# Patient Record
Sex: Male | Born: 1940 | Race: White | Hispanic: No | Marital: Married | Smoking: Never smoker
Health system: Southern US, Community
[De-identification: ages and names within clinical notes are randomized; demographics above are authoritative.]

## PROBLEM LIST (undated history)

## (undated) DIAGNOSIS — I639 Cerebral infarction, unspecified: Secondary | ICD-10-CM

## (undated) DIAGNOSIS — E7800 Pure hypercholesterolemia, unspecified: Secondary | ICD-10-CM

## (undated) DIAGNOSIS — I10 Essential (primary) hypertension: Secondary | ICD-10-CM

## (undated) DIAGNOSIS — E119 Type 2 diabetes mellitus without complications: Secondary | ICD-10-CM

## (undated) DIAGNOSIS — N400 Enlarged prostate without lower urinary tract symptoms: Secondary | ICD-10-CM

## (undated) HISTORY — PX: EXPLORATORY LAPAROTOMY: SUR591

---
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
## 2016-04-22 ENCOUNTER — Other Ambulatory Visit (HOSPITAL_COMMUNITY): Payer: Self-pay | Admitting: Interventional Radiology

## 2016-04-22 DIAGNOSIS — I771 Stricture of artery: Secondary | ICD-10-CM

## 2016-04-28 ENCOUNTER — Ambulatory Visit (HOSPITAL_COMMUNITY)
Admission: RE | Admit: 2016-04-28 | Discharge: 2016-04-28 | Disposition: A | Discharge status: Home / Self Care | Payer: Medicare HMO | Source: Ambulatory Visit | Attending: Interventional Radiology | Admitting: Interventional Radiology

## 2016-04-28 DIAGNOSIS — I771 Stricture of artery: Secondary | ICD-10-CM

## 2016-04-28 HISTORY — PX: IR GENERIC HISTORICAL: IMG1180011

## 2016-05-01 ENCOUNTER — Encounter (HOSPITAL_COMMUNITY): Payer: Self-pay | Admitting: Interventional Radiology

## 2016-08-28 DIAGNOSIS — R140 Abdominal distension (gaseous): Secondary | ICD-10-CM | POA: Diagnosis not present

## 2016-08-28 DIAGNOSIS — K7689 Other specified diseases of liver: Secondary | ICD-10-CM | POA: Diagnosis not present

## 2016-08-28 DIAGNOSIS — R1314 Dysphagia, pharyngoesophageal phase: Secondary | ICD-10-CM | POA: Diagnosis not present

## 2016-09-28 DIAGNOSIS — R69 Illness, unspecified: Secondary | ICD-10-CM | POA: Diagnosis not present

## 2016-11-05 DIAGNOSIS — E669 Obesity, unspecified: Secondary | ICD-10-CM | POA: Diagnosis not present

## 2016-11-05 DIAGNOSIS — Z972 Presence of dental prosthetic device (complete) (partial): Secondary | ICD-10-CM | POA: Diagnosis not present

## 2016-11-05 DIAGNOSIS — K08409 Partial loss of teeth, unspecified cause, unspecified class: Secondary | ICD-10-CM | POA: Diagnosis not present

## 2016-11-05 DIAGNOSIS — E1122 Type 2 diabetes mellitus with diabetic chronic kidney disease: Secondary | ICD-10-CM | POA: Diagnosis not present

## 2016-11-05 DIAGNOSIS — Z0000 Encounter for general adult medical examination without abnormal findings: Secondary | ICD-10-CM | POA: Diagnosis not present

## 2016-11-05 DIAGNOSIS — I10 Essential (primary) hypertension: Secondary | ICD-10-CM | POA: Diagnosis not present

## 2016-11-05 DIAGNOSIS — Z8673 Personal history of transient ischemic attack (TIA), and cerebral infarction without residual deficits: Secondary | ICD-10-CM | POA: Diagnosis not present

## 2016-11-05 DIAGNOSIS — I779 Disorder of arteries and arterioles, unspecified: Secondary | ICD-10-CM | POA: Diagnosis not present

## 2016-11-05 DIAGNOSIS — Z789 Other specified health status: Secondary | ICD-10-CM | POA: Diagnosis not present

## 2016-11-05 DIAGNOSIS — Z7902 Long term (current) use of antithrombotics/antiplatelets: Secondary | ICD-10-CM | POA: Diagnosis not present

## 2016-11-05 DIAGNOSIS — Z7984 Long term (current) use of oral hypoglycemic drugs: Secondary | ICD-10-CM | POA: Diagnosis not present

## 2016-11-05 DIAGNOSIS — I63212 Cerebral infarction due to unspecified occlusion or stenosis of left vertebral arteries: Secondary | ICD-10-CM | POA: Diagnosis not present

## 2016-11-05 DIAGNOSIS — R972 Elevated prostate specific antigen [PSA]: Secondary | ICD-10-CM | POA: Diagnosis not present

## 2016-11-05 DIAGNOSIS — N401 Enlarged prostate with lower urinary tract symptoms: Secondary | ICD-10-CM | POA: Diagnosis not present

## 2016-11-05 DIAGNOSIS — I6322 Cerebral infarction due to unspecified occlusion or stenosis of basilar arteries: Secondary | ICD-10-CM | POA: Diagnosis not present

## 2016-11-05 DIAGNOSIS — Z7982 Long term (current) use of aspirin: Secondary | ICD-10-CM | POA: Diagnosis not present

## 2016-11-05 DIAGNOSIS — Z713 Dietary counseling and surveillance: Secondary | ICD-10-CM | POA: Diagnosis not present

## 2016-11-05 DIAGNOSIS — Z6832 Body mass index (BMI) 32.0-32.9, adult: Secondary | ICD-10-CM | POA: Diagnosis not present

## 2016-11-05 DIAGNOSIS — N182 Chronic kidney disease, stage 2 (mild): Secondary | ICD-10-CM | POA: Diagnosis not present

## 2016-11-05 DIAGNOSIS — E119 Type 2 diabetes mellitus without complications: Secondary | ICD-10-CM | POA: Diagnosis not present

## 2016-11-05 DIAGNOSIS — Z299 Encounter for prophylactic measures, unspecified: Secondary | ICD-10-CM | POA: Diagnosis not present

## 2017-01-27 DIAGNOSIS — R69 Illness, unspecified: Secondary | ICD-10-CM | POA: Diagnosis not present

## 2017-02-11 DIAGNOSIS — N400 Enlarged prostate without lower urinary tract symptoms: Secondary | ICD-10-CM | POA: Diagnosis not present

## 2017-02-11 DIAGNOSIS — E1165 Type 2 diabetes mellitus with hyperglycemia: Secondary | ICD-10-CM | POA: Diagnosis not present

## 2017-02-11 DIAGNOSIS — Z299 Encounter for prophylactic measures, unspecified: Secondary | ICD-10-CM | POA: Diagnosis not present

## 2017-02-11 DIAGNOSIS — R972 Elevated prostate specific antigen [PSA]: Secondary | ICD-10-CM | POA: Diagnosis not present

## 2017-02-11 DIAGNOSIS — R69 Illness, unspecified: Secondary | ICD-10-CM | POA: Diagnosis not present

## 2017-02-11 DIAGNOSIS — I779 Disorder of arteries and arterioles, unspecified: Secondary | ICD-10-CM | POA: Diagnosis not present

## 2017-02-11 DIAGNOSIS — I63212 Cerebral infarction due to unspecified occlusion or stenosis of left vertebral arteries: Secondary | ICD-10-CM | POA: Diagnosis not present

## 2017-02-11 DIAGNOSIS — E7800 Pure hypercholesterolemia, unspecified: Secondary | ICD-10-CM | POA: Diagnosis not present

## 2017-02-11 DIAGNOSIS — I6322 Cerebral infarction due to unspecified occlusion or stenosis of basilar arteries: Secondary | ICD-10-CM | POA: Diagnosis not present

## 2017-02-11 DIAGNOSIS — Z6831 Body mass index (BMI) 31.0-31.9, adult: Secondary | ICD-10-CM | POA: Diagnosis not present

## 2017-03-13 DIAGNOSIS — R69 Illness, unspecified: Secondary | ICD-10-CM | POA: Diagnosis not present

## 2017-04-20 DIAGNOSIS — I63212 Cerebral infarction due to unspecified occlusion or stenosis of left vertebral arteries: Secondary | ICD-10-CM | POA: Diagnosis not present

## 2017-04-20 DIAGNOSIS — R5383 Other fatigue: Secondary | ICD-10-CM | POA: Diagnosis not present

## 2017-04-20 DIAGNOSIS — E7800 Pure hypercholesterolemia, unspecified: Secondary | ICD-10-CM | POA: Diagnosis not present

## 2017-04-20 DIAGNOSIS — I779 Disorder of arteries and arterioles, unspecified: Secondary | ICD-10-CM | POA: Diagnosis not present

## 2017-04-20 DIAGNOSIS — Z125 Encounter for screening for malignant neoplasm of prostate: Secondary | ICD-10-CM | POA: Diagnosis not present

## 2017-04-20 DIAGNOSIS — Z6831 Body mass index (BMI) 31.0-31.9, adult: Secondary | ICD-10-CM | POA: Diagnosis not present

## 2017-04-20 DIAGNOSIS — Z79899 Other long term (current) drug therapy: Secondary | ICD-10-CM | POA: Diagnosis not present

## 2017-04-20 DIAGNOSIS — I6322 Cerebral infarction due to unspecified occlusion or stenosis of basilar arteries: Secondary | ICD-10-CM | POA: Diagnosis not present

## 2017-04-20 DIAGNOSIS — E1165 Type 2 diabetes mellitus with hyperglycemia: Secondary | ICD-10-CM | POA: Diagnosis not present

## 2017-04-20 DIAGNOSIS — Z1389 Encounter for screening for other disorder: Secondary | ICD-10-CM | POA: Diagnosis not present

## 2017-04-20 DIAGNOSIS — Z7189 Other specified counseling: Secondary | ICD-10-CM | POA: Diagnosis not present

## 2017-04-20 DIAGNOSIS — Z0000 Encounter for general adult medical examination without abnormal findings: Secondary | ICD-10-CM | POA: Diagnosis not present

## 2017-04-20 DIAGNOSIS — Z299 Encounter for prophylactic measures, unspecified: Secondary | ICD-10-CM | POA: Diagnosis not present

## 2017-04-22 DIAGNOSIS — J9811 Atelectasis: Secondary | ICD-10-CM | POA: Diagnosis not present

## 2017-04-22 DIAGNOSIS — K9189 Other postprocedural complications and disorders of digestive system: Secondary | ICD-10-CM | POA: Diagnosis not present

## 2017-04-22 DIAGNOSIS — Z7984 Long term (current) use of oral hypoglycemic drugs: Secondary | ICD-10-CM | POA: Diagnosis not present

## 2017-04-22 DIAGNOSIS — R1111 Vomiting without nausea: Secondary | ICD-10-CM | POA: Diagnosis not present

## 2017-04-22 DIAGNOSIS — Z7982 Long term (current) use of aspirin: Secondary | ICD-10-CM | POA: Diagnosis not present

## 2017-04-22 DIAGNOSIS — R112 Nausea with vomiting, unspecified: Secondary | ICD-10-CM | POA: Diagnosis not present

## 2017-04-22 DIAGNOSIS — Z79899 Other long term (current) drug therapy: Secondary | ICD-10-CM | POA: Diagnosis not present

## 2017-04-22 DIAGNOSIS — R140 Abdominal distension (gaseous): Secondary | ICD-10-CM | POA: Diagnosis not present

## 2017-04-22 DIAGNOSIS — N179 Acute kidney failure, unspecified: Secondary | ICD-10-CM | POA: Diagnosis not present

## 2017-04-22 DIAGNOSIS — E119 Type 2 diabetes mellitus without complications: Secondary | ICD-10-CM | POA: Diagnosis not present

## 2017-04-22 DIAGNOSIS — I10 Essential (primary) hypertension: Secondary | ICD-10-CM | POA: Diagnosis not present

## 2017-04-22 DIAGNOSIS — K56609 Unspecified intestinal obstruction, unspecified as to partial versus complete obstruction: Secondary | ICD-10-CM | POA: Diagnosis not present

## 2017-04-22 DIAGNOSIS — Z452 Encounter for adjustment and management of vascular access device: Secondary | ICD-10-CM | POA: Diagnosis not present

## 2017-04-22 DIAGNOSIS — K449 Diaphragmatic hernia without obstruction or gangrene: Secondary | ICD-10-CM | POA: Diagnosis not present

## 2017-04-22 DIAGNOSIS — R1084 Generalized abdominal pain: Secondary | ICD-10-CM | POA: Diagnosis not present

## 2017-04-22 DIAGNOSIS — K567 Ileus, unspecified: Secondary | ICD-10-CM | POA: Diagnosis not present

## 2017-04-22 DIAGNOSIS — K3189 Other diseases of stomach and duodenum: Secondary | ICD-10-CM | POA: Diagnosis not present

## 2017-04-22 DIAGNOSIS — Q430 Meckel's diverticulum (displaced) (hypertrophic): Secondary | ICD-10-CM | POA: Diagnosis not present

## 2017-04-22 DIAGNOSIS — K565 Intestinal adhesions [bands], unspecified as to partial versus complete obstruction: Secondary | ICD-10-CM | POA: Diagnosis not present

## 2017-05-09 DIAGNOSIS — I10 Essential (primary) hypertension: Secondary | ICD-10-CM | POA: Diagnosis not present

## 2017-05-09 DIAGNOSIS — Z48815 Encounter for surgical aftercare following surgery on the digestive system: Secondary | ICD-10-CM | POA: Diagnosis not present

## 2017-05-09 DIAGNOSIS — Z8673 Personal history of transient ischemic attack (TIA), and cerebral infarction without residual deficits: Secondary | ICD-10-CM | POA: Diagnosis not present

## 2017-05-09 DIAGNOSIS — Z7982 Long term (current) use of aspirin: Secondary | ICD-10-CM | POA: Diagnosis not present

## 2017-05-09 DIAGNOSIS — Q430 Meckel's diverticulum (displaced) (hypertrophic): Secondary | ICD-10-CM | POA: Diagnosis not present

## 2017-05-09 DIAGNOSIS — Z7984 Long term (current) use of oral hypoglycemic drugs: Secondary | ICD-10-CM | POA: Diagnosis not present

## 2017-05-09 DIAGNOSIS — Z79891 Long term (current) use of opiate analgesic: Secondary | ICD-10-CM | POA: Diagnosis not present

## 2017-05-10 DIAGNOSIS — Z79891 Long term (current) use of opiate analgesic: Secondary | ICD-10-CM | POA: Diagnosis not present

## 2017-05-10 DIAGNOSIS — R69 Illness, unspecified: Secondary | ICD-10-CM | POA: Diagnosis not present

## 2017-05-10 DIAGNOSIS — Z8673 Personal history of transient ischemic attack (TIA), and cerebral infarction without residual deficits: Secondary | ICD-10-CM | POA: Diagnosis not present

## 2017-05-10 DIAGNOSIS — Z7982 Long term (current) use of aspirin: Secondary | ICD-10-CM | POA: Diagnosis not present

## 2017-05-10 DIAGNOSIS — I10 Essential (primary) hypertension: Secondary | ICD-10-CM | POA: Diagnosis not present

## 2017-05-10 DIAGNOSIS — Z7984 Long term (current) use of oral hypoglycemic drugs: Secondary | ICD-10-CM | POA: Diagnosis not present

## 2017-05-10 DIAGNOSIS — Z48815 Encounter for surgical aftercare following surgery on the digestive system: Secondary | ICD-10-CM | POA: Diagnosis not present

## 2017-05-10 DIAGNOSIS — Q430 Meckel's diverticulum (displaced) (hypertrophic): Secondary | ICD-10-CM | POA: Diagnosis not present

## 2017-05-11 DIAGNOSIS — I10 Essential (primary) hypertension: Secondary | ICD-10-CM | POA: Diagnosis not present

## 2017-05-11 DIAGNOSIS — Z7982 Long term (current) use of aspirin: Secondary | ICD-10-CM | POA: Diagnosis not present

## 2017-05-11 DIAGNOSIS — Q430 Meckel's diverticulum (displaced) (hypertrophic): Secondary | ICD-10-CM | POA: Diagnosis not present

## 2017-05-11 DIAGNOSIS — Z8673 Personal history of transient ischemic attack (TIA), and cerebral infarction without residual deficits: Secondary | ICD-10-CM | POA: Diagnosis not present

## 2017-05-11 DIAGNOSIS — Z48815 Encounter for surgical aftercare following surgery on the digestive system: Secondary | ICD-10-CM | POA: Diagnosis not present

## 2017-05-11 DIAGNOSIS — Z7984 Long term (current) use of oral hypoglycemic drugs: Secondary | ICD-10-CM | POA: Diagnosis not present

## 2017-05-11 DIAGNOSIS — Z79891 Long term (current) use of opiate analgesic: Secondary | ICD-10-CM | POA: Diagnosis not present

## 2017-05-12 DIAGNOSIS — I10 Essential (primary) hypertension: Secondary | ICD-10-CM | POA: Diagnosis not present

## 2017-05-12 DIAGNOSIS — Z299 Encounter for prophylactic measures, unspecified: Secondary | ICD-10-CM | POA: Diagnosis not present

## 2017-05-12 DIAGNOSIS — K5900 Constipation, unspecified: Secondary | ICD-10-CM | POA: Diagnosis not present

## 2017-05-12 DIAGNOSIS — I63212 Cerebral infarction due to unspecified occlusion or stenosis of left vertebral arteries: Secondary | ICD-10-CM | POA: Diagnosis not present

## 2017-05-12 DIAGNOSIS — I6322 Cerebral infarction due to unspecified occlusion or stenosis of basilar arteries: Secondary | ICD-10-CM | POA: Diagnosis not present

## 2017-05-12 DIAGNOSIS — K56609 Unspecified intestinal obstruction, unspecified as to partial versus complete obstruction: Secondary | ICD-10-CM | POA: Diagnosis not present

## 2017-05-12 DIAGNOSIS — Z6829 Body mass index (BMI) 29.0-29.9, adult: Secondary | ICD-10-CM | POA: Diagnosis not present

## 2017-05-12 DIAGNOSIS — N400 Enlarged prostate without lower urinary tract symptoms: Secondary | ICD-10-CM | POA: Diagnosis not present

## 2017-05-12 DIAGNOSIS — E7800 Pure hypercholesterolemia, unspecified: Secondary | ICD-10-CM | POA: Diagnosis not present

## 2017-05-12 DIAGNOSIS — E1165 Type 2 diabetes mellitus with hyperglycemia: Secondary | ICD-10-CM | POA: Diagnosis not present

## 2017-05-19 DIAGNOSIS — Z79891 Long term (current) use of opiate analgesic: Secondary | ICD-10-CM | POA: Diagnosis not present

## 2017-05-19 DIAGNOSIS — Z7982 Long term (current) use of aspirin: Secondary | ICD-10-CM | POA: Diagnosis not present

## 2017-05-19 DIAGNOSIS — Z7984 Long term (current) use of oral hypoglycemic drugs: Secondary | ICD-10-CM | POA: Diagnosis not present

## 2017-05-19 DIAGNOSIS — Z48815 Encounter for surgical aftercare following surgery on the digestive system: Secondary | ICD-10-CM | POA: Diagnosis not present

## 2017-05-19 DIAGNOSIS — Z8673 Personal history of transient ischemic attack (TIA), and cerebral infarction without residual deficits: Secondary | ICD-10-CM | POA: Diagnosis not present

## 2017-05-19 DIAGNOSIS — I10 Essential (primary) hypertension: Secondary | ICD-10-CM | POA: Diagnosis not present

## 2017-05-19 DIAGNOSIS — Q430 Meckel's diverticulum (displaced) (hypertrophic): Secondary | ICD-10-CM | POA: Diagnosis not present

## 2017-06-14 DIAGNOSIS — I10 Essential (primary) hypertension: Secondary | ICD-10-CM | POA: Diagnosis not present

## 2017-06-14 DIAGNOSIS — B029 Zoster without complications: Secondary | ICD-10-CM | POA: Diagnosis not present

## 2017-06-14 DIAGNOSIS — Z299 Encounter for prophylactic measures, unspecified: Secondary | ICD-10-CM | POA: Diagnosis not present

## 2017-06-14 DIAGNOSIS — E1165 Type 2 diabetes mellitus with hyperglycemia: Secondary | ICD-10-CM | POA: Diagnosis not present

## 2017-06-14 DIAGNOSIS — Z6828 Body mass index (BMI) 28.0-28.9, adult: Secondary | ICD-10-CM | POA: Diagnosis not present

## 2017-07-24 DIAGNOSIS — R69 Illness, unspecified: Secondary | ICD-10-CM | POA: Diagnosis not present

## 2017-08-17 DIAGNOSIS — R531 Weakness: Secondary | ICD-10-CM

## 2017-08-17 DIAGNOSIS — I639 Cerebral infarction, unspecified: Secondary | ICD-10-CM

## 2017-08-17 HISTORY — DX: Cerebral infarction, unspecified: I63.9

## 2017-09-02 DIAGNOSIS — Z809 Family history of malignant neoplasm, unspecified: Secondary | ICD-10-CM | POA: Diagnosis not present

## 2017-09-02 DIAGNOSIS — Z7982 Long term (current) use of aspirin: Secondary | ICD-10-CM | POA: Diagnosis not present

## 2017-09-02 DIAGNOSIS — E119 Type 2 diabetes mellitus without complications: Secondary | ICD-10-CM | POA: Diagnosis not present

## 2017-09-02 DIAGNOSIS — Z7902 Long term (current) use of antithrombotics/antiplatelets: Secondary | ICD-10-CM | POA: Diagnosis not present

## 2017-09-02 DIAGNOSIS — Z833 Family history of diabetes mellitus: Secondary | ICD-10-CM | POA: Diagnosis not present

## 2017-09-02 DIAGNOSIS — E785 Hyperlipidemia, unspecified: Secondary | ICD-10-CM | POA: Diagnosis not present

## 2017-09-02 DIAGNOSIS — I10 Essential (primary) hypertension: Secondary | ICD-10-CM | POA: Diagnosis not present

## 2017-09-02 DIAGNOSIS — N400 Enlarged prostate without lower urinary tract symptoms: Secondary | ICD-10-CM | POA: Diagnosis not present

## 2017-09-02 DIAGNOSIS — R69 Illness, unspecified: Secondary | ICD-10-CM | POA: Diagnosis not present

## 2017-09-02 DIAGNOSIS — Z7984 Long term (current) use of oral hypoglycemic drugs: Secondary | ICD-10-CM | POA: Diagnosis not present

## 2017-09-20 DIAGNOSIS — I779 Disorder of arteries and arterioles, unspecified: Secondary | ICD-10-CM | POA: Diagnosis not present

## 2017-09-20 DIAGNOSIS — E1165 Type 2 diabetes mellitus with hyperglycemia: Secondary | ICD-10-CM | POA: Diagnosis not present

## 2017-09-20 DIAGNOSIS — K56609 Unspecified intestinal obstruction, unspecified as to partial versus complete obstruction: Secondary | ICD-10-CM | POA: Diagnosis not present

## 2017-09-20 DIAGNOSIS — Z6829 Body mass index (BMI) 29.0-29.9, adult: Secondary | ICD-10-CM | POA: Diagnosis not present

## 2017-09-20 DIAGNOSIS — Z299 Encounter for prophylactic measures, unspecified: Secondary | ICD-10-CM | POA: Diagnosis not present

## 2017-09-20 DIAGNOSIS — B029 Zoster without complications: Secondary | ICD-10-CM | POA: Diagnosis not present

## 2017-09-22 DIAGNOSIS — R69 Illness, unspecified: Secondary | ICD-10-CM | POA: Diagnosis not present

## 2017-11-04 DIAGNOSIS — R69 Illness, unspecified: Secondary | ICD-10-CM | POA: Diagnosis not present

## 2017-12-23 DIAGNOSIS — R69 Illness, unspecified: Secondary | ICD-10-CM | POA: Diagnosis not present

## 2017-12-27 DIAGNOSIS — I779 Disorder of arteries and arterioles, unspecified: Secondary | ICD-10-CM | POA: Diagnosis not present

## 2017-12-27 DIAGNOSIS — E1165 Type 2 diabetes mellitus with hyperglycemia: Secondary | ICD-10-CM | POA: Diagnosis not present

## 2017-12-27 DIAGNOSIS — K56609 Unspecified intestinal obstruction, unspecified as to partial versus complete obstruction: Secondary | ICD-10-CM | POA: Diagnosis not present

## 2017-12-27 DIAGNOSIS — I10 Essential (primary) hypertension: Secondary | ICD-10-CM | POA: Diagnosis not present

## 2017-12-27 DIAGNOSIS — Z299 Encounter for prophylactic measures, unspecified: Secondary | ICD-10-CM | POA: Diagnosis not present

## 2017-12-27 DIAGNOSIS — Z6829 Body mass index (BMI) 29.0-29.9, adult: Secondary | ICD-10-CM | POA: Diagnosis not present

## 2017-12-28 DIAGNOSIS — R69 Illness, unspecified: Secondary | ICD-10-CM | POA: Diagnosis not present

## 2018-01-06 DIAGNOSIS — H52221 Regular astigmatism, right eye: Secondary | ICD-10-CM | POA: Diagnosis not present

## 2018-01-06 DIAGNOSIS — H524 Presbyopia: Secondary | ICD-10-CM | POA: Diagnosis not present

## 2018-01-06 DIAGNOSIS — H02403 Unspecified ptosis of bilateral eyelids: Secondary | ICD-10-CM | POA: Diagnosis not present

## 2018-01-06 DIAGNOSIS — E119 Type 2 diabetes mellitus without complications: Secondary | ICD-10-CM | POA: Diagnosis not present

## 2018-01-06 DIAGNOSIS — H5212 Myopia, left eye: Secondary | ICD-10-CM | POA: Diagnosis not present

## 2018-01-06 DIAGNOSIS — H2513 Age-related nuclear cataract, bilateral: Secondary | ICD-10-CM | POA: Diagnosis not present

## 2018-01-06 DIAGNOSIS — H25013 Cortical age-related cataract, bilateral: Secondary | ICD-10-CM | POA: Diagnosis not present

## 2018-01-06 DIAGNOSIS — Z7984 Long term (current) use of oral hypoglycemic drugs: Secondary | ICD-10-CM | POA: Diagnosis not present

## 2018-01-06 DIAGNOSIS — H25813 Combined forms of age-related cataract, bilateral: Secondary | ICD-10-CM | POA: Diagnosis not present

## 2018-02-07 DIAGNOSIS — R69 Illness, unspecified: Secondary | ICD-10-CM | POA: Diagnosis not present

## 2018-02-24 DIAGNOSIS — H25813 Combined forms of age-related cataract, bilateral: Secondary | ICD-10-CM | POA: Diagnosis not present

## 2018-02-24 DIAGNOSIS — E113313 Type 2 diabetes mellitus with moderate nonproliferative diabetic retinopathy with macular edema, bilateral: Secondary | ICD-10-CM | POA: Diagnosis not present

## 2018-03-02 DIAGNOSIS — R69 Illness, unspecified: Secondary | ICD-10-CM | POA: Diagnosis not present

## 2018-03-08 DIAGNOSIS — R69 Illness, unspecified: Secondary | ICD-10-CM | POA: Diagnosis not present

## 2018-03-14 DIAGNOSIS — H35033 Hypertensive retinopathy, bilateral: Secondary | ICD-10-CM | POA: Diagnosis not present

## 2018-03-14 DIAGNOSIS — E113413 Type 2 diabetes mellitus with severe nonproliferative diabetic retinopathy with macular edema, bilateral: Secondary | ICD-10-CM | POA: Diagnosis not present

## 2018-03-14 DIAGNOSIS — H3563 Retinal hemorrhage, bilateral: Secondary | ICD-10-CM | POA: Diagnosis not present

## 2018-03-14 DIAGNOSIS — H43813 Vitreous degeneration, bilateral: Secondary | ICD-10-CM | POA: Diagnosis not present

## 2018-03-21 DIAGNOSIS — R69 Illness, unspecified: Secondary | ICD-10-CM | POA: Diagnosis not present

## 2018-03-31 DIAGNOSIS — R69 Illness, unspecified: Secondary | ICD-10-CM | POA: Diagnosis not present

## 2018-04-01 DIAGNOSIS — E113413 Type 2 diabetes mellitus with severe nonproliferative diabetic retinopathy with macular edema, bilateral: Secondary | ICD-10-CM | POA: Diagnosis not present

## 2018-04-04 DIAGNOSIS — E1165 Type 2 diabetes mellitus with hyperglycemia: Secondary | ICD-10-CM | POA: Diagnosis not present

## 2018-04-04 DIAGNOSIS — I779 Disorder of arteries and arterioles, unspecified: Secondary | ICD-10-CM | POA: Diagnosis not present

## 2018-04-04 DIAGNOSIS — Z6830 Body mass index (BMI) 30.0-30.9, adult: Secondary | ICD-10-CM | POA: Diagnosis not present

## 2018-04-04 DIAGNOSIS — Z299 Encounter for prophylactic measures, unspecified: Secondary | ICD-10-CM | POA: Diagnosis not present

## 2018-04-04 DIAGNOSIS — I10 Essential (primary) hypertension: Secondary | ICD-10-CM | POA: Diagnosis not present

## 2018-04-04 DIAGNOSIS — E7800 Pure hypercholesterolemia, unspecified: Secondary | ICD-10-CM | POA: Diagnosis not present

## 2018-04-25 DIAGNOSIS — Z299 Encounter for prophylactic measures, unspecified: Secondary | ICD-10-CM | POA: Diagnosis not present

## 2018-04-25 DIAGNOSIS — Z125 Encounter for screening for malignant neoplasm of prostate: Secondary | ICD-10-CM | POA: Diagnosis not present

## 2018-04-25 DIAGNOSIS — Z1339 Encounter for screening examination for other mental health and behavioral disorders: Secondary | ICD-10-CM | POA: Diagnosis not present

## 2018-04-25 DIAGNOSIS — Z7189 Other specified counseling: Secondary | ICD-10-CM | POA: Diagnosis not present

## 2018-04-25 DIAGNOSIS — Z1211 Encounter for screening for malignant neoplasm of colon: Secondary | ICD-10-CM | POA: Diagnosis not present

## 2018-04-25 DIAGNOSIS — E7800 Pure hypercholesterolemia, unspecified: Secondary | ICD-10-CM | POA: Diagnosis not present

## 2018-04-25 DIAGNOSIS — Z79899 Other long term (current) drug therapy: Secondary | ICD-10-CM | POA: Diagnosis not present

## 2018-04-25 DIAGNOSIS — R5383 Other fatigue: Secondary | ICD-10-CM | POA: Diagnosis not present

## 2018-04-25 DIAGNOSIS — Z6829 Body mass index (BMI) 29.0-29.9, adult: Secondary | ICD-10-CM | POA: Diagnosis not present

## 2018-04-25 DIAGNOSIS — Z0000 Encounter for general adult medical examination without abnormal findings: Secondary | ICD-10-CM | POA: Diagnosis not present

## 2018-04-25 DIAGNOSIS — I10 Essential (primary) hypertension: Secondary | ICD-10-CM | POA: Diagnosis not present

## 2018-04-25 DIAGNOSIS — Z1331 Encounter for screening for depression: Secondary | ICD-10-CM | POA: Diagnosis not present

## 2018-04-29 DIAGNOSIS — H3563 Retinal hemorrhage, bilateral: Secondary | ICD-10-CM | POA: Diagnosis not present

## 2018-04-29 DIAGNOSIS — E113413 Type 2 diabetes mellitus with severe nonproliferative diabetic retinopathy with macular edema, bilateral: Secondary | ICD-10-CM | POA: Diagnosis not present

## 2018-04-29 DIAGNOSIS — H3582 Retinal ischemia: Secondary | ICD-10-CM | POA: Diagnosis not present

## 2018-04-29 DIAGNOSIS — H35033 Hypertensive retinopathy, bilateral: Secondary | ICD-10-CM | POA: Diagnosis not present

## 2018-05-02 DIAGNOSIS — I10 Essential (primary) hypertension: Secondary | ICD-10-CM | POA: Diagnosis not present

## 2018-05-02 DIAGNOSIS — Z713 Dietary counseling and surveillance: Secondary | ICD-10-CM | POA: Diagnosis not present

## 2018-05-02 DIAGNOSIS — R972 Elevated prostate specific antigen [PSA]: Secondary | ICD-10-CM | POA: Diagnosis not present

## 2018-05-02 DIAGNOSIS — Z299 Encounter for prophylactic measures, unspecified: Secondary | ICD-10-CM | POA: Diagnosis not present

## 2018-05-02 DIAGNOSIS — Z6830 Body mass index (BMI) 30.0-30.9, adult: Secondary | ICD-10-CM | POA: Diagnosis not present

## 2018-05-09 DIAGNOSIS — R69 Illness, unspecified: Secondary | ICD-10-CM | POA: Diagnosis not present

## 2018-05-17 DIAGNOSIS — R69 Illness, unspecified: Secondary | ICD-10-CM | POA: Diagnosis not present

## 2018-05-27 DIAGNOSIS — H3563 Retinal hemorrhage, bilateral: Secondary | ICD-10-CM | POA: Diagnosis not present

## 2018-05-27 DIAGNOSIS — H35033 Hypertensive retinopathy, bilateral: Secondary | ICD-10-CM | POA: Diagnosis not present

## 2018-05-27 DIAGNOSIS — E113413 Type 2 diabetes mellitus with severe nonproliferative diabetic retinopathy with macular edema, bilateral: Secondary | ICD-10-CM | POA: Diagnosis not present

## 2018-05-27 DIAGNOSIS — H3582 Retinal ischemia: Secondary | ICD-10-CM | POA: Diagnosis not present

## 2018-06-21 ENCOUNTER — Ambulatory Visit: Payer: Medicare HMO | Admitting: Urology

## 2018-06-21 DIAGNOSIS — R3912 Poor urinary stream: Secondary | ICD-10-CM

## 2018-06-21 DIAGNOSIS — R972 Elevated prostate specific antigen [PSA]: Secondary | ICD-10-CM

## 2018-06-21 DIAGNOSIS — N401 Enlarged prostate with lower urinary tract symptoms: Secondary | ICD-10-CM

## 2018-06-27 DIAGNOSIS — R69 Illness, unspecified: Secondary | ICD-10-CM | POA: Diagnosis not present

## 2018-07-06 DIAGNOSIS — H35033 Hypertensive retinopathy, bilateral: Secondary | ICD-10-CM | POA: Diagnosis not present

## 2018-07-06 DIAGNOSIS — E113413 Type 2 diabetes mellitus with severe nonproliferative diabetic retinopathy with macular edema, bilateral: Secondary | ICD-10-CM | POA: Diagnosis not present

## 2018-07-06 DIAGNOSIS — H43813 Vitreous degeneration, bilateral: Secondary | ICD-10-CM | POA: Diagnosis not present

## 2018-07-06 DIAGNOSIS — H3582 Retinal ischemia: Secondary | ICD-10-CM | POA: Diagnosis not present

## 2018-07-11 DIAGNOSIS — I10 Essential (primary) hypertension: Secondary | ICD-10-CM | POA: Diagnosis not present

## 2018-07-11 DIAGNOSIS — Z6830 Body mass index (BMI) 30.0-30.9, adult: Secondary | ICD-10-CM | POA: Diagnosis not present

## 2018-07-11 DIAGNOSIS — Z299 Encounter for prophylactic measures, unspecified: Secondary | ICD-10-CM | POA: Diagnosis not present

## 2018-07-11 DIAGNOSIS — R972 Elevated prostate specific antigen [PSA]: Secondary | ICD-10-CM | POA: Diagnosis not present

## 2018-07-11 DIAGNOSIS — E1165 Type 2 diabetes mellitus with hyperglycemia: Secondary | ICD-10-CM | POA: Diagnosis not present

## 2018-08-15 DIAGNOSIS — R69 Illness, unspecified: Secondary | ICD-10-CM | POA: Diagnosis not present

## 2018-08-29 DIAGNOSIS — R69 Illness, unspecified: Secondary | ICD-10-CM | POA: Diagnosis not present

## 2018-08-31 DIAGNOSIS — H35372 Puckering of macula, left eye: Secondary | ICD-10-CM | POA: Diagnosis not present

## 2018-08-31 DIAGNOSIS — E113413 Type 2 diabetes mellitus with severe nonproliferative diabetic retinopathy with macular edema, bilateral: Secondary | ICD-10-CM | POA: Diagnosis not present

## 2018-08-31 DIAGNOSIS — H35033 Hypertensive retinopathy, bilateral: Secondary | ICD-10-CM | POA: Diagnosis not present

## 2018-08-31 DIAGNOSIS — H43813 Vitreous degeneration, bilateral: Secondary | ICD-10-CM | POA: Diagnosis not present

## 2018-09-08 DIAGNOSIS — E113413 Type 2 diabetes mellitus with severe nonproliferative diabetic retinopathy with macular edema, bilateral: Secondary | ICD-10-CM | POA: Diagnosis not present

## 2018-09-08 DIAGNOSIS — H25813 Combined forms of age-related cataract, bilateral: Secondary | ICD-10-CM | POA: Diagnosis not present

## 2018-09-13 DIAGNOSIS — R69 Illness, unspecified: Secondary | ICD-10-CM | POA: Diagnosis not present

## 2018-10-04 DIAGNOSIS — R69 Illness, unspecified: Secondary | ICD-10-CM | POA: Diagnosis not present

## 2018-10-13 DIAGNOSIS — H2512 Age-related nuclear cataract, left eye: Secondary | ICD-10-CM | POA: Diagnosis not present

## 2018-10-17 ENCOUNTER — Encounter (HOSPITAL_COMMUNITY): Payer: Self-pay

## 2018-10-17 ENCOUNTER — Other Ambulatory Visit: Payer: Self-pay

## 2018-10-17 ENCOUNTER — Encounter (HOSPITAL_COMMUNITY)
Admission: RE | Admit: 2018-10-17 | Discharge: 2018-10-17 | Disposition: A | Discharge status: Home / Self Care | Payer: Medicare HMO | Source: Ambulatory Visit | Attending: Ophthalmology | Admitting: Ophthalmology

## 2018-10-17 DIAGNOSIS — Z01812 Encounter for preprocedural laboratory examination: Secondary | ICD-10-CM | POA: Diagnosis not present

## 2018-10-17 HISTORY — DX: Pure hypercholesterolemia, unspecified: E78.00

## 2018-10-17 HISTORY — DX: Essential (primary) hypertension: I10

## 2018-10-17 HISTORY — DX: Benign prostatic hyperplasia without lower urinary tract symptoms: N40.0

## 2018-10-17 HISTORY — DX: Type 2 diabetes mellitus without complications: E11.9

## 2018-10-17 LAB — BASIC METABOLIC PANEL
Anion gap: 7 (ref 5–15)
BUN: 26 mg/dL — AB (ref 8–23)
CO2: 23 mmol/L (ref 22–32)
Calcium: 8.9 mg/dL (ref 8.9–10.3)
Chloride: 107 mmol/L (ref 98–111)
Creatinine, Ser: 1.26 mg/dL — ABNORMAL HIGH (ref 0.61–1.24)
GFR calc Af Amer: >60 mL/min (ref >60)
GFR calc non Af Amer: 54 mL/min — ABNORMAL LOW (ref >60)
Glucose, Bld: 203 mg/dL — ABNORMAL HIGH (ref 70–99)
Potassium: 4.1 mmol/L (ref 3.5–5.1)
Sodium: 137 mmol/L (ref 135–145)

## 2018-10-17 NOTE — Patient Instructions (Signed)
Your procedure is scheduled on: 10/21/2018  Report to [facility] at  74   AM.  Call this number if you have problems the morning of surgery: [phone]   Do not eat food or drink liquids :After Midnight.      Take these medicines the morning of surgery with A SIP OF WATER: amlodipine  Do not wear jewelry, make-up or nail polish.  Do not wear lotions, powders, or perfumes. You may wear deodorant.  Do not shave 48 hours prior to surgery.  Do not bring valuables to the hospital.  Contacts, dentures or bridgework may not be worn into surgery.  Leave suitcase in the car. After surgery it may be brought to your room.  For patients admitted to the hospital, checkout time is 11:00 AM the day of discharge.   Patients discharged the day of surgery will not be allowed to drive home.  :     Please read over the following fact sheets that you were given: Coughing and Deep Breathing, Surgical Site Infection Prevention, Anesthesia Post-op Instructions and Care and Recovery After Surgery    Cataract A cataract is a clouding of the lens of the eye. When a lens becomes cloudy, vision is reduced based on the degree and nature of the clouding. Many cataracts reduce vision to some degree. Some cataracts make people more near-sighted as they develop. Other cataracts increase glare. Cataracts that are ignored and become worse can sometimes look white. The white color can be seen through the pupil. CAUSES   Aging. However, cataracts may occur at any age, even in newborns.   Certain drugs.   Trauma to the eye.   Certain diseases such as diabetes.   Specific eye diseases such as chronic inflammation inside the eye or a sudden attack of a rare form of glaucoma.   Inherited or acquired medical problems.  SYMPTOMS   Gradual, progressive drop in vision in the affected eye.   Severe, rapid visual loss. This most often happens when trauma is the cause.  DIAGNOSIS  To detect a cataract, an eye doctor  examines the lens. Cataracts are best diagnosed with an exam of the eyes with the pupils enlarged (dilated) by drops.  TREATMENT  For an early cataract, vision may improve by using different eyeglasses or stronger lighting. If that does not help your vision, surgery is the only effective treatment. A cataract needs to be surgically removed when vision loss interferes with your everyday activities, such as driving, reading, or watching TV. A cataract may also have to be removed if it prevents examination or treatment of another eye problem. Surgery removes the cloudy lens and usually replaces it with a substitute lens (intraocular lens, IOL).  At a time when both you and your doctor agree, the cataract will be surgically removed. If you have cataracts in both eyes, only one is usually removed at a time. This allows the operated eye to heal and be out of danger from any possible problems after surgery (such as infection or poor wound healing). In rare cases, a cataract may be doing damage to your eye. In these cases, your caregiver may advise surgical removal right away. The vast majority of people who have cataract surgery have better vision afterward. HOME CARE INSTRUCTIONS  If you are not planning surgery, you may be asked to do the following:  Use different eyeglasses.   Use stronger or brighter lighting.   Ask your eye doctor about reducing your medicine dose or  changing medicines if it is thought that a medicine caused your cataract. Changing medicines does not make the cataract go away on its own.   Become familiar with your surroundings. Poor vision can lead to injury. Avoid bumping into things on the affected side. You are at a higher risk for tripping or falling.   Exercise extreme care when driving or operating machinery.   Wear sunglasses if you are sensitive to bright light or experiencing problems with glare.  SEEK IMMEDIATE MEDICAL CARE IF:   You have a worsening or sudden vision  loss.   You notice redness, swelling, or increasing pain in the eye.   You have a fever.  Document Released: 08/03/2005 Document Revised: 07/23/2011 Document Reviewed: 03/27/2011 [facility] Patient Information 2012 [place].PATIENT INSTRUCTIONS POST-ANESTHESIA  IMMEDIATELY FOLLOWING SURGERY:  Do not drive or operate machinery for the first twenty four hours after surgery.  Do not make any important decisions for twenty four hours after surgery or while taking narcotic pain medications or sedatives.  If you develop intractable nausea and vomiting or a severe headache please notify your doctor immediately.  FOLLOW-UP:  Please make an appointment with your surgeon as instructed. You do not need to follow up with anesthesia unless specifically instructed to do so.  WOUND CARE INSTRUCTIONS (if applicable):  Keep a dry clean dressing on the anesthesia/puncture wound site if there is drainage.  Once the wound has quit draining you may leave it open to air.  Generally you should leave the bandage intact for twenty four hours unless there is drainage.  If the epidural site drains for more than 36-48 hours please call the anesthesia department.  QUESTIONS?:  Please feel free to call your physician or the hospital operator if you have any questions, and they will be happy to assist you.

## 2018-10-21 ENCOUNTER — Encounter (HOSPITAL_COMMUNITY)
Admission: RE | Disposition: A | Discharge status: Home / Self Care | Payer: Self-pay | Source: Home / Self Care | Attending: Ophthalmology

## 2018-10-21 ENCOUNTER — Ambulatory Visit (HOSPITAL_COMMUNITY): Payer: Medicare HMO | Admitting: Anesthesiology

## 2018-10-21 ENCOUNTER — Ambulatory Visit (HOSPITAL_COMMUNITY)
Admission: RE | Admit: 2018-10-21 | Discharge: 2018-10-21 | Disposition: A | Discharge status: Home / Self Care | Payer: Medicare HMO | Attending: Ophthalmology | Admitting: Ophthalmology

## 2018-10-21 ENCOUNTER — Encounter (HOSPITAL_COMMUNITY): Payer: Self-pay | Admitting: *Deleted

## 2018-10-21 DIAGNOSIS — E1136 Type 2 diabetes mellitus with diabetic cataract: Secondary | ICD-10-CM | POA: Diagnosis present

## 2018-10-21 DIAGNOSIS — I69398 Other sequelae of cerebral infarction: Secondary | ICD-10-CM | POA: Insufficient documentation

## 2018-10-21 DIAGNOSIS — Z7984 Long term (current) use of oral hypoglycemic drugs: Secondary | ICD-10-CM | POA: Diagnosis not present

## 2018-10-21 DIAGNOSIS — H259 Unspecified age-related cataract: Secondary | ICD-10-CM | POA: Diagnosis not present

## 2018-10-21 DIAGNOSIS — N400 Enlarged prostate without lower urinary tract symptoms: Secondary | ICD-10-CM | POA: Insufficient documentation

## 2018-10-21 DIAGNOSIS — Z79899 Other long term (current) drug therapy: Secondary | ICD-10-CM | POA: Diagnosis not present

## 2018-10-21 DIAGNOSIS — Z7982 Long term (current) use of aspirin: Secondary | ICD-10-CM | POA: Insufficient documentation

## 2018-10-21 DIAGNOSIS — R278 Other lack of coordination: Secondary | ICD-10-CM | POA: Insufficient documentation

## 2018-10-21 DIAGNOSIS — I10 Essential (primary) hypertension: Secondary | ICD-10-CM | POA: Insufficient documentation

## 2018-10-21 DIAGNOSIS — E119 Type 2 diabetes mellitus without complications: Secondary | ICD-10-CM | POA: Diagnosis not present

## 2018-10-21 DIAGNOSIS — H2512 Age-related nuclear cataract, left eye: Secondary | ICD-10-CM | POA: Diagnosis not present

## 2018-10-21 HISTORY — PX: CATARACT EXTRACTION W/PHACO: SHX586

## 2018-10-21 LAB — GLUCOSE, CAPILLARY: Glucose-Capillary: 123 mg/dL — ABNORMAL HIGH (ref 70–99)

## 2018-10-21 SURGERY — PHACOEMULSIFICATION, CATARACT, WITH IOL INSERTION
Anesthesia: Monitor Anesthesia Care | Site: Eye | Laterality: Left

## 2018-10-21 MED ORDER — NEOMYCIN-POLYMYXIN-DEXAMETH 3.5-10000-0.1 OP SUSP
OPHTHALMIC | Status: DC | PRN
  Administered 2018-10-21: 1 [drp] via OPHTHALMIC

## 2018-10-21 MED ORDER — HYDROCODONE-ACETAMINOPHEN 7.5-325 MG PO TABS: 1.0000 | ORAL_TABLET | Freq: Once | ORAL | Status: DC | PRN

## 2018-10-21 MED ORDER — CYCLOPENTOLATE-PHENYLEPHRINE 0.2-1 % OP SOLN
1.0000 [drp] | OPHTHALMIC | Status: AC
  Administered 2018-10-21 (×3): 1 [drp] via OPHTHALMIC

## 2018-10-21 MED ORDER — HYDROMORPHONE HCL 1 MG/ML IJ SOLN: 0.2500 mg | INTRAMUSCULAR | Status: DC | PRN

## 2018-10-21 MED ORDER — SODIUM HYALURONATE 23 MG/ML IO SOLN
INTRAOCULAR | Status: DC | PRN
  Administered 2018-10-21: 0.6 mL via INTRAOCULAR

## 2018-10-21 MED ORDER — PROMETHAZINE HCL 25 MG/ML IJ SOLN: 6.2500 mg | INTRAMUSCULAR | Status: DC | PRN

## 2018-10-21 MED ORDER — MIDAZOLAM HCL 2 MG/2ML IJ SOLN: 0.5000 mg | Freq: Once | INTRAMUSCULAR | Status: DC | PRN

## 2018-10-21 MED ORDER — LIDOCAINE HCL 3.5 % OP GEL
1.0000 "application " | Freq: Once | OPHTHALMIC | Status: AC
  Administered 2018-10-21: 1 via OPHTHALMIC

## 2018-10-21 MED ORDER — SODIUM CHLORIDE 0.9% FLUSH
10.0000 mL | INTRAVENOUS | Status: DC | PRN
  Administered 2018-10-21: 3 mL via INTRAVENOUS

## 2018-10-21 MED ORDER — PHENYLEPHRINE HCL 2.5 % OP SOLN
1.0000 [drp] | OPHTHALMIC | Status: AC
  Administered 2018-10-21 (×3): 1 [drp] via OPHTHALMIC

## 2018-10-21 MED ORDER — BSS IO SOLN
INTRAOCULAR | Status: DC | PRN
  Administered 2018-10-21: 15 mL

## 2018-10-21 MED ORDER — EPINEPHRINE PF 1 MG/ML IJ SOLN
INTRAMUSCULAR | Status: AC
  Filled 2018-10-21: qty 2

## 2018-10-21 MED ORDER — POVIDONE-IODINE 5 % OP SOLN
OPHTHALMIC | Status: DC | PRN
  Administered 2018-10-21: 1 via OPHTHALMIC

## 2018-10-21 MED ORDER — LIDOCAINE HCL (PF) 1 % IJ SOLN
INTRAOCULAR | Status: DC | PRN
  Administered 2018-10-21: 1 mL via OPHTHALMIC

## 2018-10-21 MED ORDER — PROVISC 10 MG/ML IO SOLN
INTRAOCULAR | Status: DC | PRN
  Administered 2018-10-21: 0.85 mL via INTRAOCULAR

## 2018-10-21 MED ORDER — EPINEPHRINE PF 1 MG/ML IJ SOLN
INTRAOCULAR | Status: DC | PRN
  Administered 2018-10-21: 500 mL

## 2018-10-21 MED ORDER — TETRACAINE HCL 0.5 % OP SOLN
1.0000 [drp] | OPHTHALMIC | Status: AC
  Administered 2018-10-21 (×3): 1 [drp] via OPHTHALMIC

## 2018-10-21 SURGICAL SUPPLY — 13 items

## 2018-10-21 NOTE — Discharge Instructions (Signed)
Please discharge patient when stable, will follow up today with Dr. Lakima Dona at the [facility] office immediately following discharge.  Leave shield in place until visit.  All paperwork with discharge instructions will be given at the office. ° ° ° ° ° °Monitored Anesthesia Care, Care After °These instructions provide you with information about caring for yourself after your procedure. Your health care provider may also give you more specific instructions. Your treatment has been planned according to current medical practices, but problems sometimes occur. Call your health care provider if you have any problems or questions after your procedure. °What can I expect after the procedure? °After your procedure, you may: °· Feel sleepy for several hours. °· Feel clumsy and have poor balance for several hours. °· Feel forgetful about what happened after the procedure. °· Have poor judgment for several hours. °· Feel nauseous or vomit. °· Have a sore throat if you had a breathing tube during the procedure. °Follow these instructions at home: °For at least 24 hours after the procedure: ° °  ° °· Have a responsible adult stay with you. It is important to have someone help care for you until you are awake and alert. °· Rest as needed. °· Do not: °? Participate in activities in which you could fall or become injured. °? Drive. °? Use heavy machinery. °? Drink alcohol. °? Take sleeping pills or medicines that cause drowsiness. °? Make important decisions or sign legal documents. °? Take care of children on your own. °Eating and drinking °· Follow the diet that is recommended by your health care provider. °· If you vomit, drink water, juice, or soup when you can drink without vomiting. °· Make sure you have little or no nausea before eating solid foods. °General instructions °· Take over-the-counter and prescription medicines only as told by your health care provider. °· If you have sleep apnea, surgery and certain  medicines can increase your risk for breathing problems. Follow instructions from your health care provider about wearing your sleep device: °? Anytime you are sleeping, including during daytime naps. °? While taking prescription pain medicines, sleeping medicines, or medicines that make you drowsy. °· If you smoke, do not smoke without supervision. °· Keep all follow-up visits as told by your health care provider. This is important. °Contact a health care provider if: °· You keep feeling nauseous or you keep vomiting. °· You feel light-headed. °· You develop a rash. °· You have a fever. °Get help right away if: °· You have trouble breathing. °Summary °· For several hours after your procedure, you may feel sleepy and have poor judgment. °· Have a responsible adult stay with you for at least 24 hours or until you are awake and alert. °This information is not intended to replace advice given to you by your health care provider. Make sure you discuss any questions you have with your health care provider. °Document Released: 11/24/2015 Document Revised: 03/19/2017 Document Reviewed: 11/24/2015 °Elsevier Interactive Patient Education © 2019 [employer] ° °

## 2018-10-21 NOTE — Op Note (Signed)
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 Kaidence Callaway, MD, MA  Anesthesia: MAC, Topical Akten  Complications: None  Estimated Blood Loss: <6m (minimal)  Specimens: None  Implants: As above  Indications:  Visually significant age-related cataract, Left Eye  Procedure:  The patient was seen and identified in the pre-operative area. The operative eye was identified and dilated.  The operative eye was marked.  Topical anesthesia was administered to the operative eye.     The patient was then to the operative suite and placed in the supine position.  A timeout was performed confirming the patient, procedure to be performed, and all other relevant information.   The patient's face was prepped and draped in the usual fashion for intra-ocular surgery.  A lid speculum was placed into the operative eye and the surgical microscope moved into place and focused.  An inferotemporal paracentesis was created using a 20 gauge paracentesis blade.  Shugarcaine was injected into the anterior chamber.  Viscoelastic was injected into the anterior chamber.  A temporal clear-corneal main wound incision was created using a 2.426mmicrokeratome.  A continuous curvilinear capsulorrhexis was initiated using an irrigating cystitome and completed using capsulorrhexis forceps.  Hydrodissection and hydrodeliniation were performed.  Viscoelastic was injected into the anterior chamber.  A phacoemulsification handpiece and a chopper as a second instrument were used to remove the nucleus and epinucleus. The irrigation/aspiration handpiece was used to remove any remaining cortical  material.   The capsular bag was reinflated with viscoelastic, checked, and found to be intact.  The intraocular lens was inserted into the capsular bag and dialed into place using a Kuglen hook.  The irrigation/aspiration handpiece was used to remove any remaining viscoelastic.  The clear corneal wound and paracentesis wounds were then hydrated and checked with Weck-Cels to be watertight.  The lid-speculum and drape was removed, and the patient's face was cleaned with a wet and dry 4x4.  Maxitrol was instilled in the eye before a clear shield was taped over the eye. The patient was taken to the post-operative care unit in good condition, having tolerated the procedure well.  Post-Op Instructions: The [facility] post[facility]operative evaluation and will receive all other orders and instructions.

## 2018-10-21 NOTE — Anesthesia Postprocedure Evaluation (Signed)
Anesthesia Post Note  Patient: Thomas Duarte  Procedure(s) Performed: CATARACT EXTRACTION PHACO AND INTRAOCULAR LENS PLACEMENT LEFT EYE (Left Eye)  Patient location during evaluation: Short Stay Anesthesia Type: MAC Level of consciousness: awake and alert and patient cooperative Pain management: satisfactory to patient Vital Signs Assessment: post-procedure vital signs reviewed and stable Respiratory status: spontaneous breathing Cardiovascular status: stable Postop Assessment: no apparent nausea or vomiting Anesthetic complications: no     Last Vitals:  Vitals:   10/21/18 0853 10/21/18 1010  BP: (!) 159/73 (!) 178/81  Pulse: (!) 56 65  Resp: 16 16  Temp: 36.5 C 36.8 C  SpO2: 100% 95%    Last Pain:  Vitals:   10/21/18 1010  TempSrc: Oral  PainSc: 0-No pain                 Anamari Galeas

## 2018-10-21 NOTE — Anesthesia Preprocedure Evaluation (Signed)
Anesthesia Evaluation  Patient identified by MRN, date of birth, ID band Patient awake    Reviewed: Allergy & Precautions, NPO status , Patient's Chart, lab work & pertinent test results  Airway Mallampati: II  TM Distance: >3 FB Neck ROM: Full    Dental no notable dental hx. (+) Edentulous Upper, Poor Dentition, Missing   Pulmonary neg pulmonary ROS,    Pulmonary exam normal breath sounds clear to auscultation       Cardiovascular Exercise Tolerance: Good hypertension, Pt. 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  States can go a distance on level ground CVA, Residual Symptoms negative psych ROS   GI/Hepatic negative GI ROS, Neg liver ROS,   Endo/Other  negative endocrine ROSdiabetes, Type 2, Oral Hypoglycemic Agents  Renal/GU negative Renal ROS  negative genitourinary   Musculoskeletal negative musculoskeletal ROS (+)   Abdominal   Peds negative pediatric ROS (+)  Hematology negative hematology ROS (+)   Anesthesia Other Findings   Reproductive/Obstetrics negative OB ROS                             Anesthesia Physical Anesthesia Plan  ASA: III  Anesthesia Plan: MAC   Post-op Pain Management:    Induction: Intravenous  PONV Risk Score and Plan:   Airway Management Planned: Nasal Cannula and Simple Face Mask  Additional Equipment:   Intra-op Plan:   Post-operative Plan:   Informed Consent: I have reviewed the patients History and Physical, chart, labs and discussed the procedure including the risks, benefits and alternatives for the proposed anesthesia with the patient or authorized representative who has indicated his/her understanding and acceptance.     Dental advisory given  Plan Discussed with: CRNA  Anesthesia Plan Comments:         Anesthesia  Quick Evaluation

## 2018-10-21 NOTE — Transfer of Care (Signed)
Immediate Anesthesia Transfer of Care Note  Patient: Thomas Duarte  Procedure(s) Performed: CATARACT EXTRACTION PHACO AND INTRAOCULAR LENS PLACEMENT LEFT EYE (Left Eye)  Patient Location: Short Stay  Anesthesia Type:MAC  Level of Consciousness: awake, alert  and patient cooperative  Airway & Oxygen Therapy: Patient Spontanous Breathing  Post-op Assessment: Report given to RN and Post -op Vital signs reviewed and stable  Post vital signs: Reviewed and stable  Last Vitals:  Vitals Value Taken Time  BP    Temp    Pulse    Resp    SpO2      Last Pain:  Vitals:   10/21/18 0853  TempSrc: Oral  PainSc: 0-No pain      Patients Stated Pain Goal: 7 (37/34/28 [id number])  Complications: No apparent anesthesia complications

## 2018-10-21 NOTE — H&P (Signed)
The H and P was reviewed and updated. The patient was examined.  No changes were found after exam.  The surgical eye was marked.  

## 2018-10-21 NOTE — Anesthesia Procedure Notes (Signed)
Procedure Name: MAC Date/Time: 10/21/2018 9:46 AM Performed by: Vista Deck, CRNA Pre-anesthesia Checklist: Patient identified, Emergency Drugs available, Suction available, Timeout performed and Patient being monitored Patient Re-evaluated:Patient Re-evaluated prior to induction Oxygen Delivery Method: Nasal Cannula

## 2018-10-24 ENCOUNTER — Encounter (HOSPITAL_COMMUNITY): Payer: Self-pay | Admitting: Ophthalmology

## 2018-10-26 DIAGNOSIS — H35033 Hypertensive retinopathy, bilateral: Secondary | ICD-10-CM | POA: Diagnosis not present

## 2018-10-26 DIAGNOSIS — H3582 Retinal ischemia: Secondary | ICD-10-CM | POA: Diagnosis not present

## 2018-10-26 DIAGNOSIS — E113413 Type 2 diabetes mellitus with severe nonproliferative diabetic retinopathy with macular edema, bilateral: Secondary | ICD-10-CM | POA: Diagnosis not present

## 2018-10-26 DIAGNOSIS — H35372 Puckering of macula, left eye: Secondary | ICD-10-CM | POA: Diagnosis not present

## 2018-10-28 ENCOUNTER — Encounter (HOSPITAL_COMMUNITY): Payer: Self-pay

## 2018-10-31 ENCOUNTER — Other Ambulatory Visit: Payer: Self-pay

## 2018-10-31 ENCOUNTER — Encounter (HOSPITAL_COMMUNITY)
Admission: RE | Admit: 2018-10-31 | Discharge: 2018-10-31 | Disposition: A | Discharge status: Home / Self Care | Payer: Medicare HMO | Source: Ambulatory Visit | Attending: Ophthalmology | Admitting: Ophthalmology

## 2018-11-01 DIAGNOSIS — I340 Nonrheumatic mitral (valve) insufficiency: Secondary | ICD-10-CM | POA: Diagnosis not present

## 2018-11-01 DIAGNOSIS — E785 Hyperlipidemia, unspecified: Secondary | ICD-10-CM | POA: Diagnosis not present

## 2018-11-01 DIAGNOSIS — Z7982 Long term (current) use of aspirin: Secondary | ICD-10-CM | POA: Diagnosis not present

## 2018-11-01 DIAGNOSIS — I6300 Cerebral infarction due to thrombosis of unspecified precerebral artery: Secondary | ICD-10-CM | POA: Diagnosis not present

## 2018-11-01 DIAGNOSIS — R29703 NIHSS score 3: Secondary | ICD-10-CM | POA: Diagnosis not present

## 2018-11-01 DIAGNOSIS — I6389 Other cerebral infarction: Secondary | ICD-10-CM | POA: Diagnosis not present

## 2018-11-01 DIAGNOSIS — I10 Essential (primary) hypertension: Secondary | ICD-10-CM | POA: Diagnosis not present

## 2018-11-01 DIAGNOSIS — I517 Cardiomegaly: Secondary | ICD-10-CM | POA: Diagnosis not present

## 2018-11-01 DIAGNOSIS — I878 Other specified disorders of veins: Secondary | ICD-10-CM | POA: Diagnosis not present

## 2018-11-01 DIAGNOSIS — I088 Other rheumatic multiple valve diseases: Secondary | ICD-10-CM | POA: Diagnosis not present

## 2018-11-01 DIAGNOSIS — G8191 Hemiplegia, unspecified affecting right dominant side: Secondary | ICD-10-CM | POA: Diagnosis not present

## 2018-11-01 DIAGNOSIS — I639 Cerebral infarction, unspecified: Secondary | ICD-10-CM | POA: Diagnosis not present

## 2018-11-01 DIAGNOSIS — Z79899 Other long term (current) drug therapy: Secondary | ICD-10-CM | POA: Diagnosis not present

## 2018-11-01 DIAGNOSIS — R202 Paresthesia of skin: Secondary | ICD-10-CM | POA: Diagnosis not present

## 2018-11-01 DIAGNOSIS — E119 Type 2 diabetes mellitus without complications: Secondary | ICD-10-CM | POA: Diagnosis not present

## 2018-11-01 DIAGNOSIS — I619 Nontraumatic intracerebral hemorrhage, unspecified: Secondary | ICD-10-CM | POA: Diagnosis not present

## 2018-11-01 DIAGNOSIS — I5189 Other ill-defined heart diseases: Secondary | ICD-10-CM | POA: Diagnosis not present

## 2018-11-01 DIAGNOSIS — I671 Cerebral aneurysm, nonruptured: Secondary | ICD-10-CM | POA: Diagnosis not present

## 2018-11-01 DIAGNOSIS — R200 Anesthesia of skin: Secondary | ICD-10-CM | POA: Diagnosis not present

## 2018-11-01 DIAGNOSIS — I519 Heart disease, unspecified: Secondary | ICD-10-CM | POA: Diagnosis not present

## 2018-11-01 DIAGNOSIS — I629 Nontraumatic intracranial hemorrhage, unspecified: Secondary | ICD-10-CM | POA: Diagnosis not present

## 2018-11-01 DIAGNOSIS — D649 Anemia, unspecified: Secondary | ICD-10-CM | POA: Diagnosis not present

## 2018-11-01 DIAGNOSIS — R29898 Other symptoms and signs involving the musculoskeletal system: Secondary | ICD-10-CM | POA: Diagnosis not present

## 2018-11-01 DIAGNOSIS — Z8673 Personal history of transient ischemic attack (TIA), and cerebral infarction without residual deficits: Secondary | ICD-10-CM | POA: Diagnosis not present

## 2018-11-01 DIAGNOSIS — I618 Other nontraumatic intracerebral hemorrhage: Secondary | ICD-10-CM | POA: Diagnosis not present

## 2018-11-01 DIAGNOSIS — Z7984 Long term (current) use of oral hypoglycemic drugs: Secondary | ICD-10-CM | POA: Diagnosis not present

## 2018-11-01 DIAGNOSIS — R5383 Other fatigue: Secondary | ICD-10-CM | POA: Diagnosis not present

## 2018-11-01 DIAGNOSIS — E1165 Type 2 diabetes mellitus with hyperglycemia: Secondary | ICD-10-CM | POA: Diagnosis not present

## 2018-11-01 DIAGNOSIS — I63312 Cerebral infarction due to thrombosis of left middle cerebral artery: Secondary | ICD-10-CM | POA: Diagnosis not present

## 2018-11-02 DIAGNOSIS — E1165 Type 2 diabetes mellitus with hyperglycemia: Secondary | ICD-10-CM | POA: Diagnosis not present

## 2018-11-02 DIAGNOSIS — I519 Heart disease, unspecified: Secondary | ICD-10-CM | POA: Diagnosis not present

## 2018-11-02 DIAGNOSIS — Z7984 Long term (current) use of oral hypoglycemic drugs: Secondary | ICD-10-CM | POA: Diagnosis not present

## 2018-11-02 DIAGNOSIS — I5189 Other ill-defined heart diseases: Secondary | ICD-10-CM | POA: Diagnosis not present

## 2018-11-02 DIAGNOSIS — I878 Other specified disorders of veins: Secondary | ICD-10-CM | POA: Diagnosis not present

## 2018-11-02 DIAGNOSIS — I629 Nontraumatic intracranial hemorrhage, unspecified: Secondary | ICD-10-CM | POA: Diagnosis not present

## 2018-11-02 DIAGNOSIS — D649 Anemia, unspecified: Secondary | ICD-10-CM | POA: Diagnosis not present

## 2018-11-02 DIAGNOSIS — G8191 Hemiplegia, unspecified affecting right dominant side: Secondary | ICD-10-CM | POA: Diagnosis not present

## 2018-11-02 DIAGNOSIS — I517 Cardiomegaly: Secondary | ICD-10-CM | POA: Diagnosis not present

## 2018-11-02 DIAGNOSIS — I63312 Cerebral infarction due to thrombosis of left middle cerebral artery: Secondary | ICD-10-CM | POA: Diagnosis not present

## 2018-11-02 DIAGNOSIS — I10 Essential (primary) hypertension: Secondary | ICD-10-CM | POA: Diagnosis not present

## 2018-11-02 DIAGNOSIS — R29703 NIHSS score 3: Secondary | ICD-10-CM | POA: Diagnosis not present

## 2018-11-02 DIAGNOSIS — E785 Hyperlipidemia, unspecified: Secondary | ICD-10-CM | POA: Diagnosis not present

## 2018-11-02 DIAGNOSIS — I340 Nonrheumatic mitral (valve) insufficiency: Secondary | ICD-10-CM | POA: Diagnosis not present

## 2018-11-02 DIAGNOSIS — I6389 Other cerebral infarction: Secondary | ICD-10-CM | POA: Diagnosis not present

## 2018-11-02 DIAGNOSIS — I619 Nontraumatic intracerebral hemorrhage, unspecified: Secondary | ICD-10-CM | POA: Diagnosis not present

## 2018-11-02 DIAGNOSIS — I671 Cerebral aneurysm, nonruptured: Secondary | ICD-10-CM | POA: Diagnosis not present

## 2018-11-03 DIAGNOSIS — I10 Essential (primary) hypertension: Secondary | ICD-10-CM | POA: Diagnosis not present

## 2018-11-03 DIAGNOSIS — E785 Hyperlipidemia, unspecified: Secondary | ICD-10-CM | POA: Diagnosis not present

## 2018-11-03 DIAGNOSIS — I63312 Cerebral infarction due to thrombosis of left middle cerebral artery: Secondary | ICD-10-CM | POA: Diagnosis not present

## 2018-11-04 ENCOUNTER — Encounter (HOSPITAL_COMMUNITY): Admission: RE | Payer: Self-pay | Source: Home / Self Care

## 2018-11-04 ENCOUNTER — Ambulatory Visit (HOSPITAL_COMMUNITY): Admission: RE | Admit: 2018-11-04 | Payer: Medicare HMO | Source: Home / Self Care | Admitting: Ophthalmology

## 2018-11-04 DIAGNOSIS — I10 Essential (primary) hypertension: Secondary | ICD-10-CM | POA: Diagnosis not present

## 2018-11-04 DIAGNOSIS — D649 Anemia, unspecified: Secondary | ICD-10-CM | POA: Diagnosis not present

## 2018-11-04 DIAGNOSIS — E1165 Type 2 diabetes mellitus with hyperglycemia: Secondary | ICD-10-CM | POA: Diagnosis not present

## 2018-11-04 DIAGNOSIS — I6389 Other cerebral infarction: Secondary | ICD-10-CM | POA: Diagnosis not present

## 2018-11-04 DIAGNOSIS — G8191 Hemiplegia, unspecified affecting right dominant side: Secondary | ICD-10-CM | POA: Diagnosis not present

## 2018-11-04 DIAGNOSIS — I517 Cardiomegaly: Secondary | ICD-10-CM | POA: Diagnosis not present

## 2018-11-04 DIAGNOSIS — I639 Cerebral infarction, unspecified: Secondary | ICD-10-CM | POA: Diagnosis not present

## 2018-11-04 DIAGNOSIS — E785 Hyperlipidemia, unspecified: Secondary | ICD-10-CM | POA: Diagnosis not present

## 2018-11-04 DIAGNOSIS — R29703 NIHSS score 3: Secondary | ICD-10-CM | POA: Diagnosis not present

## 2018-11-04 DIAGNOSIS — I088 Other rheumatic multiple valve diseases: Secondary | ICD-10-CM | POA: Diagnosis not present

## 2018-11-04 DIAGNOSIS — I629 Nontraumatic intracranial hemorrhage, unspecified: Secondary | ICD-10-CM | POA: Diagnosis not present

## 2018-11-04 DIAGNOSIS — Z7984 Long term (current) use of oral hypoglycemic drugs: Secondary | ICD-10-CM | POA: Diagnosis not present

## 2018-11-04 DIAGNOSIS — I671 Cerebral aneurysm, nonruptured: Secondary | ICD-10-CM | POA: Diagnosis not present

## 2018-11-04 SURGERY — PHACOEMULSIFICATION, CATARACT, WITH IOL INSERTION
Anesthesia: Monitor Anesthesia Care | Laterality: Right

## 2018-11-05 DIAGNOSIS — Z7982 Long term (current) use of aspirin: Secondary | ICD-10-CM | POA: Diagnosis not present

## 2018-11-05 DIAGNOSIS — Z7984 Long term (current) use of oral hypoglycemic drugs: Secondary | ICD-10-CM | POA: Diagnosis not present

## 2018-11-05 DIAGNOSIS — I69351 Hemiplegia and hemiparesis following cerebral infarction affecting right dominant side: Secondary | ICD-10-CM | POA: Diagnosis not present

## 2018-11-05 DIAGNOSIS — E119 Type 2 diabetes mellitus without complications: Secondary | ICD-10-CM | POA: Diagnosis not present

## 2018-11-05 DIAGNOSIS — I10 Essential (primary) hypertension: Secondary | ICD-10-CM | POA: Diagnosis not present

## 2018-11-08 DIAGNOSIS — I10 Essential (primary) hypertension: Secondary | ICD-10-CM | POA: Diagnosis not present

## 2018-11-08 DIAGNOSIS — I69351 Hemiplegia and hemiparesis following cerebral infarction affecting right dominant side: Secondary | ICD-10-CM | POA: Diagnosis not present

## 2018-11-08 DIAGNOSIS — Z7982 Long term (current) use of aspirin: Secondary | ICD-10-CM | POA: Diagnosis not present

## 2018-11-08 DIAGNOSIS — E119 Type 2 diabetes mellitus without complications: Secondary | ICD-10-CM | POA: Diagnosis not present

## 2018-11-08 DIAGNOSIS — Z7984 Long term (current) use of oral hypoglycemic drugs: Secondary | ICD-10-CM | POA: Diagnosis not present

## 2018-11-09 DIAGNOSIS — I639 Cerebral infarction, unspecified: Secondary | ICD-10-CM | POA: Diagnosis not present

## 2018-11-09 DIAGNOSIS — I10 Essential (primary) hypertension: Secondary | ICD-10-CM | POA: Diagnosis not present

## 2018-11-10 DIAGNOSIS — Z7984 Long term (current) use of oral hypoglycemic drugs: Secondary | ICD-10-CM | POA: Diagnosis not present

## 2018-11-10 DIAGNOSIS — I10 Essential (primary) hypertension: Secondary | ICD-10-CM | POA: Diagnosis not present

## 2018-11-10 DIAGNOSIS — E119 Type 2 diabetes mellitus without complications: Secondary | ICD-10-CM | POA: Diagnosis not present

## 2018-11-10 DIAGNOSIS — I69351 Hemiplegia and hemiparesis following cerebral infarction affecting right dominant side: Secondary | ICD-10-CM | POA: Diagnosis not present

## 2018-11-10 DIAGNOSIS — Z7982 Long term (current) use of aspirin: Secondary | ICD-10-CM | POA: Diagnosis not present

## 2018-11-11 DIAGNOSIS — I493 Ventricular premature depolarization: Secondary | ICD-10-CM | POA: Diagnosis not present

## 2018-11-11 DIAGNOSIS — I472 Ventricular tachycardia: Secondary | ICD-10-CM | POA: Diagnosis not present

## 2018-11-14 DIAGNOSIS — I63312 Cerebral infarction due to thrombosis of left middle cerebral artery: Secondary | ICD-10-CM | POA: Diagnosis not present

## 2018-11-15 DIAGNOSIS — R69 Illness, unspecified: Secondary | ICD-10-CM | POA: Diagnosis not present

## 2018-11-16 DIAGNOSIS — I10 Essential (primary) hypertension: Secondary | ICD-10-CM | POA: Diagnosis not present

## 2018-11-16 DIAGNOSIS — E119 Type 2 diabetes mellitus without complications: Secondary | ICD-10-CM | POA: Diagnosis not present

## 2018-11-16 DIAGNOSIS — I69351 Hemiplegia and hemiparesis following cerebral infarction affecting right dominant side: Secondary | ICD-10-CM | POA: Diagnosis not present

## 2018-11-16 DIAGNOSIS — Z7984 Long term (current) use of oral hypoglycemic drugs: Secondary | ICD-10-CM | POA: Diagnosis not present

## 2018-11-16 DIAGNOSIS — Z7982 Long term (current) use of aspirin: Secondary | ICD-10-CM | POA: Diagnosis not present

## 2018-11-21 DIAGNOSIS — Z7982 Long term (current) use of aspirin: Secondary | ICD-10-CM | POA: Diagnosis not present

## 2018-11-21 DIAGNOSIS — I69351 Hemiplegia and hemiparesis following cerebral infarction affecting right dominant side: Secondary | ICD-10-CM | POA: Diagnosis not present

## 2018-11-21 DIAGNOSIS — Z7984 Long term (current) use of oral hypoglycemic drugs: Secondary | ICD-10-CM | POA: Diagnosis not present

## 2018-11-21 DIAGNOSIS — I10 Essential (primary) hypertension: Secondary | ICD-10-CM | POA: Diagnosis not present

## 2018-11-21 DIAGNOSIS — E119 Type 2 diabetes mellitus without complications: Secondary | ICD-10-CM | POA: Diagnosis not present

## 2018-11-23 DIAGNOSIS — Z299 Encounter for prophylactic measures, unspecified: Secondary | ICD-10-CM | POA: Diagnosis not present

## 2018-11-23 DIAGNOSIS — Z7982 Long term (current) use of aspirin: Secondary | ICD-10-CM | POA: Diagnosis not present

## 2018-11-23 DIAGNOSIS — I10 Essential (primary) hypertension: Secondary | ICD-10-CM | POA: Diagnosis not present

## 2018-11-23 DIAGNOSIS — E119 Type 2 diabetes mellitus without complications: Secondary | ICD-10-CM | POA: Diagnosis not present

## 2018-11-23 DIAGNOSIS — Z8673 Personal history of transient ischemic attack (TIA), and cerebral infarction without residual deficits: Secondary | ICD-10-CM | POA: Diagnosis not present

## 2018-11-23 DIAGNOSIS — E1165 Type 2 diabetes mellitus with hyperglycemia: Secondary | ICD-10-CM | POA: Diagnosis not present

## 2018-11-23 DIAGNOSIS — I69351 Hemiplegia and hemiparesis following cerebral infarction affecting right dominant side: Secondary | ICD-10-CM | POA: Diagnosis not present

## 2018-11-23 DIAGNOSIS — Z7984 Long term (current) use of oral hypoglycemic drugs: Secondary | ICD-10-CM | POA: Diagnosis not present

## 2018-11-23 DIAGNOSIS — Z6830 Body mass index (BMI) 30.0-30.9, adult: Secondary | ICD-10-CM | POA: Diagnosis not present

## 2018-11-28 DIAGNOSIS — I10 Essential (primary) hypertension: Secondary | ICD-10-CM | POA: Diagnosis not present

## 2018-11-28 DIAGNOSIS — E119 Type 2 diabetes mellitus without complications: Secondary | ICD-10-CM | POA: Diagnosis not present

## 2018-11-28 DIAGNOSIS — Z7982 Long term (current) use of aspirin: Secondary | ICD-10-CM | POA: Diagnosis not present

## 2018-11-28 DIAGNOSIS — I69351 Hemiplegia and hemiparesis following cerebral infarction affecting right dominant side: Secondary | ICD-10-CM | POA: Diagnosis not present

## 2018-11-28 DIAGNOSIS — Z7984 Long term (current) use of oral hypoglycemic drugs: Secondary | ICD-10-CM | POA: Diagnosis not present

## 2018-12-01 DIAGNOSIS — Z7984 Long term (current) use of oral hypoglycemic drugs: Secondary | ICD-10-CM | POA: Diagnosis not present

## 2018-12-01 DIAGNOSIS — I10 Essential (primary) hypertension: Secondary | ICD-10-CM | POA: Diagnosis not present

## 2018-12-01 DIAGNOSIS — I69351 Hemiplegia and hemiparesis following cerebral infarction affecting right dominant side: Secondary | ICD-10-CM | POA: Diagnosis not present

## 2018-12-01 DIAGNOSIS — E119 Type 2 diabetes mellitus without complications: Secondary | ICD-10-CM | POA: Diagnosis not present

## 2018-12-01 DIAGNOSIS — Z7982 Long term (current) use of aspirin: Secondary | ICD-10-CM | POA: Diagnosis not present

## 2018-12-07 DIAGNOSIS — I69351 Hemiplegia and hemiparesis following cerebral infarction affecting right dominant side: Secondary | ICD-10-CM | POA: Diagnosis not present

## 2018-12-07 DIAGNOSIS — E113413 Type 2 diabetes mellitus with severe nonproliferative diabetic retinopathy with macular edema, bilateral: Secondary | ICD-10-CM | POA: Diagnosis not present

## 2018-12-19 DIAGNOSIS — I10 Essential (primary) hypertension: Secondary | ICD-10-CM | POA: Diagnosis not present

## 2018-12-19 DIAGNOSIS — Z7722 Contact with and (suspected) exposure to environmental tobacco smoke (acute) (chronic): Secondary | ICD-10-CM | POA: Diagnosis not present

## 2018-12-19 DIAGNOSIS — Z7902 Long term (current) use of antithrombotics/antiplatelets: Secondary | ICD-10-CM | POA: Diagnosis not present

## 2018-12-19 DIAGNOSIS — Z801 Family history of malignant neoplasm of trachea, bronchus and lung: Secondary | ICD-10-CM | POA: Diagnosis not present

## 2018-12-19 DIAGNOSIS — Z7984 Long term (current) use of oral hypoglycemic drugs: Secondary | ICD-10-CM | POA: Diagnosis not present

## 2018-12-19 DIAGNOSIS — E119 Type 2 diabetes mellitus without complications: Secondary | ICD-10-CM | POA: Diagnosis not present

## 2018-12-19 DIAGNOSIS — E785 Hyperlipidemia, unspecified: Secondary | ICD-10-CM | POA: Diagnosis not present

## 2018-12-19 DIAGNOSIS — R32 Unspecified urinary incontinence: Secondary | ICD-10-CM | POA: Diagnosis not present

## 2018-12-19 DIAGNOSIS — N400 Enlarged prostate without lower urinary tract symptoms: Secondary | ICD-10-CM | POA: Diagnosis not present

## 2018-12-19 DIAGNOSIS — Z7982 Long term (current) use of aspirin: Secondary | ICD-10-CM | POA: Diagnosis not present

## 2019-01-04 DIAGNOSIS — Z6830 Body mass index (BMI) 30.0-30.9, adult: Secondary | ICD-10-CM | POA: Diagnosis not present

## 2019-01-04 DIAGNOSIS — I10 Essential (primary) hypertension: Secondary | ICD-10-CM | POA: Diagnosis not present

## 2019-01-04 DIAGNOSIS — Z713 Dietary counseling and surveillance: Secondary | ICD-10-CM | POA: Diagnosis not present

## 2019-01-04 DIAGNOSIS — Z299 Encounter for prophylactic measures, unspecified: Secondary | ICD-10-CM | POA: Diagnosis not present

## 2019-01-04 DIAGNOSIS — E1165 Type 2 diabetes mellitus with hyperglycemia: Secondary | ICD-10-CM | POA: Diagnosis not present

## 2019-01-19 DIAGNOSIS — R69 Illness, unspecified: Secondary | ICD-10-CM | POA: Diagnosis not present

## 2019-02-01 DIAGNOSIS — E113413 Type 2 diabetes mellitus with severe nonproliferative diabetic retinopathy with macular edema, bilateral: Secondary | ICD-10-CM | POA: Diagnosis not present

## 2019-02-04 DIAGNOSIS — R69 Illness, unspecified: Secondary | ICD-10-CM | POA: Diagnosis not present

## 2019-02-13 DIAGNOSIS — I519 Heart disease, unspecified: Secondary | ICD-10-CM | POA: Diagnosis not present

## 2019-02-13 DIAGNOSIS — I517 Cardiomegaly: Secondary | ICD-10-CM | POA: Diagnosis not present

## 2019-02-13 DIAGNOSIS — I5189 Other ill-defined heart diseases: Secondary | ICD-10-CM | POA: Diagnosis not present

## 2019-02-13 DIAGNOSIS — I340 Nonrheumatic mitral (valve) insufficiency: Secondary | ICD-10-CM | POA: Diagnosis not present

## 2019-03-06 DIAGNOSIS — R69 Illness, unspecified: Secondary | ICD-10-CM | POA: Diagnosis not present

## 2019-03-29 DIAGNOSIS — E113413 Type 2 diabetes mellitus with severe nonproliferative diabetic retinopathy with macular edema, bilateral: Secondary | ICD-10-CM | POA: Diagnosis not present

## 2019-03-29 DIAGNOSIS — H35372 Puckering of macula, left eye: Secondary | ICD-10-CM | POA: Diagnosis not present

## 2019-03-29 DIAGNOSIS — H43813 Vitreous degeneration, bilateral: Secondary | ICD-10-CM | POA: Diagnosis not present

## 2019-04-04 DIAGNOSIS — E113313 Type 2 diabetes mellitus with moderate nonproliferative diabetic retinopathy with macular edema, bilateral: Secondary | ICD-10-CM | POA: Diagnosis not present

## 2019-04-04 DIAGNOSIS — H524 Presbyopia: Secondary | ICD-10-CM | POA: Diagnosis not present

## 2019-04-12 DIAGNOSIS — E1165 Type 2 diabetes mellitus with hyperglycemia: Secondary | ICD-10-CM | POA: Diagnosis not present

## 2019-04-12 DIAGNOSIS — Z299 Encounter for prophylactic measures, unspecified: Secondary | ICD-10-CM | POA: Diagnosis not present

## 2019-04-12 DIAGNOSIS — Z6828 Body mass index (BMI) 28.0-28.9, adult: Secondary | ICD-10-CM | POA: Diagnosis not present

## 2019-04-12 DIAGNOSIS — Z713 Dietary counseling and surveillance: Secondary | ICD-10-CM | POA: Diagnosis not present

## 2019-04-12 DIAGNOSIS — E119 Type 2 diabetes mellitus without complications: Secondary | ICD-10-CM | POA: Diagnosis not present

## 2019-04-12 DIAGNOSIS — I10 Essential (primary) hypertension: Secondary | ICD-10-CM | POA: Diagnosis not present

## 2019-05-01 DIAGNOSIS — E7800 Pure hypercholesterolemia, unspecified: Secondary | ICD-10-CM | POA: Diagnosis not present

## 2019-05-01 DIAGNOSIS — Z7189 Other specified counseling: Secondary | ICD-10-CM | POA: Diagnosis not present

## 2019-05-01 DIAGNOSIS — Z0000 Encounter for general adult medical examination without abnormal findings: Secondary | ICD-10-CM | POA: Diagnosis not present

## 2019-05-01 DIAGNOSIS — Z1211 Encounter for screening for malignant neoplasm of colon: Secondary | ICD-10-CM | POA: Diagnosis not present

## 2019-05-01 DIAGNOSIS — Z6828 Body mass index (BMI) 28.0-28.9, adult: Secondary | ICD-10-CM | POA: Diagnosis not present

## 2019-05-01 DIAGNOSIS — Z1331 Encounter for screening for depression: Secondary | ICD-10-CM | POA: Diagnosis not present

## 2019-05-01 DIAGNOSIS — R69 Illness, unspecified: Secondary | ICD-10-CM | POA: Diagnosis not present

## 2019-05-01 DIAGNOSIS — R972 Elevated prostate specific antigen [PSA]: Secondary | ICD-10-CM | POA: Diagnosis not present

## 2019-05-01 DIAGNOSIS — Z79899 Other long term (current) drug therapy: Secondary | ICD-10-CM | POA: Diagnosis not present

## 2019-05-01 DIAGNOSIS — N400 Enlarged prostate without lower urinary tract symptoms: Secondary | ICD-10-CM | POA: Diagnosis not present

## 2019-05-01 DIAGNOSIS — Z1339 Encounter for screening examination for other mental health and behavioral disorders: Secondary | ICD-10-CM | POA: Diagnosis not present

## 2019-05-01 DIAGNOSIS — R5383 Other fatigue: Secondary | ICD-10-CM | POA: Diagnosis not present

## 2019-06-07 DIAGNOSIS — E113413 Type 2 diabetes mellitus with severe nonproliferative diabetic retinopathy with macular edema, bilateral: Secondary | ICD-10-CM | POA: Diagnosis not present

## 2019-06-07 DIAGNOSIS — H35372 Puckering of macula, left eye: Secondary | ICD-10-CM | POA: Diagnosis not present

## 2019-06-07 DIAGNOSIS — H35033 Hypertensive retinopathy, bilateral: Secondary | ICD-10-CM | POA: Diagnosis not present

## 2019-06-07 DIAGNOSIS — H43813 Vitreous degeneration, bilateral: Secondary | ICD-10-CM | POA: Diagnosis not present

## 2019-06-10 DIAGNOSIS — R69 Illness, unspecified: Secondary | ICD-10-CM | POA: Diagnosis not present

## 2019-06-13 NOTE — H&P (Signed)
Surgical History & Physical  Patient Name: Thomas Duarte DOB: Apr 09, 1941  Surgery: Cataract extraction with intraocular lens implant phacoemulsification; Right Eye  Surgeon: Baruch Goldmann MD Surgery Date:  06/23/2019 Pre-Op Date:  06/05/2019  HPI: A 79 Yr. old male patient 1. PO OS- PreOP OD. The patient is returning after cataract surgery. The left eye is affected. Since the last visit, the affected area is doing great ; he says VA was better right away. The condition's severity is constant; no new flashes or floaters OU. Patient is following medication instructions BID OS. Pt is having irritation and redness in OU at this moment due to Retina shots that she had yesterday. Pt c/o OD VA is very blurry where letters run together. There is a significant imbalance between the two eyes. This is negatively affecting the patient's quality of life.  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 Diabetes Enlarged prostate High Blood Pressure LDL Stroke  Review of Systems Negative Allergic/Immunologic Negative Cardiovascular Negative Constitutional Negative Gastrointestinal Negative Genitourinary Negative Ear, Nose, Mouth & Throat Negative Neurological Negative Respiratory  Social   Never smoked    Medication Tobrex ophth solution, Prednisolon-gatiflox-bromfenac,  Tamsulosin, Metformin, Lisinopril, Clopidogrel, Atorvastatin, Aspirin, Amlodipine Besylate,   Sx/Procedures Eyelea Injections, Phaco c IOL,   Drug Allergies   NKDA  History & Physical: Heent:  Cataract, Right eye NECK: supple without bruits LUNGS: lungs clear to auscultation CV: regular rate and rhythm Abdomen: soft and non-tender  Impression & Plan: Assessment: 1.  COMBINED FORMS AGE RELATED CATARACT; Right Eye (H25.811) 2.  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  CATARACT EXTRACTION STATUS; Left Eye (Z98.42) 4.   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  Cataract accounts for the patient's decreased vision. This visual impairment is not correctable with a tolerable change in glasses or contact lenses. Cataract surgery with an implantation of a new lens should significantly improve the visual and functional status of the patient. Discussed all risks, benefits, alternatives, and potential complications. Discussed the procedures and recovery. Patient desires to have surgery. A-scan ordered and performed today for intra-ocular lens calculations. The surgery will be performed in order to improve vision for driving, reading, and for eye examinations. Recommend phacoemulsification with intra-ocular lens. Right Eye. Surgery required to correct imbalance of vision. Dilates poorly - shugacaine by protocol. Malyugin Ring. Flomax. 2.   3.  1 week after cataract surgery. Doing well with improved vision and normal eye pressure. Call with any problems or concerns. Continue Gati-Brom-Pred 2x/day for 3 more weeks. 4.  New. Findings, prognosis and treatment options reviewed.

## 2019-06-19 DIAGNOSIS — H25811 Combined forms of age-related cataract, right eye: Secondary | ICD-10-CM | POA: Diagnosis not present

## 2019-06-20 NOTE — Patient Instructions (Signed)
Thomas Duarte  06/20/2019     @PREFPERIOPPHARMACY @   Your procedure is scheduled on  06/23/2019  Report to [facility] at  [place]  A.M.  Call this number if you have problems the morning of surgery:  [phone]   Remember:  Do not eat or drink after midnight.                       Take these medicines the morning of surgery with A SIP OF WATER  Amlodipine, flomax.    Do not wear jewelry, make-up or nail polish.  Do not wear lotions, powders, or perfumes. Please wear deodorant and brush your teeth.  Do not shave 48 hours prior to surgery.  Men may shave face and neck.  Do not bring valuables to the hospital.  [facility] is not responsible for any belongings or valuables.  Contacts, dentures or bridgework may not be worn into surgery.  Leave your suitcase in the car.  After surgery it may be brought to your room.  For patients admitted to the hospital, discharge time will be determined by your treatment team.  Patients discharged the day of surgery will not be allowed to drive home.   Name and phone number of your driver:   family Special instructions:  None  Please read over the following fact sheets that you were given. Anesthesia Post-op Instructions and Care and Recovery After Surgery       Cataract Surgery, Care After This sheet gives you information about how to care for yourself after your procedure. Your health care provider may also give you more specific instructions. If you have problems or questions, contact your health care provider. What can I expect after the procedure? After the procedure, it is common to have:  Itching.  Discomfort.  Fluid discharge.  Sensitivity to light and to touch.  Bruising in or around the eye.  Mild blurred vision. Follow these instructions at home: Eye care   Do not touch or rub your eyes.  Protect your eyes as told by your health care provider. You may be told to wear a protective eye shield or  sunglasses.  Do not put a contact lens into the affected eye or eyes until your health care provider approves.  Keep the area around your eye clean and dry: ? Avoid swimming. ? Do not allow water to hit you directly in the face while showering. ? Keep soap and shampoo out of your eyes.  Check your eye every day for signs of infection. Watch for: ? Redness, swelling, or pain. ? Fluid, blood, or pus. ? Warmth. ? A bad smell. ? Vision that is getting worse. ? Sensitivity that is getting worse. Activity  Do not drive for 24 hours if you were given a sedative during your procedure.  Avoid strenuous activities, such as playing contact sports, for as long as told by your health care provider.  Do not drive or use heavy machinery until your health care provider approves.  Do not bend or lift heavy objects. Bending increases pressure in the eye. You can walk, climb stairs, and do light household chores.  Ask your health care provider when you can return to work. If you work in a dusty environment, you may be advised to wear protective eyewear for a period of time. General instructions  Take or apply over-the-counter and prescription medicines only as told by your health care provider. This includes  eye drops.  Keep all follow-up visits as told by your health care provider. This is important. Contact a health care provider if:  You have increased bruising around your eye.  You have pain that is not helped with medicine.  You have a fever.  You have redness, swelling, or pain in your eye.  You have fluid, blood, or pus coming from your incision.  Your vision gets worse.  Your sensitivity to light gets worse. Get help right away if:  You have sudden loss of vision.  You see flashes of light or spots (floaters).  You have severe eye pain.  You develop nausea or vomiting. Summary  After your procedure, it is common to have itching, discomfort, bruising, fluid discharge, or  sensitivity to light.  Follow instructions from your health care provider about caring for your eye after the procedure.  Do not rub your eye after the procedure. You may need to wear eye protection or sunglasses. Do not wear contact lenses. Keep the area around your eye clean and dry.  Avoid activities that require a lot of effort. These include playing sports and lifting heavy objects.  Contact a health care provider if you have increased bruising, pain that does not go away, or a fever. Get help right away if you suddenly lose your vision, see flashes of light or spots, or have severe pain in the eye. This information is not intended to replace advice given to you by your health care provider. Make sure you discuss any questions you have with your health care provider. Document Released: 02/20/2005 Document Revised: 01/31/2018 Document Reviewed: 01/31/2018 Elsevier Patient Education  2020 [place] After These instructions provide you with information about caring for yourself after your procedure. Your health care provider may also give you more specific instructions. Your treatment has been planned according to current medical practices, but problems sometimes occur. Call your health care provider if you have any problems or questions after your procedure. What can I expect after the procedure? After your procedure, you may:  Feel sleepy for several hours.  Feel clumsy and have poor balance for several hours.  Feel forgetful about what happened after the procedure.  Have poor judgment for several hours.  Feel nauseous or vomit.  Have a sore throat if you had a breathing tube during the procedure. Follow these instructions at home: For at least 24 hours after the procedure:      Have a responsible adult stay with you. It is important to have someone help care for you until you are awake and alert.  Rest as needed.  Do not: ? Participate  in activities in which you could fall or become injured. ? Drive. ? Use heavy machinery. ? Drink alcohol. ? Take sleeping pills or medicines that cause drowsiness. ? Make important decisions or sign legal documents. ? Take care of children on your own. Eating and drinking  Follow the diet that is recommended by your health care provider.  If you vomit, drink water, juice, or soup when you can drink without vomiting.  Make sure you have little or no nausea before eating solid foods. General instructions  Take over-the-counter and prescription medicines only as told by your health care provider.  If you have sleep apnea, surgery and certain medicines can increase your risk for breathing problems. Follow instructions from your health care provider about wearing your sleep device: ? Anytime you are sleeping, including during daytime naps. ? While  taking prescription pain medicines, sleeping medicines, or medicines that make you drowsy.  If you smoke, do not smoke without supervision.  Keep all follow-up visits as told by your health care provider. This is important. Contact a health care provider if:  You keep feeling nauseous or you keep vomiting.  You feel light-headed.  You develop a rash.  You have a fever. Get help right away if:  You have trouble breathing. Summary  For several hours after your procedure, you may feel sleepy and have poor judgment.  Have a responsible adult stay with you for at least 24 hours or until you are awake and alert. This information is not intended to replace advice given to you by your health care provider. Make sure you discuss any questions you have with your health care provider. Document Released: 11/24/2015 Document Revised: 11/01/2017 Document Reviewed: 11/24/2015 Elsevier Patient Education  2020 [employer].

## 2019-06-21 ENCOUNTER — Encounter (HOSPITAL_COMMUNITY)
Admission: RE | Admit: 2019-06-21 | Discharge: 2019-06-21 | Disposition: A | Discharge status: Home / Self Care | Payer: Medicare HMO | Source: Ambulatory Visit | Attending: Ophthalmology | Admitting: Ophthalmology

## 2019-06-21 ENCOUNTER — Other Ambulatory Visit (HOSPITAL_COMMUNITY)
Admission: RE | Admit: 2019-06-21 | Discharge: 2019-06-21 | Disposition: A | Discharge status: Home / Self Care | Payer: Medicare HMO | Source: Ambulatory Visit | Attending: Ophthalmology | Admitting: Ophthalmology

## 2019-06-21 ENCOUNTER — Other Ambulatory Visit: Payer: Self-pay

## 2019-06-21 ENCOUNTER — Encounter (HOSPITAL_COMMUNITY): Payer: Self-pay

## 2019-06-21 DIAGNOSIS — Z20828 Contact with and (suspected) exposure to other viral communicable diseases: Secondary | ICD-10-CM | POA: Insufficient documentation

## 2019-06-21 DIAGNOSIS — Z01812 Encounter for preprocedural laboratory examination: Secondary | ICD-10-CM | POA: Diagnosis present

## 2019-06-21 HISTORY — DX: Cerebral infarction, unspecified: I63.9

## 2019-06-21 LAB — BASIC METABOLIC PANEL
Anion gap: 10 (ref 5–15)
BUN: 25 mg/dL — ABNORMAL HIGH (ref 8–23)
CO2: 20 mmol/L — ABNORMAL LOW (ref 22–32)
Calcium: 8.9 mg/dL (ref 8.9–10.3)
Chloride: 108 mmol/L (ref 98–111)
Creatinine, Ser: 1.3 mg/dL — ABNORMAL HIGH (ref 0.61–1.24)
GFR calc Af Amer: >60 mL/min (ref >60)
GFR calc non Af Amer: 52 mL/min — ABNORMAL LOW (ref >60)
Glucose, Bld: 230 mg/dL — ABNORMAL HIGH (ref 70–99)
Potassium: 3.9 mmol/L (ref 3.5–5.1)
Sodium: 138 mmol/L (ref 135–145)

## 2019-06-21 LAB — GLUCOSE, CAPILLARY: Glucose-Capillary: 236 mg/dL — ABNORMAL HIGH (ref 70–99)

## 2019-06-21 LAB — SARS CORONAVIRUS 2 (TAT 6-24 HRS): SARS Coronavirus 2: NEGATIVE

## 2019-06-21 LAB — HEMOGLOBIN A1C
Hgb A1c MFr Bld: 6.8 % — ABNORMAL HIGH (ref 4.8–5.6)
Mean Plasma Glucose: 148.46 mg/dL

## 2019-06-22 MED ORDER — NEOMYCIN-POLYMYXIN-DEXAMETH 3.5-10000-0.1 OP SUSP
OPHTHALMIC | Status: AC
  Filled 2019-06-22: qty 5

## 2019-06-22 MED ORDER — LIDOCAINE HCL 3.5 % OP GEL
OPHTHALMIC | Status: AC
  Filled 2019-06-22: qty 1

## 2019-06-22 MED ORDER — PHENYLEPHRINE HCL 2.5 % OP SOLN
OPHTHALMIC | Status: AC
  Filled 2019-06-22: qty 15

## 2019-06-22 MED ORDER — LIDOCAINE HCL (PF) 1 % IJ SOLN
INTRAMUSCULAR | Status: AC
  Filled 2019-06-22: qty 2

## 2019-06-22 MED ORDER — TETRACAINE HCL 0.5 % OP SOLN
OPHTHALMIC | Status: AC
  Filled 2019-06-22: qty 4

## 2019-06-22 MED ORDER — CYCLOPENTOLATE-PHENYLEPHRINE 0.2-1 % OP SOLN
OPHTHALMIC | Status: AC
  Filled 2019-06-22: qty 2

## 2019-06-22 NOTE — Pre-Procedure Instructions (Signed)
HgbA1C routed to PCP. 

## 2019-06-23 ENCOUNTER — Encounter (HOSPITAL_COMMUNITY)
Admission: RE | Disposition: A | Discharge status: Home / Self Care | Payer: Self-pay | Source: Home / Self Care | Attending: Ophthalmology

## 2019-06-23 ENCOUNTER — Ambulatory Visit (HOSPITAL_COMMUNITY): Payer: Medicare HMO | Admitting: Anesthesiology

## 2019-06-23 ENCOUNTER — Ambulatory Visit (HOSPITAL_COMMUNITY)
Admission: RE | Admit: 2019-06-23 | Discharge: 2019-06-23 | Disposition: A | Discharge status: Home / Self Care | Payer: Medicare HMO | Attending: Ophthalmology | Admitting: Ophthalmology

## 2019-06-23 ENCOUNTER — Encounter (HOSPITAL_COMMUNITY): Payer: Self-pay | Admitting: Anesthesiology

## 2019-06-23 DIAGNOSIS — Z8673 Personal history of transient ischemic attack (TIA), and cerebral infarction without residual deficits: Secondary | ICD-10-CM | POA: Diagnosis not present

## 2019-06-23 DIAGNOSIS — Z9842 Cataract extraction status, left eye: Secondary | ICD-10-CM | POA: Insufficient documentation

## 2019-06-23 DIAGNOSIS — R69 Illness, unspecified: Secondary | ICD-10-CM | POA: Diagnosis not present

## 2019-06-23 DIAGNOSIS — Z7984 Long term (current) use of oral hypoglycemic drugs: Secondary | ICD-10-CM | POA: Insufficient documentation

## 2019-06-23 DIAGNOSIS — N400 Enlarged prostate without lower urinary tract symptoms: Secondary | ICD-10-CM | POA: Insufficient documentation

## 2019-06-23 DIAGNOSIS — Z79899 Other long term (current) drug therapy: Secondary | ICD-10-CM | POA: Diagnosis not present

## 2019-06-23 DIAGNOSIS — Z7982 Long term (current) use of aspirin: Secondary | ICD-10-CM | POA: Insufficient documentation

## 2019-06-23 DIAGNOSIS — I10 Essential (primary) hypertension: Secondary | ICD-10-CM | POA: Insufficient documentation

## 2019-06-23 DIAGNOSIS — H25811 Combined forms of age-related cataract, right eye: Secondary | ICD-10-CM | POA: Diagnosis not present

## 2019-06-23 DIAGNOSIS — E1136 Type 2 diabetes mellitus with diabetic cataract: Secondary | ICD-10-CM | POA: Diagnosis not present

## 2019-06-23 DIAGNOSIS — E119 Type 2 diabetes mellitus without complications: Secondary | ICD-10-CM | POA: Diagnosis not present

## 2019-06-23 HISTORY — PX: CATARACT EXTRACTION W/PHACO: SHX586

## 2019-06-23 LAB — GLUCOSE, CAPILLARY: Glucose-Capillary: 148 mg/dL — ABNORMAL HIGH (ref 70–99)

## 2019-06-23 SURGERY — PHACOEMULSIFICATION, CATARACT, WITH IOL INSERTION
Anesthesia: Monitor Anesthesia Care | Site: Eye | Laterality: Right

## 2019-06-23 MED ORDER — PROVISC 10 MG/ML IO SOLN
INTRAOCULAR | Status: DC | PRN
  Administered 2019-06-23: 0.85 mL via INTRAOCULAR

## 2019-06-23 MED ORDER — TETRACAINE HCL 0.5 % OP SOLN
1.0000 [drp] | OPHTHALMIC | Status: AC | PRN
  Administered 2019-06-23 (×3): 1 [drp] via OPHTHALMIC

## 2019-06-23 MED ORDER — SODIUM HYALURONATE 23 MG/ML IO SOLN
INTRAOCULAR | Status: DC | PRN
  Administered 2019-06-23: 0.6 mL via INTRAOCULAR

## 2019-06-23 MED ORDER — LIDOCAINE HCL (PF) 1 % IJ SOLN
INTRAOCULAR | Status: DC | PRN
  Administered 2019-06-23: 1 mL via OPHTHALMIC

## 2019-06-23 MED ORDER — NEOMYCIN-POLYMYXIN-DEXAMETH 3.5-10000-0.1 OP SUSP
OPHTHALMIC | Status: DC | PRN
  Administered 2019-06-23: 2 [drp] via OPHTHALMIC

## 2019-06-23 MED ORDER — POVIDONE-IODINE 5 % OP SOLN
OPHTHALMIC | Status: DC | PRN
  Administered 2019-06-23: 1 via OPHTHALMIC

## 2019-06-23 MED ORDER — PHENYLEPHRINE HCL 2.5 % OP SOLN
1.0000 [drp] | OPHTHALMIC | Status: AC | PRN
  Administered 2019-06-23 (×3): 1 [drp] via OPHTHALMIC

## 2019-06-23 MED ORDER — EPINEPHRINE PF 1 MG/ML IJ SOLN
INTRAOCULAR | Status: DC | PRN
  Administered 2019-06-23: 500 mL

## 2019-06-23 MED ORDER — LIDOCAINE HCL 3.5 % OP GEL
1.0000 "application " | Freq: Once | OPHTHALMIC | Status: AC
  Administered 2019-06-23: 1 via OPHTHALMIC

## 2019-06-23 MED ORDER — EPINEPHRINE PF 1 MG/ML IJ SOLN
INTRAMUSCULAR | Status: AC
  Filled 2019-06-23: qty 2

## 2019-06-23 MED ORDER — BSS IO SOLN
INTRAOCULAR | Status: DC | PRN
  Administered 2019-06-23: 15 mL via INTRAOCULAR

## 2019-06-23 MED ORDER — CYCLOPENTOLATE-PHENYLEPHRINE 0.2-1 % OP SOLN
1.0000 [drp] | OPHTHALMIC | Status: AC | PRN
  Administered 2019-06-23 (×3): 1 [drp] via OPHTHALMIC

## 2019-06-23 SURGICAL SUPPLY — 13 items

## 2019-06-23 NOTE — Interval H&P Note (Signed)
History and Physical Interval Note: The H and P was reviewed and updated. The patient was examined.  No changes were found after exam.  The surgical eye was marked.  06/23/2019 7:52 AM  Thomas Duarte  has presented today for surgery, with the diagnosis of Nuclear sclerotic cataract - Right eye.  The various methods of treatment have been discussed with the patient and family. After consideration of risks, benefits and other options for treatment, the patient has consented to  Procedure(s) with comments: CATARACT EXTRACTION PHACO AND INTRAOCULAR LENS PLACEMENT (IOC) (Right) - right as a surgical intervention.  The patient's history has been reviewed, patient examined, no change in status, stable for surgery.  I have reviewed the patient's chart and labs.  Questions were answered to the patient's satisfaction.     Baruch Goldmann

## 2019-06-23 NOTE — Op Note (Signed)
Date of procedure: 06/23/19  Pre-operative diagnosis:  Visually significant combined form age-related cataract, Right Eye (H25.811)  Post-operative diagnosis:  Visually significant combined form age-related cataract, Right Eye (H25.811)  Procedure: Removal of cataract via phacoemulsification and insertion of intra-ocular lens [employer] PCB00  +18.0D into the capsular bag of the Right Eye  Attending surgeon: Gerda Diss. Jaspreet Hollings, MD, MA  Anesthesia: MAC, Topical [place]  Complications: None  Estimated Blood Loss: <23m (minimal)  Specimens: None  Implants: As above  Indications:  Visually significant age-related cataract, Right Eye  Procedure:  The patient was seen and identified in the pre-operative area. The operative eye was identified and dilated.  The operative eye was marked.  Topical anesthesia was administered to the operative eye.     The patient was then to the operative suite and placed in the supine position.  A timeout was performed confirming the patient, procedure to be performed, and all other relevant information.   The patient's face was prepped and draped in the usual fashion for intra-ocular surgery.  A lid speculum was placed into the operative eye and the surgical microscope moved into place and focused.  A superotemporal paracentesis was created using a 20 gauge paracentesis blade.  Shugarcaine was injected into the anterior chamber.  Viscoelastic was injected into the anterior chamber.  A temporal clear-corneal main wound incision was created using a 2.426mmicrokeratome.  A continuous curvilinear capsulorrhexis was initiated using an irrigating cystitome and completed using capsulorrhexis forceps.  Hydrodissection and hydrodeliniation were performed.  Viscoelastic was injected into the anterior chamber.  A phacoemulsification handpiece and a chopper as a second instrument were used to remove the nucleus and epinucleus. The irrigation/aspiration handpiece was  used to remove any remaining cortical material.   The capsular bag was reinflated with viscoelastic, checked, and found to be intact.  The intraocular lens was inserted into the capsular bag.  The irrigation/aspiration handpiece was used to remove any remaining viscoelastic.  The clear corneal wound and paracentesis wounds were then hydrated and checked with Weck-Cels to be watertight.  The lid-speculum and drape was removed, and the patient's face was cleaned with a wet and dry 4x4.  Maxitrol was instilled in the eye before a clear shield was taped over the eye. The patient was taken to the post-operative care unit in good condition, having tolerated the procedure well.  Post-Op Instructions: The patient will follow up at [facility] a same day post-operative evaluation and will receive all other orders and instructions.

## 2019-06-23 NOTE — Discharge Instructions (Signed)
Please discharge patient when stable, will follow up today with Dr. Latravious Levitt at the [facility] office immediately following discharge.  Leave shield in place until visit.  All paperwork with discharge instructions will be given at the office. ° °

## 2019-06-23 NOTE — Anesthesia Preprocedure Evaluation (Signed)
Anesthesia Evaluation  Patient identified by MRN, date of birth, ID band Patient awake    Reviewed: Allergy & Precautions, NPO status , Patient's Chart, lab work & pertinent test results  Airway Mallampati: II  TM Distance: >3 FB Neck ROM: Full    Dental no notable dental hx. (+) Teeth Intact   Pulmonary neg pulmonary ROS,    Pulmonary exam normal breath sounds clear to auscultation       Cardiovascular Exercise Tolerance: Good hypertension, Pt. on medications negative cardio ROS Normal cardiovascular examI Rhythm:Regular Rate:Normal     Neuro/Psych CVA, Residual Symptoms negative psych ROS   GI/Hepatic negative GI ROS, Neg liver ROS,   Endo/Other  negative endocrine ROSdiabetes, Well Controlled, Type 2, Oral Hypoglycemic Agents  Renal/GU negative Renal ROS  negative genitourinary   Musculoskeletal negative musculoskeletal ROS (+)   Abdominal   Peds negative pediatric ROS (+)  Hematology negative hematology ROS (+)   Anesthesia Other Findings BPH  Reproductive/Obstetrics negative OB ROS                             Anesthesia Physical Anesthesia Plan  ASA: III  Anesthesia Plan: MAC   Post-op Pain Management:    Induction: Intravenous  PONV Risk Score and Plan: TIVA, Ondansetron and Treatment may vary due to age or medical condition  Airway Management Planned: Simple Face Mask and Nasal Cannula  Additional Equipment:   Intra-op Plan:   Post-operative Plan: Extubation in OR  Informed Consent: I have reviewed the patients History and Physical, chart, labs and discussed the procedure including the risks, benefits and alternatives for the proposed anesthesia with the patient or authorized representative who has indicated his/her understanding and acceptance.     Dental advisory given  Plan Discussed with: CRNA  Anesthesia Plan Comments: (Plan Full PPE use  Plan MAC  Pt  reports intermittent sneezing )        Anesthesia Quick Evaluation

## 2019-06-23 NOTE — Transfer of Care (Signed)
Immediate Anesthesia Transfer of Care Note  Patient: Thomas Duarte  Procedure(s) Performed: CATARACT EXTRACTION PHACO AND INTRAOCULAR LENS PLACEMENT (IOC) (Right Eye)  Patient Location: Short Stay  Anesthesia Type:MAC  Level of Consciousness: awake, alert  and patient cooperative  Airway & Oxygen Therapy: Patient Spontanous Breathing  Post-op Assessment: Report given to RN and Post -op Vital signs reviewed and stable  Post vital signs: Reviewed and stable  Last Vitals:  Vitals Value Taken Time  BP    Temp    Pulse    Resp    SpO2      Last Pain:  Vitals:   06/23/19 0730  TempSrc: Oral  PainSc: 0-No pain      Patients Stated Pain Goal: 6 (80/88/11 0315)  Complications: No apparent anesthesia complications

## 2019-06-23 NOTE — Anesthesia Postprocedure Evaluation (Signed)
Anesthesia Post Note  Patient: Thomas Duarte  Procedure(s) Performed: CATARACT EXTRACTION PHACO AND INTRAOCULAR LENS PLACEMENT ([place]) (Right Eye)  Patient location during evaluation: Short Stay Anesthesia Type: MAC Level of consciousness: awake and alert Pain management: pain level controlled Vital Signs Assessment: post-procedure vital signs reviewed and stable Respiratory status: spontaneous breathing Cardiovascular status: stable Anesthetic complications: no     Last Vitals:  Vitals:   06/23/19 0730  BP: (!) 149/73  Pulse: 67  Resp: 18  Temp: 36.7 C  SpO2: 100%    Last Pain:  Vitals:   06/23/19 0730  TempSrc: Oral  PainSc: 0-No pain                 Thomas Duarte

## 2019-06-26 ENCOUNTER — Encounter (HOSPITAL_COMMUNITY): Payer: Self-pay | Admitting: Ophthalmology

## 2019-06-29 DIAGNOSIS — H2511 Age-related nuclear cataract, right eye: Secondary | ICD-10-CM | POA: Diagnosis not present

## 2019-07-05 DIAGNOSIS — E113413 Type 2 diabetes mellitus with severe nonproliferative diabetic retinopathy with macular edema, bilateral: Secondary | ICD-10-CM | POA: Diagnosis not present

## 2019-07-05 NOTE — Addendum Note (Signed)
Addendum  created 07/05/19 1123 by Ollen Bowl, CRNA   Charge Capture section accepted

## 2019-07-17 DIAGNOSIS — I10 Essential (primary) hypertension: Secondary | ICD-10-CM | POA: Diagnosis not present

## 2019-07-17 DIAGNOSIS — I739 Peripheral vascular disease, unspecified: Secondary | ICD-10-CM | POA: Diagnosis not present

## 2019-07-17 DIAGNOSIS — N182 Chronic kidney disease, stage 2 (mild): Secondary | ICD-10-CM | POA: Diagnosis not present

## 2019-07-17 DIAGNOSIS — Z6829 Body mass index (BMI) 29.0-29.9, adult: Secondary | ICD-10-CM | POA: Diagnosis not present

## 2019-07-17 DIAGNOSIS — E1122 Type 2 diabetes mellitus with diabetic chronic kidney disease: Secondary | ICD-10-CM | POA: Diagnosis not present

## 2019-07-17 DIAGNOSIS — Z8679 Personal history of other diseases of the circulatory system: Secondary | ICD-10-CM | POA: Diagnosis not present

## 2019-07-17 DIAGNOSIS — Z299 Encounter for prophylactic measures, unspecified: Secondary | ICD-10-CM | POA: Diagnosis not present

## 2019-07-17 DIAGNOSIS — E1165 Type 2 diabetes mellitus with hyperglycemia: Secondary | ICD-10-CM | POA: Diagnosis not present

## 2019-07-31 DIAGNOSIS — R69 Illness, unspecified: Secondary | ICD-10-CM | POA: Diagnosis not present

## 2019-08-08 DIAGNOSIS — Z961 Presence of intraocular lens: Secondary | ICD-10-CM | POA: Diagnosis not present

## 2019-08-15 DIAGNOSIS — H35372 Puckering of macula, left eye: Secondary | ICD-10-CM | POA: Diagnosis not present

## 2019-08-15 DIAGNOSIS — E113311 Type 2 diabetes mellitus with moderate nonproliferative diabetic retinopathy with macular edema, right eye: Secondary | ICD-10-CM | POA: Diagnosis not present

## 2019-08-15 DIAGNOSIS — H35033 Hypertensive retinopathy, bilateral: Secondary | ICD-10-CM | POA: Diagnosis not present

## 2019-08-15 DIAGNOSIS — E113412 Type 2 diabetes mellitus with severe nonproliferative diabetic retinopathy with macular edema, left eye: Secondary | ICD-10-CM | POA: Diagnosis not present

## 2019-09-11 DIAGNOSIS — R69 Illness, unspecified: Secondary | ICD-10-CM | POA: Diagnosis not present

## 2019-09-21 DIAGNOSIS — R972 Elevated prostate specific antigen [PSA]: Secondary | ICD-10-CM | POA: Diagnosis not present

## 2019-09-21 DIAGNOSIS — E1165 Type 2 diabetes mellitus with hyperglycemia: Secondary | ICD-10-CM | POA: Diagnosis not present

## 2019-09-21 DIAGNOSIS — Z299 Encounter for prophylactic measures, unspecified: Secondary | ICD-10-CM | POA: Diagnosis not present

## 2019-09-21 DIAGNOSIS — R0602 Shortness of breath: Secondary | ICD-10-CM | POA: Diagnosis not present

## 2019-09-21 DIAGNOSIS — Z6829 Body mass index (BMI) 29.0-29.9, adult: Secondary | ICD-10-CM | POA: Diagnosis not present

## 2019-09-21 DIAGNOSIS — N183 Chronic kidney disease, stage 3 unspecified: Secondary | ICD-10-CM | POA: Diagnosis not present

## 2019-09-21 DIAGNOSIS — Z789 Other specified health status: Secondary | ICD-10-CM | POA: Diagnosis not present

## 2019-09-21 DIAGNOSIS — R600 Localized edema: Secondary | ICD-10-CM | POA: Diagnosis not present

## 2019-09-21 DIAGNOSIS — I10 Essential (primary) hypertension: Secondary | ICD-10-CM | POA: Diagnosis not present

## 2019-09-25 DIAGNOSIS — R0602 Shortness of breath: Secondary | ICD-10-CM | POA: Diagnosis not present

## 2019-10-11 DIAGNOSIS — E113412 Type 2 diabetes mellitus with severe nonproliferative diabetic retinopathy with macular edema, left eye: Secondary | ICD-10-CM | POA: Diagnosis not present

## 2019-10-11 DIAGNOSIS — E113311 Type 2 diabetes mellitus with moderate nonproliferative diabetic retinopathy with macular edema, right eye: Secondary | ICD-10-CM | POA: Diagnosis not present

## 2019-10-11 DIAGNOSIS — H35033 Hypertensive retinopathy, bilateral: Secondary | ICD-10-CM | POA: Diagnosis not present

## 2019-10-11 DIAGNOSIS — H35372 Puckering of macula, left eye: Secondary | ICD-10-CM | POA: Diagnosis not present

## 2019-10-23 DIAGNOSIS — R69 Illness, unspecified: Secondary | ICD-10-CM | POA: Diagnosis not present

## 2019-10-24 DIAGNOSIS — Z789 Other specified health status: Secondary | ICD-10-CM | POA: Diagnosis not present

## 2019-10-24 DIAGNOSIS — Z6831 Body mass index (BMI) 31.0-31.9, adult: Secondary | ICD-10-CM | POA: Diagnosis not present

## 2019-10-24 DIAGNOSIS — E1122 Type 2 diabetes mellitus with diabetic chronic kidney disease: Secondary | ICD-10-CM | POA: Diagnosis not present

## 2019-10-24 DIAGNOSIS — Z299 Encounter for prophylactic measures, unspecified: Secondary | ICD-10-CM | POA: Diagnosis not present

## 2019-10-24 DIAGNOSIS — N183 Chronic kidney disease, stage 3 unspecified: Secondary | ICD-10-CM | POA: Diagnosis not present

## 2019-10-24 DIAGNOSIS — N182 Chronic kidney disease, stage 2 (mild): Secondary | ICD-10-CM | POA: Diagnosis not present

## 2019-10-24 DIAGNOSIS — I10 Essential (primary) hypertension: Secondary | ICD-10-CM | POA: Diagnosis not present

## 2019-10-24 DIAGNOSIS — E1165 Type 2 diabetes mellitus with hyperglycemia: Secondary | ICD-10-CM | POA: Diagnosis not present

## 2019-11-01 DIAGNOSIS — I951 Orthostatic hypotension: Secondary | ICD-10-CM | POA: Diagnosis not present

## 2019-11-01 DIAGNOSIS — R600 Localized edema: Secondary | ICD-10-CM | POA: Diagnosis not present

## 2019-11-01 DIAGNOSIS — R32 Unspecified urinary incontinence: Secondary | ICD-10-CM | POA: Diagnosis not present

## 2019-11-01 DIAGNOSIS — Z008 Encounter for other general examination: Secondary | ICD-10-CM | POA: Diagnosis not present

## 2019-11-01 DIAGNOSIS — E1151 Type 2 diabetes mellitus with diabetic peripheral angiopathy without gangrene: Secondary | ICD-10-CM | POA: Diagnosis not present

## 2019-11-01 DIAGNOSIS — E785 Hyperlipidemia, unspecified: Secondary | ICD-10-CM | POA: Diagnosis not present

## 2019-11-01 DIAGNOSIS — N400 Enlarged prostate without lower urinary tract symptoms: Secondary | ICD-10-CM | POA: Diagnosis not present

## 2019-11-01 DIAGNOSIS — E1165 Type 2 diabetes mellitus with hyperglycemia: Secondary | ICD-10-CM | POA: Diagnosis not present

## 2019-11-01 DIAGNOSIS — I10 Essential (primary) hypertension: Secondary | ICD-10-CM | POA: Diagnosis not present

## 2019-11-01 DIAGNOSIS — G3184 Mild cognitive impairment, so stated: Secondary | ICD-10-CM | POA: Diagnosis not present

## 2019-11-01 DIAGNOSIS — H547 Unspecified visual loss: Secondary | ICD-10-CM | POA: Diagnosis not present

## 2019-11-10 DIAGNOSIS — E1165 Type 2 diabetes mellitus with hyperglycemia: Secondary | ICD-10-CM | POA: Diagnosis not present

## 2019-11-10 DIAGNOSIS — N183 Chronic kidney disease, stage 3 unspecified: Secondary | ICD-10-CM | POA: Diagnosis not present

## 2019-11-10 DIAGNOSIS — I10 Essential (primary) hypertension: Secondary | ICD-10-CM | POA: Diagnosis not present

## 2019-11-10 DIAGNOSIS — Z299 Encounter for prophylactic measures, unspecified: Secondary | ICD-10-CM | POA: Diagnosis not present

## 2019-11-10 DIAGNOSIS — R609 Edema, unspecified: Secondary | ICD-10-CM | POA: Diagnosis not present

## 2019-11-29 DIAGNOSIS — E113313 Type 2 diabetes mellitus with moderate nonproliferative diabetic retinopathy with macular edema, bilateral: Secondary | ICD-10-CM | POA: Diagnosis not present

## 2019-11-29 DIAGNOSIS — H35372 Puckering of macula, left eye: Secondary | ICD-10-CM | POA: Diagnosis not present

## 2019-11-29 DIAGNOSIS — H43813 Vitreous degeneration, bilateral: Secondary | ICD-10-CM | POA: Diagnosis not present

## 2019-12-26 DIAGNOSIS — N183 Chronic kidney disease, stage 3 unspecified: Secondary | ICD-10-CM | POA: Diagnosis not present

## 2019-12-26 DIAGNOSIS — Z789 Other specified health status: Secondary | ICD-10-CM | POA: Diagnosis not present

## 2019-12-26 DIAGNOSIS — J209 Acute bronchitis, unspecified: Secondary | ICD-10-CM | POA: Diagnosis not present

## 2019-12-26 DIAGNOSIS — R002 Palpitations: Secondary | ICD-10-CM | POA: Diagnosis not present

## 2019-12-26 DIAGNOSIS — R0602 Shortness of breath: Secondary | ICD-10-CM | POA: Diagnosis not present

## 2020-01-11 DIAGNOSIS — Z299 Encounter for prophylactic measures, unspecified: Secondary | ICD-10-CM | POA: Diagnosis not present

## 2020-01-11 DIAGNOSIS — E1129 Type 2 diabetes mellitus with other diabetic kidney complication: Secondary | ICD-10-CM | POA: Diagnosis not present

## 2020-01-11 DIAGNOSIS — I509 Heart failure, unspecified: Secondary | ICD-10-CM | POA: Diagnosis not present

## 2020-01-11 DIAGNOSIS — E1165 Type 2 diabetes mellitus with hyperglycemia: Secondary | ICD-10-CM | POA: Diagnosis not present

## 2020-01-11 DIAGNOSIS — I10 Essential (primary) hypertension: Secondary | ICD-10-CM | POA: Diagnosis not present

## 2020-01-11 DIAGNOSIS — R0602 Shortness of breath: Secondary | ICD-10-CM | POA: Diagnosis not present

## 2020-01-24 DIAGNOSIS — E113391 Type 2 diabetes mellitus with moderate nonproliferative diabetic retinopathy without macular edema, right eye: Secondary | ICD-10-CM | POA: Diagnosis not present

## 2020-01-24 DIAGNOSIS — E113312 Type 2 diabetes mellitus with moderate nonproliferative diabetic retinopathy with macular edema, left eye: Secondary | ICD-10-CM | POA: Diagnosis not present

## 2020-01-24 DIAGNOSIS — H35372 Puckering of macula, left eye: Secondary | ICD-10-CM | POA: Diagnosis not present

## 2020-01-24 DIAGNOSIS — H3581 Retinal edema: Secondary | ICD-10-CM | POA: Diagnosis not present

## 2020-01-25 DIAGNOSIS — R69 Illness, unspecified: Secondary | ICD-10-CM | POA: Diagnosis not present

## 2020-01-31 DIAGNOSIS — Z299 Encounter for prophylactic measures, unspecified: Secondary | ICD-10-CM | POA: Diagnosis not present

## 2020-01-31 DIAGNOSIS — E1122 Type 2 diabetes mellitus with diabetic chronic kidney disease: Secondary | ICD-10-CM | POA: Diagnosis not present

## 2020-01-31 DIAGNOSIS — R609 Edema, unspecified: Secondary | ICD-10-CM | POA: Diagnosis not present

## 2020-01-31 DIAGNOSIS — E1165 Type 2 diabetes mellitus with hyperglycemia: Secondary | ICD-10-CM | POA: Diagnosis not present

## 2020-01-31 DIAGNOSIS — N183 Chronic kidney disease, stage 3 unspecified: Secondary | ICD-10-CM | POA: Diagnosis not present

## 2020-01-31 DIAGNOSIS — I10 Essential (primary) hypertension: Secondary | ICD-10-CM | POA: Diagnosis not present

## 2020-02-05 DIAGNOSIS — E113313 Type 2 diabetes mellitus with moderate nonproliferative diabetic retinopathy with macular edema, bilateral: Secondary | ICD-10-CM | POA: Diagnosis not present

## 2020-02-06 DIAGNOSIS — I5032 Chronic diastolic (congestive) heart failure: Secondary | ICD-10-CM | POA: Diagnosis not present

## 2020-02-06 DIAGNOSIS — E785 Hyperlipidemia, unspecified: Secondary | ICD-10-CM | POA: Diagnosis not present

## 2020-02-06 DIAGNOSIS — I10 Essential (primary) hypertension: Secondary | ICD-10-CM | POA: Diagnosis not present

## 2020-02-06 DIAGNOSIS — I429 Cardiomyopathy, unspecified: Secondary | ICD-10-CM | POA: Diagnosis not present

## 2020-02-06 DIAGNOSIS — I493 Ventricular premature depolarization: Secondary | ICD-10-CM | POA: Diagnosis not present

## 2020-02-14 DIAGNOSIS — R69 Illness, unspecified: Secondary | ICD-10-CM | POA: Diagnosis not present

## 2020-02-14 DIAGNOSIS — I10 Essential (primary) hypertension: Secondary | ICD-10-CM | POA: Diagnosis not present

## 2020-02-14 DIAGNOSIS — R002 Palpitations: Secondary | ICD-10-CM | POA: Diagnosis not present

## 2020-02-20 DIAGNOSIS — I493 Ventricular premature depolarization: Secondary | ICD-10-CM | POA: Diagnosis not present

## 2020-02-22 DIAGNOSIS — E1165 Type 2 diabetes mellitus with hyperglycemia: Secondary | ICD-10-CM | POA: Diagnosis not present

## 2020-02-22 DIAGNOSIS — I10 Essential (primary) hypertension: Secondary | ICD-10-CM | POA: Diagnosis not present

## 2020-02-22 DIAGNOSIS — E1122 Type 2 diabetes mellitus with diabetic chronic kidney disease: Secondary | ICD-10-CM | POA: Diagnosis not present

## 2020-02-22 DIAGNOSIS — Z299 Encounter for prophylactic measures, unspecified: Secondary | ICD-10-CM | POA: Diagnosis not present

## 2020-02-22 DIAGNOSIS — R609 Edema, unspecified: Secondary | ICD-10-CM | POA: Diagnosis not present

## 2020-03-12 DIAGNOSIS — R69 Illness, unspecified: Secondary | ICD-10-CM | POA: Diagnosis not present

## 2020-04-03 DIAGNOSIS — E113391 Type 2 diabetes mellitus with moderate nonproliferative diabetic retinopathy without macular edema, right eye: Secondary | ICD-10-CM | POA: Diagnosis not present

## 2020-04-03 DIAGNOSIS — E113312 Type 2 diabetes mellitus with moderate nonproliferative diabetic retinopathy with macular edema, left eye: Secondary | ICD-10-CM | POA: Diagnosis not present

## 2020-04-03 DIAGNOSIS — H3581 Retinal edema: Secondary | ICD-10-CM | POA: Diagnosis not present

## 2020-04-03 DIAGNOSIS — H35372 Puckering of macula, left eye: Secondary | ICD-10-CM | POA: Diagnosis not present

## 2020-04-10 DIAGNOSIS — Z299 Encounter for prophylactic measures, unspecified: Secondary | ICD-10-CM | POA: Diagnosis not present

## 2020-04-10 DIAGNOSIS — E1129 Type 2 diabetes mellitus with other diabetic kidney complication: Secondary | ICD-10-CM | POA: Diagnosis not present

## 2020-04-10 DIAGNOSIS — I739 Peripheral vascular disease, unspecified: Secondary | ICD-10-CM | POA: Diagnosis not present

## 2020-04-10 DIAGNOSIS — L02416 Cutaneous abscess of left lower limb: Secondary | ICD-10-CM | POA: Diagnosis not present

## 2020-04-10 DIAGNOSIS — E1122 Type 2 diabetes mellitus with diabetic chronic kidney disease: Secondary | ICD-10-CM | POA: Diagnosis not present

## 2020-04-12 DIAGNOSIS — I739 Peripheral vascular disease, unspecified: Secondary | ICD-10-CM | POA: Diagnosis not present

## 2020-04-12 DIAGNOSIS — Z87891 Personal history of nicotine dependence: Secondary | ICD-10-CM | POA: Diagnosis not present

## 2020-04-12 DIAGNOSIS — I509 Heart failure, unspecified: Secondary | ICD-10-CM | POA: Diagnosis not present

## 2020-04-12 DIAGNOSIS — Z299 Encounter for prophylactic measures, unspecified: Secondary | ICD-10-CM | POA: Diagnosis not present

## 2020-04-12 DIAGNOSIS — I10 Essential (primary) hypertension: Secondary | ICD-10-CM | POA: Diagnosis not present

## 2020-04-12 DIAGNOSIS — L02416 Cutaneous abscess of left lower limb: Secondary | ICD-10-CM | POA: Diagnosis not present

## 2020-04-12 DIAGNOSIS — I779 Disorder of arteries and arterioles, unspecified: Secondary | ICD-10-CM | POA: Diagnosis not present

## 2020-04-15 DIAGNOSIS — I10 Essential (primary) hypertension: Secondary | ICD-10-CM | POA: Diagnosis not present

## 2020-04-15 DIAGNOSIS — L02416 Cutaneous abscess of left lower limb: Secondary | ICD-10-CM | POA: Diagnosis not present

## 2020-04-15 DIAGNOSIS — Z299 Encounter for prophylactic measures, unspecified: Secondary | ICD-10-CM | POA: Diagnosis not present

## 2020-04-15 DIAGNOSIS — E1165 Type 2 diabetes mellitus with hyperglycemia: Secondary | ICD-10-CM | POA: Diagnosis not present

## 2020-04-15 DIAGNOSIS — N183 Chronic kidney disease, stage 3 unspecified: Secondary | ICD-10-CM | POA: Diagnosis not present

## 2020-04-15 DIAGNOSIS — E1122 Type 2 diabetes mellitus with diabetic chronic kidney disease: Secondary | ICD-10-CM | POA: Diagnosis not present

## 2020-04-19 DIAGNOSIS — R69 Illness, unspecified: Secondary | ICD-10-CM | POA: Diagnosis not present

## 2020-04-23 DIAGNOSIS — L02416 Cutaneous abscess of left lower limb: Secondary | ICD-10-CM | POA: Diagnosis not present

## 2020-04-23 DIAGNOSIS — I10 Essential (primary) hypertension: Secondary | ICD-10-CM | POA: Diagnosis not present

## 2020-04-23 DIAGNOSIS — Z299 Encounter for prophylactic measures, unspecified: Secondary | ICD-10-CM | POA: Diagnosis not present

## 2020-04-23 DIAGNOSIS — I509 Heart failure, unspecified: Secondary | ICD-10-CM | POA: Diagnosis not present

## 2020-05-01 DIAGNOSIS — Z0000 Encounter for general adult medical examination without abnormal findings: Secondary | ICD-10-CM | POA: Diagnosis not present

## 2020-05-01 DIAGNOSIS — I509 Heart failure, unspecified: Secondary | ICD-10-CM | POA: Diagnosis not present

## 2020-05-01 DIAGNOSIS — E7800 Pure hypercholesterolemia, unspecified: Secondary | ICD-10-CM | POA: Diagnosis not present

## 2020-05-01 DIAGNOSIS — Z79899 Other long term (current) drug therapy: Secondary | ICD-10-CM | POA: Diagnosis not present

## 2020-05-01 DIAGNOSIS — Z125 Encounter for screening for malignant neoplasm of prostate: Secondary | ICD-10-CM | POA: Diagnosis not present

## 2020-05-01 DIAGNOSIS — R5383 Other fatigue: Secondary | ICD-10-CM | POA: Diagnosis not present

## 2020-05-01 DIAGNOSIS — Z1331 Encounter for screening for depression: Secondary | ICD-10-CM | POA: Diagnosis not present

## 2020-05-01 DIAGNOSIS — Z1339 Encounter for screening examination for other mental health and behavioral disorders: Secondary | ICD-10-CM | POA: Diagnosis not present

## 2020-05-01 DIAGNOSIS — Z299 Encounter for prophylactic measures, unspecified: Secondary | ICD-10-CM | POA: Diagnosis not present

## 2020-05-01 DIAGNOSIS — Z6827 Body mass index (BMI) 27.0-27.9, adult: Secondary | ICD-10-CM | POA: Diagnosis not present

## 2020-05-01 DIAGNOSIS — I10 Essential (primary) hypertension: Secondary | ICD-10-CM | POA: Diagnosis not present

## 2020-05-01 DIAGNOSIS — Z7189 Other specified counseling: Secondary | ICD-10-CM | POA: Diagnosis not present

## 2020-05-08 DIAGNOSIS — Z6827 Body mass index (BMI) 27.0-27.9, adult: Secondary | ICD-10-CM | POA: Diagnosis not present

## 2020-05-08 DIAGNOSIS — E1122 Type 2 diabetes mellitus with diabetic chronic kidney disease: Secondary | ICD-10-CM | POA: Diagnosis not present

## 2020-05-08 DIAGNOSIS — I10 Essential (primary) hypertension: Secondary | ICD-10-CM | POA: Diagnosis not present

## 2020-05-08 DIAGNOSIS — Z299 Encounter for prophylactic measures, unspecified: Secondary | ICD-10-CM | POA: Diagnosis not present

## 2020-05-08 DIAGNOSIS — R972 Elevated prostate specific antigen [PSA]: Secondary | ICD-10-CM | POA: Diagnosis not present

## 2020-05-08 DIAGNOSIS — E1165 Type 2 diabetes mellitus with hyperglycemia: Secondary | ICD-10-CM | POA: Diagnosis not present

## 2020-05-09 DIAGNOSIS — E785 Hyperlipidemia, unspecified: Secondary | ICD-10-CM | POA: Diagnosis not present

## 2020-05-09 DIAGNOSIS — I5032 Chronic diastolic (congestive) heart failure: Secondary | ICD-10-CM | POA: Diagnosis not present

## 2020-05-09 DIAGNOSIS — I10 Essential (primary) hypertension: Secondary | ICD-10-CM | POA: Diagnosis not present

## 2020-05-13 DIAGNOSIS — I6529 Occlusion and stenosis of unspecified carotid artery: Secondary | ICD-10-CM | POA: Diagnosis not present

## 2020-05-13 DIAGNOSIS — Z0389 Encounter for observation for other suspected diseases and conditions ruled out: Secondary | ICD-10-CM | POA: Diagnosis not present

## 2020-05-16 DIAGNOSIS — I10 Essential (primary) hypertension: Secondary | ICD-10-CM | POA: Diagnosis not present

## 2020-05-16 DIAGNOSIS — R002 Palpitations: Secondary | ICD-10-CM | POA: Diagnosis not present

## 2020-05-28 DIAGNOSIS — R69 Illness, unspecified: Secondary | ICD-10-CM | POA: Diagnosis not present

## 2020-06-04 DIAGNOSIS — R69 Illness, unspecified: Secondary | ICD-10-CM | POA: Diagnosis not present

## 2020-06-14 DIAGNOSIS — I10 Essential (primary) hypertension: Secondary | ICD-10-CM | POA: Diagnosis not present

## 2020-06-14 DIAGNOSIS — R002 Palpitations: Secondary | ICD-10-CM | POA: Diagnosis not present

## 2020-06-26 DIAGNOSIS — E113312 Type 2 diabetes mellitus with moderate nonproliferative diabetic retinopathy with macular edema, left eye: Secondary | ICD-10-CM | POA: Diagnosis not present

## 2020-06-26 DIAGNOSIS — E113391 Type 2 diabetes mellitus with moderate nonproliferative diabetic retinopathy without macular edema, right eye: Secondary | ICD-10-CM | POA: Diagnosis not present

## 2020-06-26 DIAGNOSIS — H35033 Hypertensive retinopathy, bilateral: Secondary | ICD-10-CM | POA: Diagnosis not present

## 2020-06-26 DIAGNOSIS — H3582 Retinal ischemia: Secondary | ICD-10-CM | POA: Diagnosis not present

## 2020-07-01 ENCOUNTER — Other Ambulatory Visit: Payer: Self-pay

## 2020-07-02 DIAGNOSIS — Z299 Encounter for prophylactic measures, unspecified: Secondary | ICD-10-CM | POA: Diagnosis not present

## 2020-07-02 DIAGNOSIS — E1122 Type 2 diabetes mellitus with diabetic chronic kidney disease: Secondary | ICD-10-CM | POA: Diagnosis not present

## 2020-07-02 DIAGNOSIS — E1165 Type 2 diabetes mellitus with hyperglycemia: Secondary | ICD-10-CM | POA: Diagnosis not present

## 2020-07-02 DIAGNOSIS — I10 Essential (primary) hypertension: Secondary | ICD-10-CM | POA: Diagnosis not present

## 2020-07-02 DIAGNOSIS — Z6827 Body mass index (BMI) 27.0-27.9, adult: Secondary | ICD-10-CM | POA: Diagnosis not present

## 2020-07-02 DIAGNOSIS — I509 Heart failure, unspecified: Secondary | ICD-10-CM | POA: Diagnosis not present

## 2020-07-16 ENCOUNTER — Encounter: Payer: Self-pay | Admitting: Urology

## 2020-07-16 ENCOUNTER — Ambulatory Visit (INDEPENDENT_AMBULATORY_CARE_PROVIDER_SITE_OTHER): Payer: Medicare HMO | Admitting: Urology

## 2020-07-16 ENCOUNTER — Other Ambulatory Visit: Payer: Self-pay

## 2020-07-16 VITALS — BP 123/59 | HR 61 | Temp 97.9°F | Ht 74.0 in | Wt 214.0 lb

## 2020-07-16 DIAGNOSIS — R972 Elevated prostate specific antigen [PSA]: Secondary | ICD-10-CM

## 2020-07-16 LAB — URINALYSIS, ROUTINE W REFLEX MICROSCOPIC
Bilirubin, UA: NEGATIVE
Glucose, UA: NEGATIVE
Ketones, UA: NEGATIVE
Leukocytes,UA: NEGATIVE
Nitrite, UA: NEGATIVE
RBC, UA: NEGATIVE
Specific Gravity, UA: 1.02 (ref 1.005–1.030)
Urobilinogen, Ur: 0.2 mg/dL (ref 0.2–1.0)
pH, UA: 5 (ref 5.0–7.5)

## 2020-07-16 LAB — MICROSCOPIC EXAMINATION
Bacteria, UA: NONE SEEN
Epithelial Cells (non renal): NONE SEEN /hpf (ref 0–10)
RBC, Urine: NONE SEEN /hpf (ref 0–2)
Renal Epithel, UA: NONE SEEN /hpf
WBC, UA: NONE SEEN /hpf (ref 0–5)

## 2020-07-16 MED ORDER — LEVOFLOXACIN 750 MG PO TABS: 750.0000 mg | ORAL_TABLET | Freq: Once | ORAL | 0 refills | Status: AC

## 2020-07-16 NOTE — Progress Notes (Signed)
Bladder Scan Patient can void: 40 ml Performed By: Durenda Guthrie, Lpn  Urological Symptom Review  Patient is experiencing the following symptoms: Frequent urination Hard to postpone urination Get up at night to urinate Leakage of urine Stream starts and stops Trouble starting stream Weak stream Erection problems (male only)   Review of Systems  Gastrointestinal (upper)  : Negative for upper GI symptoms  Gastrointestinal (lower) : Negative for lower GI symptoms  Constitutional : Negative for symptoms  Skin: Negative for skin symptoms  Eyes: Blurred vision  Ear/Nose/Throat : Negative for Ear/Nose/Throat symptoms  Hematologic/Lymphatic: Negative for Hematologic/Lymphatic symptoms  Cardiovascular : Leg swelling  Respiratory : Shortness of breath  Endocrine: Negative for endocrine symptoms  Musculoskeletal: Negative for musculoskeletal symptoms  Neurological: Negative for neurological symptoms  Psychologic: Negative for psychiatric symptoms

## 2020-07-16 NOTE — Progress Notes (Signed)
07/16/2020 1:23 PM   Thomas Duarte [birth year] [id number]  Referring provider: Monico Blitz, MD [place],  [place] [address]  Elevated PSA  HPI: Mr Thomas Duarte is a 79yo here for evaluation of elevated PSA. PSa was 30.6 in 04/2020. His brother was treated for prostate cancer in his 49s. He has moderate LUTS on flomax 0.8mg . Nocturia 2x. No previous.     PMH: Past Medical History:  Diagnosis Date  . BPH (benign prostatic hyperplasia)   . Diabetes mellitus without complication ([place])   . Hypercholesteremia   . Hypertension   . 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 CATARACT EXTRACTION W/PHACO Left 10/21/2018   Procedure: CATARACT EXTRACTION PHACO AND INTRAOCULAR LENS PLACEMENT LEFT EYE;  Surgeon: Thomas Goldmann, MD;  Location: [facility];  Service: Ophthalmology;  Laterality: Left;  CDE: 5.18  . CATARACT EXTRACTION W/PHACO Right 06/23/2019   Procedure: CATARACT EXTRACTION PHACO AND INTRAOCULAR LENS PLACEMENT (IOC);  Surgeon: Thomas Goldmann, MD;  Location: [facility];  Service: Ophthalmology;  Laterality: Right;  CDE: 15.0  . EXPLORATORY LAPAROTOMY     removal of scar tissue.  Marland Kitchen HERNIA REPAIR Left 1960   Inguinal  . IR GENERIC HISTORICAL  04/28/2016   IR RADIOLOGIST EVAL & MGMT 04/28/2016 [facility]-INTERV RAD  . TONSILLECTOMY  1952   adenoidectomy    Home Medications:  Allergies as of 07/16/2020   No Known Allergies     Medication List       Accurate as of July 16, 2020  1:23 PM. If you have any questions, ask your nurse or doctor.        STOP taking these medications   amLODipine 5 MG tablet Commonly known as: NORVASC Stopped by: Thomas Bang, MD     TAKE these medications   aspirin 81 MG chewable tablet Chew by mouth daily. What changed: Another medication with the same name was removed. Continue taking this medication, and follow the  directions you see here. Changed by: Thomas Bang, MD   atorvastatin 40 MG tablet Commonly known as: LIPITOR Take 40 mg by mouth Duarte bedtime.   clopidogrel 75 MG tablet Commonly known as: PLAVIX Take 75 mg by mouth daily.   furosemide 40 MG tablet Commonly known as: LASIX Take 40 mg by mouth 2 (two) times daily.   Klor-Con M10 10 MEQ tablet Generic drug: potassium chloride Take by mouth.   lisinopril 40 MG tablet Commonly known as: ZESTRIL Take 40 mg by mouth daily.   metFORMIN 500 MG tablet Commonly known as: GLUCOPHAGE Take 500 mg by mouth 2 (two) times daily.   metoprolol succinate 25 MG 24 hr tablet Commonly known as: TOPROL-XL Take 25 mg by mouth daily.   tamsulosin 0.4 MG Caps capsule Commonly known as: FLOMAX Take 0.8 mg by mouth Duarte bedtime.   tobramycin 0.3 % ophthalmic solution Commonly known as: TOBREX SMARTSIG:In Eye(s)   Visine Dry Eye 0.2-0.2-1 % Soln Generic drug: Glycerin-Hypromellose-PEG 400 Place 1 drop into both eyes 3 (three) times daily as needed (dry eyes).       Allergies: No Known Allergies  Family History: History reviewed. No pertinent family history.  Social History:  reports that he has never smoked. He has never used smokeless tobacco. He reports that he does not drink alcohol and does not use drugs.  ROS: All other review of systems  were reviewed and are negative except what is noted above in HPI  Physical Exam: BP (!) 123/59   Pulse 61   Temp 97.9 F (36.6 C)   Ht 6\' 2"  (1.88 m)   Wt 214 lb (97.1 kg)   BMI 27.48 kg/m   Constitutional:  Alert and oriented, No acute distress. HEENT: Thomas Duarte, moist mucus membranes.  Trachea midline, no masses. Cardiovascular: No clubbing, cyanosis, or edema. Respiratory: Normal respiratory effort, no increased work of breathing. GI: Abdomen is soft, nontender, nondistended, no abdominal masses GU: No CVA tenderness. uncircumcised phallus. No masses/lesions on penis, testis, scrotum.  Prostate 40g nodular, no induration.  Lymph: No cervical or inguinal lymphadenopathy. Skin: No rashes, bruises or suspicious lesions. Neurologic: Grossly intact, no focal deficits, moving all 4 extremities. Psychiatric: Normal mood and affect.  Laboratory Data: No results found for: WBC, HGB, HCT, MCV, PLT  Lab Results  Component Value Date   CREATININE 1.30 (H) 06/21/2019    No results found for: PSA  No results found for: TESTOSTERONE  Lab Results  Component Value Date   HGBA1C 6.8 (H) 06/21/2019    Urinalysis No results found for: COLORURINE, APPEARANCEUR, LABSPEC, PHURINE, GLUCOSEU, HGBUR, BILIRUBINUR, KETONESUR, PROTEINUR, UROBILINOGEN, NITRITE, LEUKOCYTESUR  No results found for: LABMICR, [place], RBCUA, LABEPIT, MUCUS, BACTERIA  Pertinent Imaging:  No results found for this or any previous visit.  No results found for this or any previous visit.  No results found for this or any previous visit.  No results found for this or any previous visit.  No results found for this or any previous visit.  No results found for this or any previous visit.  No results found for this or any previous visit.  No results found for this or any previous visit.   Assessment & Plan:    1. Elevated PSA The patient and I talked about etiologies of elevated PSA.  We discussed the possible relationship between elevated PSA, prostate cancer, BPH, prostatitis, and UTI.   Conservative treatment of elevated PSA with watchful waiting was discussed with the patient.  All questions were answered.        All of the risks and benefits along with alternatives to prostate biopsy were discussed with the patient.  The patient gave fully informed consent to proceed with a transrectal ultrasound guided biopsy of the prostate for the evaluation of their evated PSA.  Prostate biopsy instructions and antibiotics were given to the patient.  - Urinalysis, Routine w reflex microscopic - BLADDER SCAN AMB  NON-IMAGING   No follow-ups on file.  Thomas Bang, MD  [facility]

## 2020-07-16 NOTE — Patient Instructions (Addendum)
Transrectal Ultrasound-Guided Prostate Biopsy This is a procedure to take samples of tissue from your prostate. Ultrasound images are used to guide the procedure. It is usually done to check for prostate cancer. What happens before the procedure? Staying hydrated Follow instructions about liquids. These may include:  Up to 2 hours before the procedure - you may drink clear liquids. This includes water, clear fruit juice, black coffee, and plain tea.  Eating and drinking restrictions Follow instructions about eating and drinking. These may include:  8 hours before the procedure - stop eating heavy meals or foods. This includes meat, fried foods, or fatty foods.  6 hours before the procedure - stop eating light meals or foods. This includes toast or cereal.  6 hours before the procedure - stop drinking milk or drinks that have milk.  2 hours before the procedure - stop drinking clear liquids. Medicines Ask your doctor about:  Changing or stopping your normal medicines. This is important if you take diabetes medicines or blood thinners.  Taking over-the-counter medicines, vitamins, herbs, and supplements.  Taking medicines such as aspirin and ibuprofen. These medicines can thin your blood. Do not take these medicines unless your doctor tells you to take them. General instructions  You may be given antibiotic medicine. If so, take it as told by your doctor.  Liquid will be used to clear waste from your butt (enema).  You may have a blood sample taken.  You may have a pee (urine) sample taken.  Plan to have someone take you home after your procedure. What happens during the procedure?   To lower your risk of infection: ? Your health care team will wash or sanitize their hands. ? Hair may be removed from the area. ? Your skin will be cleaned with soap. ? You will be given antibiotics.  An IV will be placed into one of your veins.  You will be given one or both of these: ? A  medicine to help you relax. ? A medicine to numb the area.  You will lie on your left side. Your knees will be bent.  A probe with gel on it will be placed in your butt. Pictures will be taken of your prostate and the area around it.  Medicine will be used to numb your prostate.  A needle will be placed in your butt and moved to your prostate.  Prostate tissue will be removed.  The samples will be sent to a lab. The procedure may vary. What happens after the procedure?  You will be watched until the medicines you were given have worn off.  You may have some pain in your butt. You will be given medicine for it. Summary  This procedure is usually done to check for prostate cancer.  Before the procedure, ask your doctor about changing or stopping your medicines.  You may have some pain in your butt. You will be given medicine for it.  Plan to have someone take you home after the procedure. This information is not intended to replace advice given to you by your health care provider. Make sure you discuss any questions you have with your health care provider. Document Revised: 11/23/2018 Document Reviewed: 10/30/2016 Elsevier Patient Education  [place].              Appointment Time:12:30 please arrive by 12:15 Appointment Date:08/21/20  Location: [facility]   Prostate Biopsy Instructions  Stop all aspirin or blood thinners (aspirin, plavix, coumadin,  warfarin, motrin, ibuprofen, advil, aleve, naproxen, naprosyn) for 7 days prior to the procedure.  If you have any questions about stopping these medications, please contact your primary care physician or cardiologist.  Having a light meal prior to the procedure is recommended.  If you are diabetic or have low blood sugar please bring a small snack or glucose tablet.  A Fleets enema is needed to be purchased over the counter at a local pharmacy and used 2 hours before you scheduled  appointment.  This can be purchased over the counter at any pharmacy.  Antibiotics will be administered in the clinic at the time of the procedure and 1 tablet has been sent to your pharmacy. Please take the antibiotic as prescribed.    Please bring someone with you to the procedure to drive you home if you are given a valium to take prior to your procedure.   If you have any questions or concerns, please feel free to call the office at [phone] or send a Mychart message.    Thank you, [facility]

## 2020-07-26 DIAGNOSIS — R69 Illness, unspecified: Secondary | ICD-10-CM | POA: Diagnosis not present

## 2020-08-06 DIAGNOSIS — E083313 Diabetes mellitus due to underlying condition with moderate nonproliferative diabetic retinopathy with macular edema, bilateral: Secondary | ICD-10-CM | POA: Diagnosis not present

## 2020-08-13 DIAGNOSIS — I509 Heart failure, unspecified: Secondary | ICD-10-CM | POA: Diagnosis not present

## 2020-08-13 DIAGNOSIS — Z299 Encounter for prophylactic measures, unspecified: Secondary | ICD-10-CM | POA: Diagnosis not present

## 2020-08-13 DIAGNOSIS — E1165 Type 2 diabetes mellitus with hyperglycemia: Secondary | ICD-10-CM | POA: Diagnosis not present

## 2020-08-13 DIAGNOSIS — I10 Essential (primary) hypertension: Secondary | ICD-10-CM | POA: Diagnosis not present

## 2020-08-13 DIAGNOSIS — E1122 Type 2 diabetes mellitus with diabetic chronic kidney disease: Secondary | ICD-10-CM | POA: Diagnosis not present

## 2020-08-16 DIAGNOSIS — R002 Palpitations: Secondary | ICD-10-CM | POA: Diagnosis not present

## 2020-08-16 DIAGNOSIS — I10 Essential (primary) hypertension: Secondary | ICD-10-CM | POA: Diagnosis not present

## 2020-08-20 DIAGNOSIS — H35033 Hypertensive retinopathy, bilateral: Secondary | ICD-10-CM | POA: Diagnosis not present

## 2020-08-20 DIAGNOSIS — E113312 Type 2 diabetes mellitus with moderate nonproliferative diabetic retinopathy with macular edema, left eye: Secondary | ICD-10-CM | POA: Diagnosis not present

## 2020-08-20 DIAGNOSIS — E113391 Type 2 diabetes mellitus with moderate nonproliferative diabetic retinopathy without macular edema, right eye: Secondary | ICD-10-CM | POA: Diagnosis not present

## 2020-08-20 DIAGNOSIS — H3582 Retinal ischemia: Secondary | ICD-10-CM | POA: Diagnosis not present

## 2020-08-21 ENCOUNTER — Other Ambulatory Visit: Payer: Self-pay | Admitting: Urology

## 2020-08-21 ENCOUNTER — Ambulatory Visit (HOSPITAL_COMMUNITY)
Admission: RE | Admit: 2020-08-21 | Discharge: 2020-08-21 | Disposition: A | Discharge status: Home / Self Care | Payer: Medicare HMO | Source: Ambulatory Visit | Attending: Urology | Admitting: Urology

## 2020-08-21 ENCOUNTER — Other Ambulatory Visit: Payer: Self-pay

## 2020-08-21 ENCOUNTER — Other Ambulatory Visit: Payer: Medicare HMO | Admitting: Urology

## 2020-08-21 ENCOUNTER — Encounter (HOSPITAL_COMMUNITY): Payer: Self-pay

## 2020-08-21 DIAGNOSIS — R972 Elevated prostate specific antigen [PSA]: Secondary | ICD-10-CM | POA: Diagnosis not present

## 2020-08-21 MED ORDER — GENTAMICIN SULFATE 40 MG/ML IJ SOLN: 80.0000 mg | Freq: Once | INTRAMUSCULAR | Status: AC

## 2020-08-21 MED ORDER — LIDOCAINE HCL (PF) 2 % IJ SOLN: 10.0000 mL | Freq: Once | INTRAMUSCULAR | Status: DC

## 2020-08-21 MED ORDER — GENTAMICIN SULFATE 40 MG/ML IJ SOLN
INTRAMUSCULAR | Status: AC
  Administered 2020-08-21: 80 mg via INTRAMUSCULAR
  Filled 2020-08-21: qty 2

## 2020-08-21 MED ORDER — LIDOCAINE HCL (PF) 2 % IJ SOLN
INTRAMUSCULAR | Status: AC
  Filled 2020-08-21: qty 10

## 2020-08-21 MED ORDER — LEVOFLOXACIN 750 MG PO TABS: 750.0000 mg | ORAL_TABLET | Freq: Once | ORAL | 0 refills | Status: AC

## 2020-08-21 NOTE — Discharge Instructions (Signed)

## 2020-08-21 NOTE — Sedation Documentation (Signed)
Prostate Biopsy held today due to patient taking Plavix daily and not held. PT verbalized understanding of the need to hold plavix and aspirin prior to procedure and procedure rescheduled. PT tolerated IM injection of antibiotics.

## 2020-08-24 ENCOUNTER — Other Ambulatory Visit: Payer: Self-pay | Admitting: Urology

## 2020-08-26 ENCOUNTER — Other Ambulatory Visit: Payer: Self-pay

## 2020-08-26 ENCOUNTER — Encounter: Payer: Self-pay | Admitting: Urology

## 2020-08-26 ENCOUNTER — Ambulatory Visit (HOSPITAL_COMMUNITY)
Admission: RE | Admit: 2020-08-26 | Discharge: 2020-08-26 | Disposition: A | Discharge status: Home / Self Care | Payer: Medicare HMO | Source: Ambulatory Visit | Attending: Urology | Admitting: Urology

## 2020-08-26 ENCOUNTER — Other Ambulatory Visit: Payer: Self-pay | Admitting: Urology

## 2020-08-26 ENCOUNTER — Ambulatory Visit (INDEPENDENT_AMBULATORY_CARE_PROVIDER_SITE_OTHER): Payer: Medicare HMO | Admitting: Urology

## 2020-08-26 ENCOUNTER — Encounter (HOSPITAL_COMMUNITY): Payer: Self-pay

## 2020-08-26 DIAGNOSIS — R972 Elevated prostate specific antigen [PSA]: Secondary | ICD-10-CM

## 2020-08-26 DIAGNOSIS — C61 Malignant neoplasm of prostate: Secondary | ICD-10-CM | POA: Insufficient documentation

## 2020-08-26 MED ORDER — LIDOCAINE HCL (PF) 1 % IJ SOLN
10.0000 mL | Freq: Once | INTRAMUSCULAR | Status: AC
  Administered 2020-08-26: 10 mL

## 2020-08-26 MED ORDER — GENTAMICIN SULFATE 40 MG/ML IJ SOLN
INTRAMUSCULAR | Status: AC
  Administered 2020-08-26: 80 mg via INTRAMUSCULAR
  Filled 2020-08-26: qty 2

## 2020-08-26 MED ORDER — LIDOCAINE HCL (PF) 2 % IJ SOLN
INTRAMUSCULAR | Status: AC
  Filled 2020-08-26: qty 10

## 2020-08-26 MED ORDER — GENTAMICIN SULFATE 40 MG/ML IJ SOLN: 80.0000 mg | Freq: Once | INTRAMUSCULAR | Status: AC

## 2020-08-26 NOTE — Sedation Documentation (Signed)
PT tolerated prostate biopsy procedure well today. Labs obtained and sent for pathology at this time. PT ambulatory at discharge with no acute distress noted and verbalized understanding of discharge instructions. PT to follow up with urologist as scheduled.

## 2020-08-26 NOTE — Discharge Instructions (Signed)

## 2020-08-26 NOTE — Progress Notes (Signed)
Prostate Biopsy Procedure   Informed consent was obtained after discussing risks/benefits of the procedure.  A time out was performed to ensure correct patient identity.  Pre-Procedure: - Last PSA Level: No results found for: PSA - Gentamicin given prophylactically - Levaquin 500 mg administered PO -Transrectal Ultrasound performed revealing a 63.3 gm prostate -No significant hypoechoic or median lobe noted  Procedure: - Prostate block performed using 10 cc 1% lidocaine and biopsies taken from sextant areas, a total of 12 under ultrasound guidance.  Post-Procedure: - Patient tolerated the procedure well - He was counseled to seek immediate medical attention if experiences any severe pain, significant bleeding, or fevers - Return in one week to discuss biopsy results

## 2020-08-26 NOTE — Patient Instructions (Signed)

## 2020-08-27 ENCOUNTER — Ambulatory Visit: Payer: Medicare HMO | Admitting: Urology

## 2020-08-28 ENCOUNTER — Encounter: Payer: Self-pay | Admitting: Urology

## 2020-09-09 ENCOUNTER — Ambulatory Visit (INDEPENDENT_AMBULATORY_CARE_PROVIDER_SITE_OTHER): Payer: Medicare HMO | Admitting: Urology

## 2020-09-09 ENCOUNTER — Encounter: Payer: Self-pay | Admitting: Urology

## 2020-09-09 ENCOUNTER — Other Ambulatory Visit: Payer: Self-pay

## 2020-09-09 VITALS — BP 165/71 | HR 65 | Temp 98.1°F

## 2020-09-09 DIAGNOSIS — E785 Hyperlipidemia, unspecified: Secondary | ICD-10-CM | POA: Diagnosis not present

## 2020-09-09 DIAGNOSIS — C61 Malignant neoplasm of prostate: Secondary | ICD-10-CM | POA: Insufficient documentation

## 2020-09-09 DIAGNOSIS — Z8249 Family history of ischemic heart disease and other diseases of the circulatory system: Secondary | ICD-10-CM | POA: Diagnosis not present

## 2020-09-09 DIAGNOSIS — Z7902 Long term (current) use of antithrombotics/antiplatelets: Secondary | ICD-10-CM | POA: Diagnosis not present

## 2020-09-09 DIAGNOSIS — Z7984 Long term (current) use of oral hypoglycemic drugs: Secondary | ICD-10-CM | POA: Diagnosis not present

## 2020-09-09 DIAGNOSIS — R32 Unspecified urinary incontinence: Secondary | ICD-10-CM | POA: Diagnosis not present

## 2020-09-09 DIAGNOSIS — E1151 Type 2 diabetes mellitus with diabetic peripheral angiopathy without gangrene: Secondary | ICD-10-CM | POA: Diagnosis not present

## 2020-09-09 DIAGNOSIS — Z008 Encounter for other general examination: Secondary | ICD-10-CM | POA: Diagnosis not present

## 2020-09-09 DIAGNOSIS — Z7982 Long term (current) use of aspirin: Secondary | ICD-10-CM | POA: Diagnosis not present

## 2020-09-09 DIAGNOSIS — I10 Essential (primary) hypertension: Secondary | ICD-10-CM | POA: Diagnosis not present

## 2020-09-09 DIAGNOSIS — Z803 Family history of malignant neoplasm of breast: Secondary | ICD-10-CM | POA: Diagnosis not present

## 2020-09-09 DIAGNOSIS — N400 Enlarged prostate without lower urinary tract symptoms: Secondary | ICD-10-CM | POA: Diagnosis not present

## 2020-09-09 NOTE — Patient Instructions (Signed)
Prostate Cancer  The prostate is a male gland that helps make semen. It is located below a man's bladder, in front of the rectum. Prostate cancer is when abnormal cells grow in this gland. What are the causes? The cause of this condition is not known. What increases the risk? You are more likely to develop this condition if:  You are 81 years of age or older.  You are African American.  You have a family history of prostate cancer.  You have a family history of breast cancer. What are the signs or symptoms? Symptoms of this condition include:  A need to pee often.  Peeing that is weak, or pee that stops and starts.  Trouble starting or stopping your pee.  Inability to pee.  Blood in your pee or semen.  Pain in the lower back, lower belly (abdomen), hips, or upper thighs.  Trouble getting an erection.  Trouble emptying all of your pee. How is this treated? Treatment for this condition depends on your age, your health, the kind of treatment you like, and how far the cancer has spread. Treatments include:  Being watched. This is called observation. You will be tested from time to time, but you will not get treated. Tests are to make sure that the cancer is not growing.  Surgery. This may be done to remove the prostate, to remove the testicles, or to freeze or kill cancer cells.  Radiation. This uses a strong beam to kill cancer cells.  Ultrasound energy. This uses strong sound waves to kill cancer cells.  Chemotherapy. This uses medicines that stop cancer cells from increasing. This kills cancer cells and healthy cells.  Targeted therapy. This kills cancer cells only. Healthy cells are not affected.  Hormone treatment. This stops the body from making hormones that help the cancer cells to grow. Follow these instructions at home:  Take over-the-counter and prescription medicines only as told by your doctor.  Eat a healthy diet.  Get plenty of sleep.  Ask your  doctor for help to find a support group for men with prostate cancer.  If you have to go to the hospital, let your cancer doctor (oncologist) know.  Treatment may affect your ability to have sex. Touch, hold, hug, and caress your partner to have intimate moments.  Keep all follow-up visits as told by your doctor. This is important. Contact a doctor if:  You have new or more trouble peeing.  You have new or more blood in your pee.  You have new or more pain in your hips, back, or chest. Get help right away if:  You have weakness in your legs.  You lose feeling in your legs.  You cannot control your pee or your poop (stool).  You have chills or a fever. Summary  The prostate is a male gland that helps make semen.  Prostate cancer is when abnormal cells grow in this gland.  Treatment includes doing surgery, using medicines, using very strong beams, or watching without treatment.  Ask your doctor for help to find a support group for men with prostate cancer.  Contact a doctor if you have problems peeing or have any new pain that you did not have before. This information is not intended to replace advice given to you by your health care provider. Make sure you discuss any questions you have with your health care provider. Document Revised: 07/18/2019 Document Reviewed: 07/18/2019 Elsevier Patient Education  2021 Elsevier Inc.  

## 2020-09-09 NOTE — Progress Notes (Signed)
09/09/2020 3:59 PM   ADVIT TRETHEWEY 08-07-1941 [id number]  Referring provider: Monico Blitz, MD [address] [place],  [place] [address]  followup prostate cancer  HPI: Mr Cookson is a 80yo here for followup after prostate biopsy. Pathology revealed Gleason 5+4=9 in 4/12 cores. Mild LUts. Patient has ED.    PMH: Past Medical History:  Diagnosis Date  . BPH (benign prostatic hyperplasia)   . Diabetes mellitus without complication ([place])   . Hypercholesteremia   . Hypertension   . Stroke ([place])    x2; numbness of right thumb and pointer and middle finger, no other deficits    Surgical History: Past Surgical History:  Procedure Laterality Date  . CATARACT EXTRACTION W/PHACO Left 10/21/2018   Procedure: CATARACT EXTRACTION PHACO AND INTRAOCULAR LENS PLACEMENT LEFT EYE;  Surgeon: Baruch Goldmann, MD;  Location: [facility];  Service: Ophthalmology;  Laterality: Left;  CDE: 5.18  . CATARACT EXTRACTION W/PHACO Right 06/23/2019   Procedure: CATARACT EXTRACTION PHACO AND INTRAOCULAR LENS PLACEMENT (IOC);  Surgeon: Baruch Goldmann, MD;  Location: [facility];  Service: Ophthalmology;  Laterality: Right;  CDE: 15.0  . EXPLORATORY LAPAROTOMY     removal of scar tissue.  Marland Kitchen HERNIA REPAIR Left 1960   Inguinal  . IR GENERIC HISTORICAL  04/28/2016   IR RADIOLOGIST EVAL & MGMT 04/28/2016 [facility]  . TONSILLECTOMY  1952   adenoidectomy    Home Medications:  Allergies as of 09/09/2020   No Known Allergies     Medication List       Accurate as of September 09, 2020  3:59 PM. If you have any questions, ask your nurse or doctor.        aspirin 81 MG chewable tablet Chew by mouth daily.   atorvastatin 40 MG tablet Commonly known as: LIPITOR Take 40 mg by mouth at bedtime.   clopidogrel 75 MG tablet Commonly known as: PLAVIX Take 75 mg by mouth daily.   furosemide 40 MG tablet Commonly known as: LASIX Take 40 mg by mouth 2 (two) times daily.   Klor-Con M10 10 MEQ tablet Generic drug:  potassium chloride Take by mouth.   lisinopril 40 MG tablet Commonly known as: ZESTRIL Take 40 mg by mouth daily.   metFORMIN 500 MG tablet Commonly known as: GLUCOPHAGE Take 500 mg by mouth 2 (two) times daily.   metoprolol succinate 25 MG 24 hr tablet Commonly known as: TOPROL-XL Take 25 mg by mouth daily.   tamsulosin 0.4 MG Caps capsule Commonly known as: FLOMAX Take 0.8 mg by mouth at bedtime.   tobramycin 0.3 % ophthalmic solution Commonly known as: TOBREX SMARTSIG:In Eye(s)   Visine Dry Eye 0.2-0.2-1 % Soln Generic drug: Glycerin-Hypromellose-PEG 400 Place 1 drop into both eyes 3 (three) times daily as needed (dry eyes).       Allergies: No Known Allergies  Family History: No family history on file.  Social History:  reports that he has never smoked. He has never used smokeless tobacco. He reports that he does not drink alcohol and does not use drugs.  ROS: All other review of systems were reviewed and are negative except what is noted above in HPI  Physical Exam: BP (!) 165/71   Pulse 65   Temp 98.1 F (36.7 C)   Constitutional:  Alert and oriented, No acute distress. HEENT: [place] AT, moist mucus membranes.  Trachea midline, no masses. Cardiovascular: No clubbing, cyanosis, or edema. Respiratory: Normal respiratory effort, no increased work of breathing. GI: Abdomen is soft,  nontender, nondistended, no abdominal masses GU: No CVA tenderness.  Lymph: No cervical or inguinal lymphadenopathy. Skin: No rashes, bruises or suspicious lesions. Neurologic: Grossly intact, no focal deficits, moving all 4 extremities. Psychiatric: Normal mood and affect.  Laboratory Data: No results found for: WBC, HGB, HCT, MCV, PLT  Lab Results  Component Value Date   CREATININE 1.30 (H) 06/21/2019    No results found for: PSA  No results found for: TESTOSTERONE  Lab Results  Component Value Date   HGBA1C 6.8 (H) 06/21/2019    Urinalysis    Component Value  Date/Time   APPEARANCEUR Clear 07/16/2020 1324   GLUCOSEU Negative 07/16/2020 1324   BILIRUBINUR Negative 07/16/2020 1324   PROTEINUR 2+ (A) 07/16/2020 1324   NITRITE Negative 07/16/2020 1324   LEUKOCYTESUR Negative 07/16/2020 1324    Lab Results  Component Value Date   LABMICR See below: 07/16/2020   WBCUA None seen 07/16/2020   LABEPIT None seen 07/16/2020   MUCUS Present 07/16/2020   BACTERIA None seen 07/16/2020    Pertinent Imaging:  No results found for this or any previous visit.  No results found for this or any previous visit.  No results found for this or any previous visit.  No results found for this or any previous visit.  No results found for this or any previous visit.  No results found for this or any previous visit.  No results found for this or any previous visit.  No results found for this or any previous visit.   Assessment & Plan:    1. Prostate cancer [facility]) I discussed the natural history of high risk prostate cancer with the patient and the various treatment options including active surveillance, RALP, IMRT, brachytherapy, cryotherapy, HIFU and ADT. After discussing the options the patient elects for IMRT. I will have him see Dr. Adella Nissen in [place] for consideration. CT and bone scan prior to radiation oncology appointment   No follow-ups on file.  Nicolette Bang, MD  [facility] [place]

## 2020-09-10 LAB — BASIC METABOLIC PANEL
BUN/Creatinine Ratio: 22 (ref 10–24)
BUN: 39 mg/dL — ABNORMAL HIGH (ref 8–27)
CO2: 21 mmol/L (ref 20–29)
Calcium: 8.8 mg/dL (ref 8.6–10.2)
Chloride: 106 mmol/L (ref 96–106)
Creatinine, Ser: 1.79 mg/dL — ABNORMAL HIGH (ref 0.76–1.27)
GFR calc Af Amer: 40 mL/min/{1.73_m2} — ABNORMAL LOW (ref >59)
GFR calc non Af Amer: 35 mL/min/{1.73_m2} — ABNORMAL LOW (ref >59)
Glucose: 149 mg/dL — ABNORMAL HIGH (ref 65–99)
Potassium: 4.7 mmol/L (ref 3.5–5.2)
Sodium: 141 mmol/L (ref 134–144)

## 2020-09-13 DIAGNOSIS — E7800 Pure hypercholesterolemia, unspecified: Secondary | ICD-10-CM | POA: Diagnosis not present

## 2020-09-13 DIAGNOSIS — N400 Enlarged prostate without lower urinary tract symptoms: Secondary | ICD-10-CM | POA: Diagnosis not present

## 2020-09-13 DIAGNOSIS — Z7982 Long term (current) use of aspirin: Secondary | ICD-10-CM | POA: Diagnosis not present

## 2020-09-13 DIAGNOSIS — N189 Chronic kidney disease, unspecified: Secondary | ICD-10-CM | POA: Diagnosis not present

## 2020-09-13 DIAGNOSIS — C61 Malignant neoplasm of prostate: Secondary | ICD-10-CM | POA: Diagnosis not present

## 2020-09-13 DIAGNOSIS — I130 Hypertensive heart and chronic kidney disease with heart failure and stage 1 through stage 4 chronic kidney disease, or unspecified chronic kidney disease: Secondary | ICD-10-CM | POA: Diagnosis not present

## 2020-09-13 DIAGNOSIS — E1122 Type 2 diabetes mellitus with diabetic chronic kidney disease: Secondary | ICD-10-CM | POA: Diagnosis not present

## 2020-09-13 DIAGNOSIS — Z7902 Long term (current) use of antithrombotics/antiplatelets: Secondary | ICD-10-CM | POA: Diagnosis not present

## 2020-09-13 DIAGNOSIS — I509 Heart failure, unspecified: Secondary | ICD-10-CM | POA: Diagnosis not present

## 2020-09-13 DIAGNOSIS — I2510 Atherosclerotic heart disease of native coronary artery without angina pectoris: Secondary | ICD-10-CM | POA: Diagnosis not present

## 2020-09-16 DIAGNOSIS — R002 Palpitations: Secondary | ICD-10-CM | POA: Diagnosis not present

## 2020-09-16 DIAGNOSIS — I10 Essential (primary) hypertension: Secondary | ICD-10-CM | POA: Diagnosis not present

## 2020-10-02 ENCOUNTER — Ambulatory Visit (HOSPITAL_COMMUNITY)
Admission: RE | Admit: 2020-10-02 | Discharge: 2020-10-02 | Disposition: A | Discharge status: Home / Self Care | Payer: Medicare HMO | Source: Ambulatory Visit | Attending: Urology | Admitting: Urology

## 2020-10-02 ENCOUNTER — Other Ambulatory Visit: Payer: Self-pay

## 2020-10-02 DIAGNOSIS — C61 Malignant neoplasm of prostate: Secondary | ICD-10-CM | POA: Diagnosis not present

## 2020-10-02 DIAGNOSIS — I700 Atherosclerosis of aorta: Secondary | ICD-10-CM | POA: Diagnosis not present

## 2020-10-02 DIAGNOSIS — K449 Diaphragmatic hernia without obstruction or gangrene: Secondary | ICD-10-CM | POA: Diagnosis not present

## 2020-10-02 MED ORDER — IOHEXOL 300 MG/ML  SOLN
80.0000 mL | Freq: Once | INTRAMUSCULAR | Status: AC | PRN
  Administered 2020-10-02: 80 mL via INTRAVENOUS

## 2020-10-02 MED ORDER — TECHNETIUM TC 99M MEDRONATE IV KIT
20.0000 | PACK | Freq: Once | INTRAVENOUS | Status: AC | PRN
  Administered 2020-10-02: 20 via INTRAVENOUS

## 2020-10-09 NOTE — Progress Notes (Signed)
Results sent via my chart 

## 2020-10-15 DIAGNOSIS — H35372 Puckering of macula, left eye: Secondary | ICD-10-CM | POA: Diagnosis not present

## 2020-10-15 DIAGNOSIS — E113312 Type 2 diabetes mellitus with moderate nonproliferative diabetic retinopathy with macular edema, left eye: Secondary | ICD-10-CM | POA: Diagnosis not present

## 2020-10-15 DIAGNOSIS — H43813 Vitreous degeneration, bilateral: Secondary | ICD-10-CM | POA: Diagnosis not present

## 2020-10-15 DIAGNOSIS — E113313 Type 2 diabetes mellitus with moderate nonproliferative diabetic retinopathy with macular edema, bilateral: Secondary | ICD-10-CM | POA: Diagnosis not present

## 2020-10-16 ENCOUNTER — Other Ambulatory Visit: Payer: Self-pay

## 2020-10-16 ENCOUNTER — Encounter: Payer: Self-pay | Admitting: Urology

## 2020-10-16 ENCOUNTER — Ambulatory Visit (INDEPENDENT_AMBULATORY_CARE_PROVIDER_SITE_OTHER): Payer: Medicare HMO | Admitting: Urology

## 2020-10-16 VITALS — BP 173/73 | HR 62 | Temp 98.8°F | Ht 74.0 in | Wt 214.0 lb

## 2020-10-16 DIAGNOSIS — C61 Malignant neoplasm of prostate: Secondary | ICD-10-CM | POA: Diagnosis not present

## 2020-10-16 LAB — URINALYSIS, ROUTINE W REFLEX MICROSCOPIC
Bilirubin, UA: NEGATIVE
Glucose, UA: NEGATIVE
Ketones, UA: NEGATIVE
Leukocytes,UA: NEGATIVE
Nitrite, UA: NEGATIVE
RBC, UA: NEGATIVE
Specific Gravity, UA: 1.015 (ref 1.005–1.030)
Urobilinogen, Ur: 0.2 mg/dL (ref 0.2–1.0)
pH, UA: 5 (ref 5.0–7.5)

## 2020-10-16 NOTE — Patient Instructions (Signed)
Prostate Cancer  The prostate is a male gland that helps make semen. It is located below a man's bladder, in front of the rectum. Prostate cancer is when abnormal cells grow in this gland. What are the causes? The cause of this condition is not known. What increases the risk? You are more likely to develop this condition if:  You are 81 years of age or older.  You are African American.  You have a family history of prostate cancer.  You have a family history of breast cancer. What are the signs or symptoms? Symptoms of this condition include:  A need to pee often.  Peeing that is weak, or pee that stops and starts.  Trouble starting or stopping your pee.  Inability to pee.  Blood in your pee or semen.  Pain in the lower back, lower belly (abdomen), hips, or upper thighs.  Trouble getting an erection.  Trouble emptying all of your pee. How is this treated? Treatment for this condition depends on your age, your health, the kind of treatment you like, and how far the cancer has spread. Treatments include:  Being watched. This is called observation. You will be tested from time to time, but you will not get treated. Tests are to make sure that the cancer is not growing.  Surgery. This may be done to remove the prostate, to remove the testicles, or to freeze or kill cancer cells.  Radiation. This uses a strong beam to kill cancer cells.  Ultrasound energy. This uses strong sound waves to kill cancer cells.  Chemotherapy. This uses medicines that stop cancer cells from increasing. This kills cancer cells and healthy cells.  Targeted therapy. This kills cancer cells only. Healthy cells are not affected.  Hormone treatment. This stops the body from making hormones that help the cancer cells to grow. Follow these instructions at home:  Take over-the-counter and prescription medicines only as told by your doctor.  Eat a healthy diet.  Get plenty of sleep.  Ask your  doctor for help to find a support group for men with prostate cancer.  If you have to go to the hospital, let your cancer doctor (oncologist) know.  Treatment may affect your ability to have sex. Touch, hold, hug, and caress your partner to have intimate moments.  Keep all follow-up visits as told by your doctor. This is important. Contact a doctor if:  You have new or more trouble peeing.  You have new or more blood in your pee.  You have new or more pain in your hips, back, or chest. Get help right away if:  You have weakness in your legs.  You lose feeling in your legs.  You cannot control your pee or your poop (stool).  You have chills or a fever. Summary  The prostate is a male gland that helps make semen.  Prostate cancer is when abnormal cells grow in this gland.  Treatment includes doing surgery, using medicines, using very strong beams, or watching without treatment.  Ask your doctor for help to find a support group for men with prostate cancer.  Contact a doctor if you have problems peeing or have any new pain that you did not have before. This information is not intended to replace advice given to you by your health care provider. Make sure you discuss any questions you have with your health care provider. Document Revised: 07/18/2019 Document Reviewed: 07/18/2019 Elsevier Patient Education  2021 Elsevier Inc.  

## 2020-10-16 NOTE — Progress Notes (Signed)
10/16/2020 4:57 PM   Thomas Duarte 26-Dec-1940 [id number]  Referring provider: Monico Blitz, MD [address] Thomas Duarte,  Thomas Duarte Thomas Duarte  Followup prostate cancer  HPI: Mr Thomas Duarte is a 80yo here for followup for high risk prostate cancer. He met with Dr. Adella Duarte and has elected to proceed with IMRT and long term ADT. He denies any significant LUTS. No hematuria. CT abd/pelvis and Bone scan negative for metastatic disease.    PMH: Past Medical History:  Diagnosis Date  . BPH (benign prostatic hyperplasia)   . Diabetes mellitus without complication (Thomas Duarte)   . Hypercholesteremia   . Hypertension   . Stroke (Thomas Duarte)    x2; numbness of right thumb and pointer and middle finger, no other deficits    Surgical History: Past Surgical History:  Procedure Laterality Date  . CATARACT EXTRACTION W/PHACO Left 10/21/2018   Procedure: CATARACT EXTRACTION PHACO AND INTRAOCULAR LENS PLACEMENT LEFT EYE;  Surgeon: Thomas Goldmann, MD;  Location: [facility];  Service: Ophthalmology;  Laterality: Left;  CDE: 5.18  . CATARACT EXTRACTION W/PHACO Right 06/23/2019   Procedure: CATARACT EXTRACTION PHACO AND INTRAOCULAR LENS PLACEMENT (IOC);  Surgeon: Thomas Goldmann, MD;  Location: [facility];  Service: Ophthalmology;  Laterality: Right;  CDE: 15.0  . EXPLORATORY LAPAROTOMY     removal of scar tissue.  Marland Kitchen HERNIA REPAIR Left 1960   Inguinal  . IR GENERIC HISTORICAL  04/28/2016   IR RADIOLOGIST EVAL & MGMT 04/28/2016 [facility]  . TONSILLECTOMY  1952   adenoidectomy    Home Medications:  Allergies as of 10/16/2020   No Known Allergies     Medication List       Accurate as of October 16, 2020  4:57 PM. If you have any questions, ask your nurse or doctor.        aspirin 81 MG chewable tablet Chew by mouth daily.   atorvastatin 40 MG tablet Commonly known as: LIPITOR Take 40 mg by mouth at bedtime.   clopidogrel 75 MG tablet Commonly known as: PLAVIX Take 75 mg by mouth daily.   furosemide 40 MG  tablet Commonly known as: LASIX Take 40 mg by mouth 2 (two) times daily.   Glycerin-Hypromellose-PEG 400 0.2-0.2-1 % Soln Place 1 drop into both eyes 3 (three) times daily as needed (dry eyes).   Klor-Con M10 10 MEQ tablet Generic drug: potassium chloride Take by mouth.   lisinopril 40 MG tablet Commonly known as: ZESTRIL Take 40 mg by mouth daily.   metFORMIN 500 MG tablet Commonly known as: GLUCOPHAGE Take 500 mg by mouth 2 (two) times daily.   metoprolol succinate 25 MG 24 hr tablet Commonly known as: TOPROL-XL Take 25 mg by mouth daily.   OneTouch Delica Plus [id number] Misc SMARTSIG:1 Strip(s) Topical Daily   OneTouch Verio test strip Generic drug: glucose blood 1 each daily.   tamsulosin 0.4 MG Caps capsule Commonly known as: FLOMAX Take 0.8 mg by mouth at bedtime.   tobramycin 0.3 % ophthalmic solution Commonly known as: TOBREX SMARTSIG:In Eye(s)       Allergies: No Known Allergies  Family History: No family history on file.  Social History:  reports that he has never smoked. He has never used smokeless tobacco. He reports that he does not drink alcohol and does not use drugs.  ROS: All other review of systems were reviewed and are negative except what is noted above in HPI  Physical Exam: BP (!) 173/73   Pulse 62   Temp 98.8 F (37.1  C)   Ht 6' 2"  (1.88 m)   Wt 214 lb (97.1 kg)   BMI 27.48 kg/m   Constitutional:  Alert and oriented, No acute distress. HEENT: Thomas Duarte AT, moist mucus membranes.  Trachea midline, no masses. Cardiovascular: No clubbing, cyanosis, or edema. Respiratory: Normal respiratory effort, no increased work of breathing. GI: Abdomen is soft, nontender, nondistended, no abdominal masses GU: No CVA tenderness.  Lymph: No cervical or inguinal lymphadenopathy. Skin: No rashes, bruises or suspicious lesions. Neurologic: Grossly intact, no focal deficits, moving all 4 extremities. Psychiatric: Normal mood and affect.  Laboratory  Data: No results found for: WBC, HGB, HCT, MCV, PLT  Lab Results  Component Value Date   CREATININE 1.79 (H) 09/09/2020    No results found for: PSA  No results found for: TESTOSTERONE  Lab Results  Component Value Date   HGBA1C 6.8 (H) 06/21/2019    Urinalysis    Component Value Date/Time   APPEARANCEUR Clear 07/16/2020 1324   GLUCOSEU Negative 07/16/2020 1324   BILIRUBINUR Negative 07/16/2020 1324   PROTEINUR 2+ (A) 07/16/2020 1324   NITRITE Negative 07/16/2020 1324   LEUKOCYTESUR Negative 07/16/2020 1324    Lab Results  Component Value Date   LABMICR See below: 07/16/2020   WBCUA None seen 07/16/2020   LABEPIT None seen 07/16/2020   MUCUS Present 07/16/2020   BACTERIA None seen 07/16/2020    Pertinent Imaging: CT abd/pelvis and bone scan: Images reviewed and discussed with the patient No results found for this or any previous visit.  No results found for this or any previous visit.  No results found for this or any previous visit.  No results found for this or any previous visit.  No results found for this or any previous visit.  No results found for this or any previous visit.  No results found for this or any previous visit.  No results found for this or any previous visit.   Assessment & Plan:    1. Prostate cancer (Thomas Duarte) -We will schedule for fiducial markers and SpoaceOAR. Risks/benefits/alternatives discussed -We will start ADT for 2 years   No follow-ups on file.  Thomas Bang, MD  [facility] [place]

## 2020-10-16 NOTE — Progress Notes (Signed)
Urological Symptom Review  Patient is experiencing the following symptoms: Hard to postpone urination Get up at night to urinate Erection problems (male only)   Review of Systems  Gastrointestinal (upper)  : Negative for upper GI symptoms  Gastrointestinal (lower) : Negative for lower GI symptoms  Constitutional : Negative for symptoms  Skin: Negative for skin symptoms  Eyes: Negative for eye symptoms  Ear/Nose/Throat : Negative for Ear/Nose/Throat symptoms  Hematologic/Lymphatic: Negative for Hematologic/Lymphatic symptoms  Cardiovascular : Negative for cardiovascular symptoms  Respiratory : Negative for respiratory symptoms  Endocrine: Negative for endocrine symptoms  Musculoskeletal: Negative for musculoskeletal symptoms  Neurological: Negative for neurological symptoms  Psychologic: Negative for psychiatric symptoms 

## 2020-10-17 ENCOUNTER — Other Ambulatory Visit: Payer: Self-pay

## 2020-10-17 ENCOUNTER — Ambulatory Visit (INDEPENDENT_AMBULATORY_CARE_PROVIDER_SITE_OTHER): Payer: Medicare HMO

## 2020-10-17 DIAGNOSIS — C61 Malignant neoplasm of prostate: Secondary | ICD-10-CM

## 2020-10-17 MED ORDER — DEGARELIX ACETATE(240 MG DOSE) 120 MG/VIAL ~~LOC~~ SOLR
240.0000 mg | Freq: Once | SUBCUTANEOUS | Status: AC
  Administered 2020-10-17: 240 mg via SUBCUTANEOUS

## 2020-10-17 MED ORDER — FLEET ENEMA 7-19 GM/118ML RE ENEM: 1.0000 | ENEMA | Freq: Once | RECTAL | 0 refills | Status: AC

## 2020-10-17 NOTE — Progress Notes (Signed)
Medical clearance sent to Dr Manuella Ghazi.

## 2020-10-17 NOTE — Progress Notes (Signed)
Firmagon Sub Q Injection  Due to Prostate Cancer patient is present today for a Firmagon Injection.   Medication: Mills Koller (Degarelix)  Dose: 240mg  Location: right & left upper abdomen Lot: [id number] Exp: 02/2022  Patient tolerated well, no complications were noted  Performed by: Hope, lpn   Follow up: keep next schedule OV

## 2020-10-17 NOTE — Patient Instructions (Signed)
Hormone Suppression Therapy for Prostate Cancer  Hormone suppression therapy is a treatment that can help to slow the growth of cancer cells in the prostate gland. It is also called androgen deprivation therapy (ADT) or androgen suppression therapy. Hormone suppression therapy targets hormones in the body that help cancer cells grow-these hormones are called androgens. Hormone suppression therapy alone will not cure prostate cancer, but it can slow the growth of cancer cells and may shrink tumors over time. Your health care provider can help you find the best treatment that fits your lifestyle. Hormone suppression therapy may be used in the following cases:  When prostate cancer has spread too far to other places in the body and cannot be cured by surgery or radiation.  When a person has health problems that prevent them from having surgery or radiation.  Before radiation to help shrink the size of the cancer and make the radiation treatment more effective.  If the prostate cancer remains or comes back following treatment with surgery or radiation. What are the types of hormone suppression therapy? Orchiectomy Orchiectomy, also called surgical castration, is a surgery to remove one or both testicles. The testicles make the two main androgens-testosterone and dihydrotestosterone. This surgery reduces the levels of testosterone in the blood, leading to decreased androgen production. Medicine therapy Medicine therapy, also called medical or chemical castration, involves taking medicines to keep your body from making or using androgens. Medicines can do this in one of three ways: 1. Reducing androgen production by the testicles: ? Luteinizing hormone-releasing hormone (LHRH) agonist medicines. These medicines are injected or implanted under your skin to lower the amount of androgens that your testicles make. If you take these medicines, you may also be prescribed other medicines to help with side  effects. ? LHRH antagonist medicines. These medicines also work to lower the amount of androgens made in the testicles but they work faster than LHRH agonist medicines and have less severe side effects. They are given as a monthly injection under the skin, and they are used when prostate cancer is in an advanced stage. ? Estrogens (male hormones). These medicines help reduce androgen production by the testicles. Usually, other types of hormone suppression therapy are used first based. If they do not work well or if they stop working, then estrogen may be given. 2. Blocking androgen attachment throughout the body. ? Anti-androgen medicines, also called androgen receptor antagonists, block areas on the body where androgens attach. These are pills that are usually used in combination with other types of hormone suppression therapy, like orchiectomy and other medicines. 3. Blocking androgen production throughout the body. ? Androgen synthesis inhibitor medicines. These medicines help to stop other areas of the body from making androgens. They are taken as pills. They may be used if the prostate cancer is advanced and has not gotten better with surgery or other medicines. A steroid medicine may be given with this type of medicine to help with side effects. What are the risks? Hormone suppression therapy may cause side effects, including:  Hot flashes.  Sexual side effects: ? Decrease or lack of sexual desire. ? Decrease in size of penis or testicles. ? Inability to get an erection (erectile dysfunction, or impotence). ? Breast tenderness or increase in breast size.  Fatigue.  Weight gain.  Thinning of the bones (osteoporosis) or loss of muscle mass.  Depression.  Increased cholesterol levels.  Trouble with thinking or focusing. Hormone suppression therapy may also increase your risk of high blood pressure (  hypertension), stroke, heart attack, or diabetes. What are the benefits? One of the  main benefits of hormone suppression therapy is having additional treatment options. You may have only one type of treatment or two or more types at the same time. Treatments may be combined to:  Help with side effects.  Treat advanced cancer. Contact a health care provider if:  You have pain or side effects that do not get better with treatment.  You have trouble urinating.  You have new side effects that do not go away. Get help right away if:  You have severe chest pain.  You have trouble breathing.  You have an irregular heartbeat.  You have numbness or paralysis in the lower half of your body.  You are confused.  You have trouble talking or understanding speech. These symptoms may be an emergency. Do not wait to see if the symptoms will go away. Get medical help right away. Call your local emergency services (911 in the [place] Do not drive yourself to the hospital. Summary  Hormone suppression therapy is a treatment that can help to slow the growth of cancer cells in the prostate (prostate gland).  Hormone suppression therapy alone will not cure prostate cancer, but it can slow the growth of prostate cancer and may shrink tumors over time.  Treatment to suppress hormones may include surgery or medicines. This information is not intended to replace advice given to you by your health care provider. Make sure you discuss any questions you have with your health care provider. Document Revised: 06/29/2019 Document Reviewed: 06/29/2019 Elsevier Patient Education  2021 [employer] Inc[employer]  

## 2020-10-18 ENCOUNTER — Other Ambulatory Visit (HOSPITAL_COMMUNITY): Payer: Self-pay | Admitting: Urology

## 2020-10-18 DIAGNOSIS — C61 Malignant neoplasm of prostate: Secondary | ICD-10-CM

## 2020-10-29 DIAGNOSIS — E1165 Type 2 diabetes mellitus with hyperglycemia: Secondary | ICD-10-CM | POA: Diagnosis not present

## 2020-10-29 DIAGNOSIS — Z6827 Body mass index (BMI) 27.0-27.9, adult: Secondary | ICD-10-CM | POA: Diagnosis not present

## 2020-10-29 DIAGNOSIS — Z299 Encounter for prophylactic measures, unspecified: Secondary | ICD-10-CM | POA: Diagnosis not present

## 2020-10-29 DIAGNOSIS — Z789 Other specified health status: Secondary | ICD-10-CM | POA: Diagnosis not present

## 2020-10-29 DIAGNOSIS — E1129 Type 2 diabetes mellitus with other diabetic kidney complication: Secondary | ICD-10-CM | POA: Diagnosis not present

## 2020-10-29 DIAGNOSIS — C61 Malignant neoplasm of prostate: Secondary | ICD-10-CM | POA: Diagnosis not present

## 2020-10-29 DIAGNOSIS — I10 Essential (primary) hypertension: Secondary | ICD-10-CM | POA: Diagnosis not present

## 2020-10-29 DIAGNOSIS — R030 Elevated blood-pressure reading, without diagnosis of hypertension: Secondary | ICD-10-CM | POA: Diagnosis not present

## 2020-10-30 ENCOUNTER — Telehealth: Payer: Self-pay

## 2020-10-30 NOTE — Progress Notes (Signed)
Letter mailed as reminder to telephone call.

## 2020-10-30 NOTE — Telephone Encounter (Signed)
Clearance received from Dr. Manuella Ghazi for approval for patient to hold plavix 5 days prior to his upcoming fiducial maker placement on May 2nd. Letter mailed and spoke with wife on the phone with instructions. Wife voiced understanding.

## 2020-11-01 NOTE — Addendum Note (Signed)
Encounter addended by: Annie Paras on: 11/01/2020 12:03 PM  Actions taken: Imaging Exam ended

## 2020-11-07 DIAGNOSIS — I5032 Chronic diastolic (congestive) heart failure: Secondary | ICD-10-CM | POA: Diagnosis not present

## 2020-11-07 DIAGNOSIS — I10 Essential (primary) hypertension: Secondary | ICD-10-CM | POA: Diagnosis not present

## 2020-11-07 DIAGNOSIS — E785 Hyperlipidemia, unspecified: Secondary | ICD-10-CM | POA: Diagnosis not present

## 2020-11-14 DIAGNOSIS — Z299 Encounter for prophylactic measures, unspecified: Secondary | ICD-10-CM | POA: Diagnosis not present

## 2020-11-14 DIAGNOSIS — D692 Other nonthrombocytopenic purpura: Secondary | ICD-10-CM | POA: Diagnosis not present

## 2020-11-14 DIAGNOSIS — N183 Chronic kidney disease, stage 3 unspecified: Secondary | ICD-10-CM | POA: Diagnosis not present

## 2020-11-14 DIAGNOSIS — I10 Essential (primary) hypertension: Secondary | ICD-10-CM | POA: Diagnosis not present

## 2020-11-14 DIAGNOSIS — I509 Heart failure, unspecified: Secondary | ICD-10-CM | POA: Diagnosis not present

## 2020-11-14 DIAGNOSIS — E1165 Type 2 diabetes mellitus with hyperglycemia: Secondary | ICD-10-CM | POA: Diagnosis not present

## 2020-11-14 DIAGNOSIS — Z789 Other specified health status: Secondary | ICD-10-CM | POA: Diagnosis not present

## 2020-11-14 DIAGNOSIS — E1122 Type 2 diabetes mellitus with diabetic chronic kidney disease: Secondary | ICD-10-CM | POA: Diagnosis not present

## 2020-11-22 ENCOUNTER — Ambulatory Visit (INDEPENDENT_AMBULATORY_CARE_PROVIDER_SITE_OTHER): Payer: Medicare HMO | Admitting: Urology

## 2020-11-22 ENCOUNTER — Other Ambulatory Visit: Payer: Self-pay

## 2020-11-22 ENCOUNTER — Encounter: Payer: Self-pay | Admitting: Urology

## 2020-11-22 VITALS — BP 122/72 | HR 80 | Temp 98.2°F | Ht 74.0 in | Wt 215.0 lb

## 2020-11-22 DIAGNOSIS — R972 Elevated prostate specific antigen [PSA]: Secondary | ICD-10-CM | POA: Diagnosis not present

## 2020-11-22 DIAGNOSIS — C61 Malignant neoplasm of prostate: Secondary | ICD-10-CM

## 2020-11-22 LAB — MICROSCOPIC EXAMINATION
Epithelial Cells (non renal): NONE SEEN /hpf (ref 0–10)
RBC, Urine: NONE SEEN /hpf (ref 0–2)
Renal Epithel, UA: NONE SEEN /hpf

## 2020-11-22 LAB — URINALYSIS, ROUTINE W REFLEX MICROSCOPIC
Bilirubin, UA: NEGATIVE
Glucose, UA: NEGATIVE
Leukocytes,UA: NEGATIVE
Nitrite, UA: NEGATIVE
RBC, UA: NEGATIVE
Specific Gravity, UA: 1.015 (ref 1.005–1.030)
Urobilinogen, Ur: 0.2 mg/dL (ref 0.2–1.0)
pH, UA: 5 (ref 5.0–7.5)

## 2020-11-22 MED ORDER — DEGARELIX ACETATE 80 MG ~~LOC~~ SOLR
80.0000 mg | Freq: Once | SUBCUTANEOUS | Status: AC
  Administered 2020-11-22: 80 mg via SUBCUTANEOUS

## 2020-11-22 NOTE — Patient Instructions (Signed)
Prostate Cancer  The prostate is a male gland that helps make semen. It is located below a man's bladder, in front of the rectum. Prostate cancer is when abnormal cells grow in this gland. What are the causes? The cause of this condition is not known. What increases the risk? You are more likely to develop this condition if:  You are 81 years of age or older.  You are African American.  You have a family history of prostate cancer.  You have a family history of breast cancer. What are the signs or symptoms? Symptoms of this condition include:  A need to pee often.  Peeing that is weak, or pee that stops and starts.  Trouble starting or stopping your pee.  Inability to pee.  Blood in your pee or semen.  Pain in the lower back, lower belly (abdomen), hips, or upper thighs.  Trouble getting an erection.  Trouble emptying all of your pee. How is this treated? Treatment for this condition depends on your age, your health, the kind of treatment you like, and how far the cancer has spread. Treatments include:  Being watched. This is called observation. You will be tested from time to time, but you will not get treated. Tests are to make sure that the cancer is not growing.  Surgery. This may be done to remove the prostate, to remove the testicles, or to freeze or kill cancer cells.  Radiation. This uses a strong beam to kill cancer cells.  Ultrasound energy. This uses strong sound waves to kill cancer cells.  Chemotherapy. This uses medicines that stop cancer cells from increasing. This kills cancer cells and healthy cells.  Targeted therapy. This kills cancer cells only. Healthy cells are not affected.  Hormone treatment. This stops the body from making hormones that help the cancer cells to grow. Follow these instructions at home:  Take over-the-counter and prescription medicines only as told by your doctor.  Eat a healthy diet.  Get plenty of sleep.  Ask your  doctor for help to find a support group for men with prostate cancer.  If you have to go to the hospital, let your cancer doctor (oncologist) know.  Treatment may affect your ability to have sex. Touch, hold, hug, and caress your partner to have intimate moments.  Keep all follow-up visits as told by your doctor. This is important. Contact a doctor if:  You have new or more trouble peeing.  You have new or more blood in your pee.  You have new or more pain in your hips, back, or chest. Get help right away if:  You have weakness in your legs.  You lose feeling in your legs.  You cannot control your pee or your poop (stool).  You have chills or a fever. Summary  The prostate is a male gland that helps make semen.  Prostate cancer is when abnormal cells grow in this gland.  Treatment includes doing surgery, using medicines, using very strong beams, or watching without treatment.  Ask your doctor for help to find a support group for men with prostate cancer.  Contact a doctor if you have problems peeing or have any new pain that you did not have before. This information is not intended to replace advice given to you by your health care provider. Make sure you discuss any questions you have with your health care provider. Document Revised: 07/18/2019 Document Reviewed: 07/18/2019 Elsevier Patient Education  2021 Elsevier Inc.  

## 2020-11-22 NOTE — H&P (View-Only) (Signed)
11/22/2020 1:23 PM   Thomas Duarte [birth year] [id number]  Referring provider: Monico Blitz, MD [address] [place],   [address]  followup prostate cancer  HPI: [name] is a 80yo here for followup for prostate cancer. He did not get a PSA or testosterone. He was given firmagon 240mg  last visit. Mild hot flashes. He is scheduled for fiducial markers May 2nd.    PMH: Past Medical History:  Diagnosis Date  . BPH (benign prostatic hyperplasia)   . Diabetes mellitus without complication ([place])   . Hypercholesteremia   . Hypertension   . Stroke ([place])    x2; numbness of right thumb and pointer and middle finger, no other deficits    Surgical History: Past Surgical History:  Procedure Laterality Date  . CATARACT EXTRACTION W/PHACO Left 10/21/2018   Procedure: CATARACT EXTRACTION PHACO AND INTRAOCULAR LENS PLACEMENT LEFT EYE;  Surgeon: Thomas Goldmann, MD;  Location: [facility];  Service: Ophthalmology;  Laterality: Left;  CDE: 5.18  . CATARACT EXTRACTION W/PHACO Right 06/23/2019   Procedure: CATARACT EXTRACTION PHACO AND INTRAOCULAR LENS PLACEMENT (IOC);  Surgeon: Thomas Goldmann, MD;  Location: [facility];  Service: Ophthalmology;  Laterality: Right;  CDE: 15.0  . EXPLORATORY LAPAROTOMY     removal of scar tissue.  Marland Kitchen HERNIA REPAIR Left 1960   Inguinal  . IR GENERIC HISTORICAL  04/28/2016   IR RADIOLOGIST EVAL & MGMT 04/28/2016 [facility]  . TONSILLECTOMY  1952   adenoidectomy    Home Medications:  Allergies as of 11/22/2020   No Known Allergies     Medication List       Accurate as of November 22, 2020  1:23 PM. If you have any questions, ask your nurse or doctor.        aspirin 325 MG tablet Take 325 mg by mouth daily. What changed: Another medication with the same name was removed. Continue taking this medication, and follow the directions you see here. Changed by: Thomas Bang, MD   atorvastatin 40 MG tablet Commonly known as: LIPITOR Take 40 mg by mouth at  bedtime.   clopidogrel 75 MG tablet Commonly known as: PLAVIX Take 75 mg by mouth daily.   furosemide 40 MG tablet Commonly known as: LASIX Take 40 mg by mouth 2 (two) times daily.   Glycerin-Hypromellose-PEG 400 0.2-0.2-1 % Soln Place 1 drop into both eyes 3 (three) times daily as needed (dry eyes).   Klor-Con M10 10 MEQ tablet Generic drug: potassium chloride Take by mouth.   lisinopril 40 MG tablet Commonly known as: ZESTRIL Take 40 mg by mouth daily.   metFORMIN 500 MG tablet Commonly known as: GLUCOPHAGE Take 500 mg by mouth 2 (two) times daily.   metoprolol succinate 25 MG 24 hr tablet Commonly known as: TOPROL-XL Take 25 mg by mouth daily.   OneTouch Delica Plus [id number] Misc SMARTSIG:1 Strip(s) Topical Daily   OneTouch Verio test strip Generic drug: glucose blood 1 each daily.   tamsulosin 0.4 MG Caps capsule Commonly known as: FLOMAX Take 0.8 mg by mouth at bedtime.   tobramycin 0.3 % ophthalmic solution Commonly known as: TOBREX SMARTSIG:In Eye(s)       Allergies: No Known Allergies  Family History: No family history on file.  Social History:  reports that he has never smoked. He has never used smokeless tobacco. He reports that he does not drink alcohol and does not use drugs.  ROS: All other review of systems were reviewed and are negative except what is noted  above in HPI  Physical Exam: BP 122/72   Pulse 80   Temp 98.2 F (36.8 C)   Ht 6\' 2"  (1.88 m)   Wt 215 lb (97.5 kg)   BMI 27.60 kg/m   Constitutional:  Alert and oriented, No acute distress. HEENT: [place] AT, moist mucus membranes.  Trachea midline, no masses. Cardiovascular: No clubbing, cyanosis, or edema. Respiratory: Normal respiratory effort, no increased work of breathing. GI: Abdomen is soft, nontender, nondistended, no abdominal masses GU: No CVA tenderness.  Lymph: No cervical or inguinal lymphadenopathy. Skin: No rashes, bruises or suspicious lesions. Neurologic:  Grossly intact, no focal deficits, moving all 4 extremities. Psychiatric: Normal mood and affect.  Laboratory Data: No results found for: WBC, HGB, HCT, MCV, PLT  Lab Results  Component Value Date   CREATININE 1.79 (H) 09/09/2020    No results found for: PSA  No results found for: TESTOSTERONE  Lab Results  Component Value Date   HGBA1C 6.8 (H) 06/21/2019    Urinalysis    Component Value Date/Time   APPEARANCEUR Clear 10/16/2020 1735   GLUCOSEU Negative 10/16/2020 1735   BILIRUBINUR Negative 10/16/2020 1735   PROTEINUR 2+ (A) 10/16/2020 1735   NITRITE Negative 10/16/2020 1735   LEUKOCYTESUR Negative 10/16/2020 1735    Lab Results  Component Value Date   LABMICR Comment 10/16/2020   WBCUA None seen 07/16/2020   LABEPIT None seen 07/16/2020   MUCUS Present 07/16/2020   BACTERIA None seen 07/16/2020    Pertinent Imaging:  No results found for this or any previous visit.  No results found for this or any previous visit.  No results found for this or any previous visit.  No results found for this or any previous visit.  No results found for this or any previous visit.  No results found for this or any previous visit.  No results found for this or any previous visit.  No results found for this or any previous visit.   Assessment & Plan:    1. Prostate cancer ([place]) - degarelix (FIRMAGON) injection 80 mg today -PSA and testosterone today -RTC 1 month for eligard 45mg  -Patient scheduled for fiducial markers and [employer] May 2nd, 2022   No follow-ups on file.  Thomas Bang, MD  [facility] [place]

## 2020-11-22 NOTE — Progress Notes (Signed)
Urological Symptom Review  Patient is experiencing the following symptoms: Leakage of urine Stream starts and stops Erection problems (male only)   Review of Systems  Gastrointestinal (upper)  : Negative for upper GI symptoms  Gastrointestinal (lower) : Negative for lower GI symptoms  Constitutional : Fatigue  Skin: Negative for skin symptoms  Eyes: Negative for eye symptoms  Ear/Nose/Throat : Negative for Ear/Nose/Throat symptoms  Hematologic/Lymphatic: Easy bruising  Cardiovascular : Negative for cardiovascular symptoms  Respiratory : Shortness of breath  Endocrine: Negative for endocrine symptoms  Musculoskeletal: Negative for musculoskeletal symptoms  Neurological: Negative for neurological symptoms  Psychologic: Negative for psychiatric symptoms

## 2020-11-22 NOTE — Progress Notes (Signed)
11/22/2020 1:23 PM   Thomas Duarte 09-10-1940 [id number]  Referring provider: Monico Blitz, MD [address] [place],  [place] [address]  followup prostate cancer  HPI: Mr Thomas Duarte is a 80yo here for followup for prostate cancer. He did not get a PSA or testosterone. He was given firmagon 240mg  last visit. Mild hot flashes. He is scheduled for fiducial markers May 2nd.    PMH: Past Medical History:  Diagnosis Date  . BPH (benign prostatic hyperplasia)   . Diabetes mellitus without complication ([place])   . Hypercholesteremia   . Hypertension   . Stroke ([place])    x2; numbness of right thumb and pointer and middle finger, no other deficits    Surgical History: Past Surgical History:  Procedure Laterality Date  . CATARACT EXTRACTION W/PHACO Left 10/21/2018   Procedure: CATARACT EXTRACTION PHACO AND INTRAOCULAR LENS PLACEMENT LEFT EYE;  Surgeon: Baruch Goldmann, MD;  Location: [facility];  Service: Ophthalmology;  Laterality: Left;  CDE: 5.18  . CATARACT EXTRACTION W/PHACO Right 06/23/2019   Procedure: CATARACT EXTRACTION PHACO AND INTRAOCULAR LENS PLACEMENT (IOC);  Surgeon: Baruch Goldmann, MD;  Location: [facility];  Service: Ophthalmology;  Laterality: Right;  CDE: 15.0  . EXPLORATORY LAPAROTOMY     removal of scar tissue.  Marland Kitchen HERNIA REPAIR Left 1960   Inguinal  . IR GENERIC HISTORICAL  04/28/2016   IR RADIOLOGIST EVAL & MGMT 04/28/2016 [facility]  . TONSILLECTOMY  1952   adenoidectomy    Home Medications:  Allergies as of 11/22/2020   No Known Allergies     Medication List       Accurate as of November 22, 2020  1:23 PM. If you have any questions, ask your nurse or doctor.        aspirin 325 MG tablet Take 325 mg by mouth daily. What changed: Another medication with the same name was removed. Continue taking this medication, and follow the directions you see here. Changed by: Nicolette Bang, MD   atorvastatin 40 MG tablet Commonly known as: LIPITOR Take 40 mg by mouth at  bedtime.   clopidogrel 75 MG tablet Commonly known as: PLAVIX Take 75 mg by mouth daily.   furosemide 40 MG tablet Commonly known as: LASIX Take 40 mg by mouth 2 (two) times daily.   Glycerin-Hypromellose-PEG 400 0.2-0.2-1 % Soln Place 1 drop into both eyes 3 (three) times daily as needed (dry eyes).   Klor-Con M10 10 MEQ tablet Generic drug: potassium chloride Take by mouth.   lisinopril 40 MG tablet Commonly known as: ZESTRIL Take 40 mg by mouth daily.   metFORMIN 500 MG tablet Commonly known as: GLUCOPHAGE Take 500 mg by mouth 2 (two) times daily.   metoprolol succinate 25 MG 24 hr tablet Commonly known as: TOPROL-XL Take 25 mg by mouth daily.   OneTouch Delica Plus [id number] Misc SMARTSIG:1 Strip(s) Topical Daily   OneTouch Verio test strip Generic drug: glucose blood 1 each daily.   tamsulosin 0.4 MG Caps capsule Commonly known as: FLOMAX Take 0.8 mg by mouth at bedtime.   tobramycin 0.3 % ophthalmic solution Commonly known as: TOBREX SMARTSIG:In Eye(s)       Allergies: No Known Allergies  Family History: No family history on file.  Social History:  reports that he has never smoked. He has never used smokeless tobacco. He reports that he does not drink alcohol and does not use drugs.  ROS: All other review of systems were reviewed and are negative except what is noted  above in HPI  Physical Exam: BP 122/72   Pulse 80   Temp 98.2 F (36.8 C)   Ht 6\' 2"  (1.88 m)   Wt 215 lb (97.5 kg)   BMI 27.60 kg/m   Constitutional:  Alert and oriented, No acute distress. HEENT: [place] AT, moist mucus membranes.  Trachea midline, no masses. Cardiovascular: No clubbing, cyanosis, or edema. Respiratory: Normal respiratory effort, no increased work of breathing. GI: Abdomen is soft, nontender, nondistended, no abdominal masses GU: No CVA tenderness.  Lymph: No cervical or inguinal lymphadenopathy. Skin: No rashes, bruises or suspicious lesions. Neurologic:  Grossly intact, no focal deficits, moving all 4 extremities. Psychiatric: Normal mood and affect.  Laboratory Data: No results found for: WBC, HGB, HCT, MCV, PLT  Lab Results  Component Value Date   CREATININE 1.79 (H) 09/09/2020    No results found for: PSA  No results found for: TESTOSTERONE  Lab Results  Component Value Date   HGBA1C 6.8 (H) 06/21/2019    Urinalysis    Component Value Date/Time   APPEARANCEUR Clear 10/16/2020 1735   GLUCOSEU Negative 10/16/2020 1735   BILIRUBINUR Negative 10/16/2020 1735   PROTEINUR 2+ (A) 10/16/2020 1735   NITRITE Negative 10/16/2020 1735   LEUKOCYTESUR Negative 10/16/2020 1735    Lab Results  Component Value Date   LABMICR Comment 10/16/2020   WBCUA None seen 07/16/2020   LABEPIT None seen 07/16/2020   MUCUS Present 07/16/2020   BACTERIA None seen 07/16/2020    Pertinent Imaging:  No results found for this or any previous visit.  No results found for this or any previous visit.  No results found for this or any previous visit.  No results found for this or any previous visit.  No results found for this or any previous visit.  No results found for this or any previous visit.  No results found for this or any previous visit.  No results found for this or any previous visit.   Assessment & Plan:    1. Prostate cancer ([place]) - degarelix (FIRMAGON) injection 80 mg today -PSA and testosterone today -RTC 1 month for eligard 45mg  -Patient scheduled for fiducial markers and [employer] May 2nd, 2022   No follow-ups on file.  Nicolette Bang, MD  [facility] [place]

## 2020-11-23 LAB — TESTOSTERONE: Testosterone: 9 ng/dL — ABNORMAL LOW (ref 264–916)

## 2020-11-23 LAB — PSA: Prostate Specific Ag, Serum: 3.1 ng/mL (ref 0.0–4.0)

## 2020-12-11 DIAGNOSIS — H35372 Puckering of macula, left eye: Secondary | ICD-10-CM | POA: Diagnosis not present

## 2020-12-11 DIAGNOSIS — H35033 Hypertensive retinopathy, bilateral: Secondary | ICD-10-CM | POA: Diagnosis not present

## 2020-12-11 DIAGNOSIS — E113313 Type 2 diabetes mellitus with moderate nonproliferative diabetic retinopathy with macular edema, bilateral: Secondary | ICD-10-CM | POA: Diagnosis not present

## 2020-12-11 DIAGNOSIS — H43813 Vitreous degeneration, bilateral: Secondary | ICD-10-CM | POA: Diagnosis not present

## 2020-12-11 NOTE — Patient Instructions (Signed)
Thomas Duarte  12/11/2020     @PREFPERIOPPHARMACY @   Your procedure is scheduled on  12/16/2020   Report to [facility] at  1000  A.M.   Call this number if you have problems the morning of surgery:  [phone]   Remember:  Do not eat or drink after midnight.                        Take these medicines the morning of surgery with A SIP OF WATER Metoprolol   DO NOT take any medications for diabetes the morning of your procedure.   If your glucose is 70 or below the morning of your procedure, drink 1/2 cup of clear liquid containing sugar and recheck your glucose in 15 minutes. If your glucose is still 70 or below, call [phone] for instructions.  If your glucose is 300 or above the morning of your procedure, call [phone] for instructions.    Do not wear jewelry, make-up or nail polish.  Do not wear lotions, powders, or perfumes, or deodorant.  Do not shave 48 hours prior to surgery.  Men may shave face and neck.  Do not bring valuables to the hospital.  [facility] is not responsible for any belongings or valuables.   Contacts, dentures or bridgework may not be worn into surgery.  Leave your suitcase in the car.  After surgery it may be brought to your room.  For patients admitted to the hospital, discharge time will be determined by your treatment team.  Patients discharged the day of surgery will not be allowed to drive home and must have someone with them for 24 hours.     Place clean sheets on your bed the night before your procedure and DO NOT sleep with pets this night.  Shower with CHG the night before and the morning of your procedure. DO NOT out CHG on your face, hair or genitals.  After each shower, dry off with a clean towel, put on clean, comfortable clothes and brush your teeth.     Special instructions:  DO NOT smoke tobacco or vape for 24 hours before your procedure.   Please read over the following fact sheets that you were  given. Coughing and Deep Breathing, Surgical Site Infection Prevention, Anesthesia Post-op Instructions and Care and Recovery After Surgery       Transrectal Ultrasound-Guided Prostate Gold Seed Placement, Care After This sheet gives you information about how to care for yourself after your procedure. Your health care provider may also give you more specific instructions. If you have problems or questions, contact your health care provider. What can I expect after the procedure? After the procedure, it is common to have:  Light bleeding from the rectum.  Bruising or tenderness in the area behind the scrotum (perineum).  Small amounts of blood in your urine. This should only last for a couple of days.  Light brown or red semen. This may last for a couple of weeks. Follow these instructions at home: Medicines  Take over-the-counter and prescription medicines only as told by your health care provider.  If you were prescribed an antibiotic medicine, take it as told by your health care provider. Do not stop taking the antibiotic even if you start to feel better.   Activity  Do not drive for 24 hours if you were given a medicine to help you relax (sedative) during your procedure.  Do not drive  or use heavy machinery while taking prescription pain medicine.  Return to your normal activities as told by your health care provider. Ask your health care provider what activities are safe for you.  Ask your health care provider when it is safe for you to resume sexual activity. General instructions  Do not take baths, swim, or use a hot tub until your health care provider approves.  Drink enough water to keep your urine pale yellow.  Plan to have a responsible adult care for you for at least 24 hours after you leave the hospital or clinic. This is important.  Keep all follow-up visits as told by your health care provider. This is important. Managing pain, stiffness, and swelling  If  directed, put ice on the affected area: ? Put ice in a plastic bag. ? Place a towel between your skin and the bag. ? Leave the ice on for 20 minutes, 2-3 times a day.  Try not to sit directly on the area behind the scrotum. A soft cushion can help with discomfort. Contact a health care provider if:  You have a fever or chills.  You have more blood in your urine instead of less.  You have blood in your urine for more than 2-3 days after the procedure.  You have trouble urinating or having a bowel movement.  You have pain or burning when urinating.  You have nausea or you vomit. Get help right away if:  You have severe pain that does not get better with medicine.  Your urine is bright red.  You cannot urinate.  You have rectal bleeding that gets worse.  You have shortness of breath. Summary  After the procedure, you may have blood in your urine and light bleeding from the rectum.  Return to your normal activities as told by your health care provider. Ask your health care provider what activities are safe for you.  Take over-the-counter and prescription medicines only as told by your health care provider.  Contact your health care provider right away if your urine is bright red or you cannot urinate. This information is not intended to replace advice given to you by your health care provider. Make sure you discuss any questions you have with your health care provider. Document Revised: 04/19/2020 Document Reviewed: 04/19/2020 Elsevier Patient Education  2021 [place] Anesthesia, Adult, Care After This sheet gives you information about how to care for yourself after your procedure. Your health care provider may also give you more specific instructions. If you have problems or questions, contact your health care provider. What can I expect after the procedure? After the procedure, the following side effects are common:  Pain or discomfort at the IV  site.  Nausea.  Vomiting.  Sore throat.  Trouble concentrating.  Feeling cold or chills.  Feeling weak or tired.  Sleepiness and fatigue.  Soreness and body aches. These side effects can affect parts of the body that were not involved in surgery. Follow these instructions at home: For the time period you were told by your health care provider:  Rest.  Do not participate in activities where you could fall or become injured.  Do not drive or use machinery.  Do not drink alcohol.  Do not take sleeping pills or medicines that cause drowsiness.  Do not make important decisions or sign legal documents.  Do not take care of children on your own.   Eating and drinking  Follow any instructions from your health care  provider about eating or drinking restrictions.  When you feel hungry, start by eating small amounts of foods that are soft and easy to digest (bland), such as toast. Gradually return to your regular diet.  Drink enough fluid to keep your urine pale yellow.  If you vomit, rehydrate by drinking water, juice, or clear broth. General instructions  If you have sleep apnea, surgery and certain medicines can increase your risk for breathing problems. Follow instructions from your health care provider about wearing your sleep device: ? Anytime you are sleeping, including during daytime naps. ? While taking prescription pain medicines, sleeping medicines, or medicines that make you drowsy.  Have a responsible adult stay with you for the time you are told. It is important to have someone help care for you until you are awake and alert.  Return to your normal activities as told by your health care provider. Ask your health care provider what activities are safe for you.  Take over-the-counter and prescription medicines only as told by your health care provider.  If you smoke, do not smoke without supervision.  Keep all follow-up visits as told by your health care  provider. This is important. Contact a health care provider if:  You have nausea or vomiting that does not get better with medicine.  You cannot eat or drink without vomiting.  You have pain that does not get better with medicine.  You are unable to pass urine.  You develop a skin rash.  You have a fever.  You have redness around your IV site that gets worse. Get help right away if:  You have difficulty breathing.  You have chest pain.  You have blood in your urine or stool, or you vomit blood. Summary  After the procedure, it is common to have a sore throat or nausea. It is also common to feel tired.  Have a responsible adult stay with you for the time you are told. It is important to have someone help care for you until you are awake and alert.  When you feel hungry, start by eating small amounts of foods that are soft and easy to digest (bland), such as toast. Gradually return to your regular diet.  Drink enough fluid to keep your urine pale yellow.  Return to your normal activities as told by your health care provider. Ask your health care provider what activities are safe for you. This information is not intended to replace advice given to you by your health care provider. Make sure you discuss any questions you have with your health care provider. Document Revised: 04/18/2020 Document Reviewed: 11/16/2019 Elsevier Patient Education  2021 [employer].

## 2020-12-13 ENCOUNTER — Other Ambulatory Visit: Payer: Self-pay

## 2020-12-13 ENCOUNTER — Encounter (HOSPITAL_COMMUNITY)
Admission: RE | Admit: 2020-12-13 | Discharge: 2020-12-13 | Disposition: A | Discharge status: Home / Self Care | Payer: Medicare HMO | Source: Ambulatory Visit | Attending: Urology | Admitting: Urology

## 2020-12-13 ENCOUNTER — Other Ambulatory Visit (HOSPITAL_COMMUNITY)
Admission: RE | Admit: 2020-12-13 | Discharge: 2020-12-13 | Disposition: A | Discharge status: Home / Self Care | Payer: Medicare HMO | Source: Ambulatory Visit | Attending: Urology | Admitting: Urology

## 2020-12-13 ENCOUNTER — Encounter (HOSPITAL_COMMUNITY): Payer: Self-pay

## 2020-12-13 DIAGNOSIS — Z20822 Contact with and (suspected) exposure to covid-19: Secondary | ICD-10-CM | POA: Insufficient documentation

## 2020-12-13 DIAGNOSIS — Z01818 Encounter for other preprocedural examination: Secondary | ICD-10-CM | POA: Insufficient documentation

## 2020-12-13 LAB — BASIC METABOLIC PANEL
Anion gap: 8 (ref 5–15)
BUN: 47 mg/dL — ABNORMAL HIGH (ref 8–23)
CO2: 24 mmol/L (ref 22–32)
Calcium: 9 mg/dL (ref 8.9–10.3)
Chloride: 106 mmol/L (ref 98–111)
Creatinine, Ser: 2.07 mg/dL — ABNORMAL HIGH (ref 0.61–1.24)
GFR, Estimated: 32 mL/min — ABNORMAL LOW (ref >60)
Glucose, Bld: 268 mg/dL — ABNORMAL HIGH (ref 70–99)
Potassium: 4.5 mmol/L (ref 3.5–5.1)
Sodium: 138 mmol/L (ref 135–145)

## 2020-12-13 LAB — HEMOGLOBIN A1C
Hgb A1c MFr Bld: 7 % — ABNORMAL HIGH (ref 4.8–5.6)
Mean Plasma Glucose: 154.2 mg/dL

## 2020-12-14 DIAGNOSIS — I10 Essential (primary) hypertension: Secondary | ICD-10-CM | POA: Diagnosis not present

## 2020-12-14 DIAGNOSIS — R002 Palpitations: Secondary | ICD-10-CM | POA: Diagnosis not present

## 2020-12-14 LAB — SARS CORONAVIRUS 2 (TAT 6-24 HRS): SARS Coronavirus 2: NEGATIVE

## 2020-12-16 ENCOUNTER — Ambulatory Visit (HOSPITAL_COMMUNITY): Payer: Medicare HMO | Admitting: Anesthesiology

## 2020-12-16 ENCOUNTER — Other Ambulatory Visit: Payer: Self-pay

## 2020-12-16 ENCOUNTER — Encounter (HOSPITAL_COMMUNITY): Payer: Self-pay | Admitting: Urology

## 2020-12-16 ENCOUNTER — Ambulatory Visit (HOSPITAL_COMMUNITY)
Admission: RE | Admit: 2020-12-16 | Discharge: 2020-12-16 | Disposition: A | Discharge status: Home / Self Care | Payer: Medicare HMO | Attending: Urology | Admitting: Urology

## 2020-12-16 ENCOUNTER — Ambulatory Visit (HOSPITAL_COMMUNITY)
Admission: RE | Admit: 2020-12-16 | Discharge: 2020-12-16 | Disposition: A | Discharge status: Home / Self Care | Payer: Medicare HMO | Source: Ambulatory Visit | Attending: Urology | Admitting: Urology

## 2020-12-16 ENCOUNTER — Encounter (HOSPITAL_COMMUNITY)
Admission: RE | Disposition: A | Discharge status: Home / Self Care | Payer: Self-pay | Source: Home / Self Care | Attending: Urology

## 2020-12-16 DIAGNOSIS — Z79899 Other long term (current) drug therapy: Secondary | ICD-10-CM | POA: Diagnosis not present

## 2020-12-16 DIAGNOSIS — C61 Malignant neoplasm of prostate: Secondary | ICD-10-CM | POA: Insufficient documentation

## 2020-12-16 DIAGNOSIS — Z7902 Long term (current) use of antithrombotics/antiplatelets: Secondary | ICD-10-CM | POA: Diagnosis not present

## 2020-12-16 DIAGNOSIS — Z7984 Long term (current) use of oral hypoglycemic drugs: Secondary | ICD-10-CM | POA: Diagnosis not present

## 2020-12-16 DIAGNOSIS — I6930 Unspecified sequelae of cerebral infarction: Secondary | ICD-10-CM | POA: Diagnosis not present

## 2020-12-16 DIAGNOSIS — Z7982 Long term (current) use of aspirin: Secondary | ICD-10-CM | POA: Insufficient documentation

## 2020-12-16 HISTORY — PX: GOLD SEED IMPLANT: SHX6343

## 2020-12-16 HISTORY — PX: SPACE OAR INSTILLATION: SHX6769

## 2020-12-16 LAB — GLUCOSE, CAPILLARY
Glucose-Capillary: 135 mg/dL — ABNORMAL HIGH (ref 70–99)
Glucose-Capillary: 113 mg/dL — ABNORMAL HIGH (ref 70–99)

## 2020-12-16 SURGERY — INSERTION, GOLD SEEDS
Anesthesia: General | Site: Prostate

## 2020-12-16 MED ORDER — FENTANYL CITRATE (PF) 250 MCG/5ML IJ SOLN
INTRAMUSCULAR | Status: DC | PRN
  Administered 2020-12-16: 25 ug via INTRAVENOUS

## 2020-12-16 MED ORDER — CEFAZOLIN SODIUM-DEXTROSE 2-4 GM/100ML-% IV SOLN
2.0000 g | INTRAVENOUS | Status: AC
  Administered 2020-12-16: 2 g via INTRAVENOUS
  Filled 2020-12-16: qty 100

## 2020-12-16 MED ORDER — CHLORHEXIDINE GLUCONATE 0.12 % MT SOLN
15.0000 mL | Freq: Once | OROMUCOSAL | Status: AC
  Administered 2020-12-16: 15 mL via OROMUCOSAL
  Filled 2020-12-16: qty 15

## 2020-12-16 MED ORDER — FENTANYL CITRATE (PF) 100 MCG/2ML IJ SOLN
25.0000 ug | INTRAMUSCULAR | Status: DC | PRN
Start: 2020-12-16 — End: 2020-12-16

## 2020-12-16 MED ORDER — FENTANYL CITRATE (PF) 100 MCG/2ML IJ SOLN
INTRAMUSCULAR | Status: AC
  Filled 2020-12-16: qty 2

## 2020-12-16 MED ORDER — STERILE WATER FOR IRRIGATION IR SOLN
Status: DC | PRN
  Administered 2020-12-16: 500 mL

## 2020-12-16 MED ORDER — LACTATED RINGERS IV SOLN
INTRAVENOUS | Status: DC
  Administered 2020-12-16: 1000 mL via INTRAVENOUS

## 2020-12-16 MED ORDER — DEXAMETHASONE SODIUM PHOSPHATE 10 MG/ML IJ SOLN
INTRAMUSCULAR | Status: AC
  Filled 2020-12-16: qty 2

## 2020-12-16 MED ORDER — ONDANSETRON HCL 4 MG/2ML IJ SOLN: 4.0000 mg | Freq: Once | INTRAMUSCULAR | Status: DC | PRN

## 2020-12-16 MED ORDER — ONDANSETRON HCL 4 MG/2ML IJ SOLN
INTRAMUSCULAR | Status: AC
  Filled 2020-12-16: qty 2

## 2020-12-16 MED ORDER — ONDANSETRON HCL 4 MG/2ML IJ SOLN
INTRAMUSCULAR | Status: DC | PRN
  Administered 2020-12-16: 4 mg via INTRAVENOUS

## 2020-12-16 MED ORDER — ORAL CARE MOUTH RINSE: 15.0000 mL | Freq: Once | OROMUCOSAL | Status: AC

## 2020-12-16 MED ORDER — DEXAMETHASONE SODIUM PHOSPHATE 10 MG/ML IJ SOLN
INTRAMUSCULAR | Status: DC | PRN
  Administered 2020-12-16: 10 mg via INTRAVENOUS

## 2020-12-16 MED ORDER — PROPOFOL 10 MG/ML IV BOLUS
INTRAVENOUS | Status: DC | PRN
  Administered 2020-12-16: 150 mg via INTRAVENOUS

## 2020-12-16 MED ORDER — SODIUM CHLORIDE (PF) 0.9 % IJ SOLN
INTRAMUSCULAR | Status: AC
  Filled 2020-12-16: qty 20

## 2020-12-16 MED ORDER — LIDOCAINE 2% (20 MG/ML) 5 ML SYRINGE
INTRAMUSCULAR | Status: DC | PRN
  Administered 2020-12-16: 60 mg via INTRAVENOUS

## 2020-12-16 MED ORDER — SODIUM CHLORIDE FLUSH 0.9 % IV SOLN
INTRAVENOUS | Status: DC | PRN
  Administered 2020-12-16: 5 mL

## 2020-12-16 SURGICAL SUPPLY — 18 items
DRAPE C-ARM 35X43 STRL (DRAPES) ×2 IMPLANT
DRSG TEGADERM 8X12 (GAUZE/BANDAGES/DRESSINGS) ×2 IMPLANT
GLOVE BIO SURGEON STRL SZ8 (GLOVE) ×2 IMPLANT
GLOVE ECLIPSE 8.0 STRL XLNG CF (GLOVE) ×2 IMPLANT
GLOVE SRG 8 PF TXTR STRL LF DI (GLOVE) ×1 IMPLANT
GLOVE SURG UNDER POLY LF SZ7 (GLOVE) ×2 IMPLANT
GLOVE SURG UNDER POLY LF SZ8 (GLOVE) ×2
GOWN STRL REUS W/TWL LRG LVL3 (GOWN DISPOSABLE) ×2 IMPLANT
GOWN STRL REUS W/TWL XL LVL3 (GOWN DISPOSABLE) ×2 IMPLANT
IMPL SPACEOAR SYSTEM 10ML (Spacer) ×1 IMPLANT
IMPLANT SPACEOAR SYSTEM 10ML (Spacer) ×2 IMPLANT
KIT TURNOVER CYSTO (KITS) ×2 IMPLANT
MARKER GOLD PRELOAD 1.2X3 (Urological Implant) ×1 IMPLANT
PACK CYSTO (CUSTOM PROCEDURE TRAY) ×2 IMPLANT
SEED GOLD PRELOAD 1.2X3 (Urological Implant) ×6 IMPLANT
SURGILUBE 2OZ TUBE FLIPTOP (MISCELLANEOUS) ×2 IMPLANT
UNDERPAD 30X36 HEAVY ABSORB (UNDERPADS AND DIAPERS) ×4 IMPLANT
WATER STERILE IRR 500ML POUR (IV SOLUTION) ×1 IMPLANT

## 2020-12-16 NOTE — Anesthesia Postprocedure Evaluation (Signed)
Anesthesia Post Note  Patient: Thomas Duarte  Procedure(s) Performed: GOLD SEED IMPLANT (N/A Prostate) SPACE OAR INSTILLATION (N/A Prostate)  Patient location during evaluation: PACU Anesthesia Type: General Level of consciousness: awake and alert and oriented Pain management: pain level controlled Vital Signs Assessment: post-procedure vital signs reviewed and stable Respiratory status: spontaneous breathing and respiratory function stable Cardiovascular status: blood pressure returned to baseline and stable Postop Assessment: no apparent nausea or vomiting Anesthetic complications: no   No complications documented.   Last Vitals:  Vitals:   12/16/20 1330 12/16/20 1349  BP: (!) 196/84 (!) 212/77  Pulse: (!) 52 (!) 54  Resp: 17 18  Temp:  36.6 C  SpO2: 100% 100%    Last Pain:  Vitals:   12/16/20 1349  TempSrc: Oral  PainSc: 0-No pain                 Jolicia Delira C Markiya Keefe

## 2020-12-16 NOTE — Interval H&P Note (Signed)
History and Physical Interval Note:  12/16/2020 11:43 AM  Thomas Duarte  has presented today for surgery, with the diagnosis of prostate cancer.  The various methods of treatment have been discussed with the patient and family. After consideration of risks, benefits and other options for treatment, the patient has consented to  Procedure(s) with comments: GOLD SEED IMPLANT (N/A) - Dr. requests time 1:00 SPACE OAR INSTILLATION (N/A) as a surgical intervention.  The patient's history has been reviewed, patient examined, no change in status, stable for surgery.  I have reviewed the patient's chart and labs.  Questions were answered to the patient's satisfaction.     Nicolette Bang

## 2020-12-16 NOTE — Anesthesia Procedure Notes (Signed)
Procedure Name: LMA Insertion Date/Time: 12/16/2020 12:08 PM Performed by: Myna Bright, CRNA Pre-anesthesia Checklist: Patient identified, Emergency Drugs available, Suction available and Patient being monitored Patient Re-evaluated:Patient Re-evaluated prior to induction Oxygen Delivery Method: Circle system utilized Preoxygenation: Pre-oxygenation with 100% oxygen Induction Type: IV induction Ventilation: Mask ventilation without difficulty LMA: LMA inserted LMA Size: 5.0 Number of attempts: 1 Placement Confirmation: positive ETCO2 and breath sounds checked- equal and bilateral Tube secured with: Tape Dental Injury: Teeth and Oropharynx as per pre-operative assessment

## 2020-12-16 NOTE — Op Note (Addendum)
PRE-OPERATIVE DIAGNOSIS:  Adenocarcinoma of the prostate  POST-OPERATIVE DIAGNOSIS:  Same  PROCEDURE: 1. Prostate Ultrasound 2. Placement of fiducial marks 3. Placement of SpaceOAR  SURGEON:  Surgeon(s): Nicolette Bang, MD  ANESTHESIA:  General  EBL:  Minimal  DRAINS: none  FINDINGS:  No hypoechoic lesions in the prostate 56.6 volume  INDICATION: Thomas Duarte is a 80 year old with a history of T1c prostate cancer who is scheduled to undergo IMRT. He wishes to have fiducial markers and SpaceOAR placed prior to IMRT to decrease rectal toxicity.  Description of procedure: After informed consent the patient was brought to the major OR, placed on the table and administered general anesthesia. He was then moved to the modified lithotomy position with his perineum perpendicular to the floor. His perineum and genitalia were then sterilely prepped. An official timeout was then performed. The transrectal ultrasound probe was placed in the rectum and affixed to the stand. He was then sterilely draped.  A transrectal ultrasound of the prostate was performed.  Lidocaine  was not  instilled using ultrasound guidance into the junction of each seminal vesicle of the prostate.  3 Gold markers were placed into the prostate using the standard template and ultrasound guidance.  Accurate placement of the markers was confirmed.  We then proceeded to mix the SpaceOAR using the kit supplied from the manufacturer. Once this was complete we placed a sinal needle into the perirectal fat between the rectum and the prostate. Once this was accomplished we injected 2cc of normal saline to hydrodissect the plain. We then instilled the the SpaceOAR through the spinal needle and noted good distribution in the perirectal fat.   The patient was awakened and taken to recovery room in stable and satisfactory condition. He tolerated procedure well and there were no intraoperative complications.  CONDITION: Stable,  extubated, transferred to PACU  PLAN: The patient is to be discharged home and he will start IMRT in the next 2-3 weeks

## 2020-12-16 NOTE — Discharge Instructions (Signed)
Transrectal Ultrasound-Guided Prostate Gold Seed Placement Transrectal ultrasound-guided prostate gold seed placement is a procedure to place small metal seeds (fiducial markers)in or around a tumor on the prostate. These seeds help show exactly where a tumor is located. This helps guide radiation therapy directly at the prostate tumor, which avoids killing nearby healthy tissue. During this procedure, a small device (probe) is lubricated and placed inside the rectum. The probe makes sound waves that create a picture of the prostate (transrectal ultrasound). The images will be used to help guide the placement of the gold seeds. This procedure is also called fiducial marker placement. Tell a health care provider about:  Any allergies you have.  All medicines you are taking, including vitamins, herbs, eye drops, creams, and over-the-counter medicines.  Any problems you or family members have had with anesthetic medicines.  Any medical conditions you have.  Any blood disorders you have.  Any surgeries you have had. What are the risks? Generally, this is a safe procedure. However, problems may occur, including:  Infection.  Bleeding.  Allergic reactions to medicines or dyes.  Damage to nearby structures or organs.  The gold seeds moving to another part of the body. This is rare. What happens before the procedure? Medicines  Ask your health care provider about: ? Changing or stopping your regular medicines. This is especially important if you are taking diabetes medicines or blood thinners. ? Taking over-the-counter medicines, vitamins, herbs, and supplements. ? Taking medicines such as aspirin and ibuprofen. These medicines can thin your blood. Do not take these medicines before your procedure unless your health care provider instructs you to do so.  You may be given antibiotic medicine to help prevent an infection. Staying hydrated Follow instructions from your health care provider  about hydration, which may include:  Up to 2 hours before the procedure - you may continue to drink clear liquids, such as water, clear fruit juice, black coffee, and plain tea. Eating and drinking restrictions Follow instructions from your health care provider about eating and drinking, which may include:  8 hours before the procedure - stop eating heavy meals or foods such as meat, fried foods, or fatty foods.  6 hours before the procedure - stop eating light meals or foods, such as toast or cereal.  6 hours before the procedure - stop drinking milk or drinks that contain milk.  2 hours before the procedure - stop drinking clear liquids. General instructions  If you were asked to do a bowel prep before your procedure, follow instructions from your health care provider about how to do this.  You may have a blood sample taken.  Plan to have a responsible adult take you home from the hospital or clinic.  Plan to have a responsible adult care for you for at least 24 hours after you leave the hospital or clinic. This is important. What happPATIENT INSTRUCTIONS POST-ANESTHESIA  IMMEDIATELY FOLLOWING SURGERY:  Do not drive or operate machinery for the first twenty four hours after surgery.  Do not make any important decisions for twenty four hours after surgery or while taking narcotic pain medications or sedatives.  If you develop intractable nausea and vomiting or a severe headache please notify your doctor immediately.  FOLLOW-UP:  Please make an appointment with your surgeon as instructed. You do not need to follow up with anesthesia unless specifically instructed to do so.  WOUND CARE INSTRUCTIONS (if applicable):  Keep a dry clean dressing on the anesthesia/puncture wound site if there  is drainage.  Once the wound has quit draining you may leave it open to air.  Generally you should leave the bandage intact for twenty four hours unless there is drainage.  If the epidural site drains for  more than 36-48 hours please call the anesthesia department.  QUESTIONS?:  Please feel free to call your physician or the hospital operator if you have any questions, and they will be happy to assist you.      ens during the procedure?  To lower your risk of infection: ? Your health care team will wash or sanitize their hands. ? Hair may be removed from the surgical area. ? Your skin will be washed with soap.  Monitors may be placed on your body to track your heart rate, blood pressure, and breathing.  An IV will be inserted into one of your veins.  You will be given one or more of the following: ? A medicine to help you relax (sedative). ? A medicine to numb the area (local anesthetic). ? A medicine that is injected into an area of your body to numb everything below the injection site (regional anesthetic).  A lubricated probe will be inserted into your rectum to perform the ultrasound.  You will be placed on your left side with your knees bent up toward your chest.  Using the ultrasound as a guide, the health care provider will insert a needle between your rectum and scrotum and will place it near the tumor.  The needle will inject the gold seed into the area around the tumor. This will be repeated with two more gold seeds. More seeds may be added depending on the size and location of the tumor.  The needle and probe will be removed. The procedure may vary among health care providers and hospitals.   What happens after the procedure?  You may have imaging tests done to check the placement of the gold seeds. This may include a CT scan or ultrasound.  Your blood pressure, heart rate, breathing rate, and blood oxygen level will continue to be monitored until the medicines you were given have worn off.  Do not drive for 24 hours if you were given a sedative.  You will be given medicine to help with pain, if needed. Summary  This procedure involves placing small metal seeds  (fiducial markers) in or around a prostate tumor.  The gold seeds help guide radiation therapy directly at the prostate tumor, which avoids killing nearby healthy tissue.  During this procedure, a small probe will be placed in the rectum to take images of the prostate (transrectal ultrasound). The images will help guide the placement of the gold seeds. This information is not intended to replace advice given to you by your health care provider. Make sure you discuss any questions you have with your health care provider. Document Revised: 04/19/2020 Document Reviewed: 04/19/2020 Elsevier Patient Education  2021 [employer].

## 2020-12-16 NOTE — Transfer of Care (Signed)
Immediate Anesthesia Transfer of Care Note  Patient: Thomas Duarte  Procedure(s) Performed: GOLD SEED IMPLANT (N/A Prostate) SPACE OAR INSTILLATION (N/A Prostate)  Patient Location: PACU  Anesthesia Type:General  Level of Consciousness: sedated, patient cooperative and responds to stimulation  Airway & Oxygen Therapy: Patient Spontanous Breathing and Patient connected to nasal cannula oxygen  Post-op Assessment: Report given to RN, Post -op Vital signs reviewed and stable and Patient moving all extremities  Post vital signs: Reviewed and stable  Last Vitals:  Vitals Value Taken Time  BP    Temp    Pulse    Resp    SpO2      Last Pain:  Vitals:   12/16/20 1145  TempSrc: Oral  PainSc: 0-No pain      Patients Stated Pain Goal: 8 (56/81/27 5170)  Complications: No complications documented.

## 2020-12-16 NOTE — Anesthesia Preprocedure Evaluation (Addendum)
Anesthesia Evaluation  Patient identified by MRN, date of birth, ID band Patient awake    Reviewed: Allergy & Precautions, NPO status , Patient's Chart, lab work & pertinent test results, reviewed documented beta blocker date and time   History of Anesthesia Complications Negative for: history of anesthetic complications  Airway Mallampati: III  TM Distance: >3 FB Neck ROM: Full    Dental  (+) Dental Advisory Given, Upper Dentures, Missing   Pulmonary neg pulmonary ROS,    Pulmonary exam normal breath sounds clear to auscultation       Cardiovascular Exercise Tolerance: Good hypertension, Pt. on medications and Pt. on home beta blockers Normal cardiovascular exam Rhythm:Regular Rate:Normal     Neuro/Psych CVA, Residual Symptoms negative psych ROS   GI/Hepatic negative GI ROS, Neg liver ROS,   Endo/Other  diabetes, Well Controlled, Type 2, Oral Hypoglycemic Agents  Renal/GU      Musculoskeletal negative musculoskeletal ROS (+)   Abdominal   Peds  Hematology negative hematology ROS (+)   Anesthesia Other Findings   Reproductive/Obstetrics negative OB ROS                            Anesthesia Physical Anesthesia Plan  ASA: III  Anesthesia Plan: General   Post-op Pain Management:    Induction: Intravenous  PONV Risk Score and Plan: Ondansetron  Airway Management Planned: LMA  Additional Equipment:   Intra-op Plan:   Post-operative Plan: Extubation in OR  Informed Consent: I have reviewed the patients History and Physical, chart, labs and discussed the procedure including the risks, benefits and alternatives for the proposed anesthesia with the patient or authorized representative who has indicated his/her understanding and acceptance.       Plan Discussed with: CRNA and Surgeon  Anesthesia Plan Comments:        Anesthesia Quick Evaluation

## 2020-12-17 ENCOUNTER — Encounter (HOSPITAL_COMMUNITY): Payer: Self-pay | Admitting: Urology

## 2021-01-01 ENCOUNTER — Encounter: Payer: Self-pay | Admitting: Urology

## 2021-01-01 ENCOUNTER — Ambulatory Visit (INDEPENDENT_AMBULATORY_CARE_PROVIDER_SITE_OTHER): Payer: Medicare HMO | Admitting: Urology

## 2021-01-01 ENCOUNTER — Other Ambulatory Visit: Payer: Self-pay

## 2021-01-01 VITALS — BP 128/82 | HR 67 | Temp 98.4°F

## 2021-01-01 DIAGNOSIS — C61 Malignant neoplasm of prostate: Secondary | ICD-10-CM | POA: Diagnosis not present

## 2021-01-01 LAB — URINALYSIS, ROUTINE W REFLEX MICROSCOPIC
Bilirubin, UA: NEGATIVE
Glucose, UA: NEGATIVE
Ketones, UA: NEGATIVE
Leukocytes,UA: NEGATIVE
Nitrite, UA: NEGATIVE
Protein,UA: NEGATIVE
RBC, UA: NEGATIVE
Specific Gravity, UA: 1.015 (ref 1.005–1.030)
Urobilinogen, Ur: 0.2 mg/dL (ref 0.2–1.0)
pH, UA: 5.5 (ref 5.0–7.5)

## 2021-01-01 LAB — BLADDER SCAN AMB NON-IMAGING: Scan Result: 9

## 2021-01-01 MED ORDER — LEUPROLIDE ACETATE (6 MONTH) 45 MG ~~LOC~~ KIT
45.0000 mg | PACK | Freq: Once | SUBCUTANEOUS | Status: AC
  Administered 2021-01-01: 45 mg via SUBCUTANEOUS

## 2021-01-01 NOTE — Patient Instructions (Signed)
Prostate Cancer  The prostate is a male gland that helps make semen. It is located below a man's bladder, in front of the rectum. Prostate cancer is when abnormal cells grow in this gland. What are the causes? The cause of this condition is not known. What increases the risk? You are more likely to develop this condition if:  You are 81 years of age or older.  You are African American.  You have a family history of prostate cancer.  You have a family history of breast cancer. What are the signs or symptoms? Symptoms of this condition include:  A need to pee often.  Peeing that is weak, or pee that stops and starts.  Trouble starting or stopping your pee.  Inability to pee.  Blood in your pee or semen.  Pain in the lower back, lower belly (abdomen), hips, or upper thighs.  Trouble getting an erection.  Trouble emptying all of your pee. How is this treated? Treatment for this condition depends on your age, your health, the kind of treatment you like, and how far the cancer has spread. Treatments include:  Being watched. This is called observation. You will be tested from time to time, but you will not get treated. Tests are to make sure that the cancer is not growing.  Surgery. This may be done to remove the prostate, to remove the testicles, or to freeze or kill cancer cells.  Radiation. This uses a strong beam to kill cancer cells.  Ultrasound energy. This uses strong sound waves to kill cancer cells.  Chemotherapy. This uses medicines that stop cancer cells from increasing. This kills cancer cells and healthy cells.  Targeted therapy. This kills cancer cells only. Healthy cells are not affected.  Hormone treatment. This stops the body from making hormones that help the cancer cells to grow. Follow these instructions at home:  Take over-the-counter and prescription medicines only as told by your doctor.  Eat a healthy diet.  Get plenty of sleep.  Ask your  doctor for help to find a support group for men with prostate cancer.  If you have to go to the hospital, let your cancer doctor (oncologist) know.  Treatment may affect your ability to have sex. Touch, hold, hug, and caress your partner to have intimate moments.  Keep all follow-up visits as told by your doctor. This is important. Contact a doctor if:  You have new or more trouble peeing.  You have new or more blood in your pee.  You have new or more pain in your hips, back, or chest. Get help right away if:  You have weakness in your legs.  You lose feeling in your legs.  You cannot control your pee or your poop (stool).  You have chills or a fever. Summary  The prostate is a male gland that helps make semen.  Prostate cancer is when abnormal cells grow in this gland.  Treatment includes doing surgery, using medicines, using very strong beams, or watching without treatment.  Ask your doctor for help to find a support group for men with prostate cancer.  Contact a doctor if you have problems peeing or have any new pain that you did not have before. This information is not intended to replace advice given to you by your health care provider. Make sure you discuss any questions you have with your health care provider. Document Revised: 07/18/2019 Document Reviewed: 07/18/2019 Elsevier Patient Education  2021 Elsevier Inc.  

## 2021-01-01 NOTE — Progress Notes (Signed)
post void residual=9  Urological Symptom Review  Patient is experiencing the following symptoms: Hard to postpone urination Burning/pain with urination Get up at night to urinate   Review of Systems  Gastrointestinal (upper)  : Negative for upper GI symptoms  Gastrointestinal (lower) : Negative for lower GI symptoms  Constitutional : negative  Skin: Negative for skin symptoms  Eyes: Blurred vision  Ear/Nose/Throat : Negative for Ear/Nose/Throat symptoms  Hematologic/Lymphatic: Negative for Hematologic/Lymphatic symptoms  Cardiovascular : Negative for cardiovascular symptoms  Respiratory : Negative for respiratory symptoms  Endocrine: Negative for endocrine symptoms  Musculoskeletal: Negative for musculoskeletal symptoms  Neurological: Negative for neurological symptoms  Psychologic: Negative for psychiatric symptoms   Eligard SubQ Injection   Due to Prostate Cancer patient is present today for a Eligard Injection.  Medication: Eligard 6 month Dose: 45 mg  Location: right  Lot: [id number] Exp: 06-2022  Patient tolerated well, no complications were noted  Performed by: Velda [place], LPN

## 2021-01-01 NOTE — Progress Notes (Signed)
01/01/2021 2:59 PM   Thomas Duarte 06-18-1941 [id number]  Referring provider: Monico Blitz, MD [address] [place],  [place] [address]  followup prostate cancer  HPI: Thomas Duarte is a 80yo here for followup for prostate cancer. He underwent fiducial markers and SpaceOAR 2 weeks ago. He is scheduled to start IMRT May 31st. No worsening LUTS. No complaints today   PMH: Past Medical History:  Diagnosis Date  . BPH (benign prostatic hyperplasia)   . Diabetes mellitus without complication ([place])   . Hypercholesteremia   . Hypertension   . Stroke ([place]) 2019   x2; numbness of right thumb and pointer and middle finger, no other deficits    Surgical History: Past Surgical History:  Procedure Laterality Date  . CATARACT EXTRACTION W/PHACO Left 10/21/2018   Procedure: CATARACT EXTRACTION PHACO AND INTRAOCULAR LENS PLACEMENT LEFT EYE;  Surgeon: Baruch Goldmann, MD;  Location: [facility];  Service: Ophthalmology;  Laterality: Left;  CDE: 5.18  . CATARACT EXTRACTION W/PHACO Right 06/23/2019   Procedure: CATARACT EXTRACTION PHACO AND INTRAOCULAR LENS PLACEMENT (IOC);  Surgeon: Baruch Goldmann, MD;  Location: [facility];  Service: Ophthalmology;  Laterality: Right;  CDE: 15.0  . EXPLORATORY LAPAROTOMY     removal of scar tissue.  Marland Kitchen GOLD SEED IMPLANT N/A 12/16/2020   Procedure: GOLD SEED IMPLANT;  Surgeon: Cleon Gustin, MD;  Location: [facility];  Service: Urology;  Laterality: N/A;  Dr. requests time 1:00  . HERNIA REPAIR Left 1960   Inguinal  . IR GENERIC HISTORICAL  04/28/2016   IR RADIOLOGIST EVAL & MGMT 04/28/2016 [facility]  . SPACE OAR INSTILLATION N/A 12/16/2020   Procedure: SPACE OAR INSTILLATION;  Surgeon: Cleon Gustin, MD;  Location: [facility];  Service: Urology;  Laterality: N/A;  . TONSILLECTOMY  1952   adenoidectomy    Home Medications:  Allergies as of 01/01/2021   No Known Allergies     Medication List       Accurate as of Jan 01, 2021  2:59 PM. If you have any questions, ask  your nurse or doctor.        aspirin 325 MG tablet Take 325 mg by mouth daily.   atorvastatin 40 MG tablet Commonly known as: LIPITOR Take 40 mg by mouth at bedtime.   clopidogrel 75 MG tablet Commonly known as: PLAVIX Take 75 mg by mouth daily.   furosemide 40 MG tablet Commonly known as: LASIX Take 40 mg by mouth 2 (two) times daily.   Glycerin-Hypromellose-PEG 400 0.2-0.2-1 % Soln Place 1 drop into both eyes 3 (three) times daily as needed (dry eyes).   Klor-Con M10 10 MEQ tablet Generic drug: potassium chloride Take 10 mEq by mouth daily.   lisinopril 40 MG tablet Commonly known as: ZESTRIL Take 40 mg by mouth daily.   metFORMIN 500 MG tablet Commonly known as: GLUCOPHAGE Take 500 mg by mouth daily with breakfast.   metoprolol succinate 25 MG 24 hr tablet Commonly known as: TOPROL-XL Take 25 mg by mouth daily.   OneTouch Delica Plus [id number] Misc SMARTSIG:1 Strip(s) Topical Daily   OneTouch Verio test strip Generic drug: glucose blood 1 each daily.   tamsulosin 0.4 MG Caps capsule Commonly known as: FLOMAX Take 0.8 mg by mouth at bedtime.   tobramycin 0.3 % ophthalmic solution Commonly known as: TOBREX SMARTSIG:In Eye(s)       Allergies: No Known Allergies  Family History: No family history on file.  Social History:  reports that he has never smoked.  He has never used smokeless tobacco. He reports that he does not drink alcohol and does not use drugs.  ROS: All other review of systems were reviewed and are negative except what is noted above in HPI  Physical Exam: BP 128/82   Pulse 67   Temp 98.4 F (36.9 C)   Constitutional:  Alert and oriented, No acute distress. HEENT: [place] AT, moist mucus membranes.  Trachea midline, no masses. Cardiovascular: No clubbing, cyanosis, or edema. Respiratory: Normal respiratory effort, no increased work of breathing. GI: Abdomen is soft, nontender, nondistended, no abdominal masses GU: No CVA tenderness.   Lymph: No cervical or inguinal lymphadenopathy. Skin: No rashes, bruises or suspicious lesions. Neurologic: Grossly intact, no focal deficits, moving all 4 extremities. Psychiatric: Normal mood and affect.  Laboratory Data: No results found for: WBC, HGB, HCT, MCV, PLT  Lab Results  Component Value Date   CREATININE 2.07 (H) 12/13/2020    No results found for: PSA  Lab Results  Component Value Date   TESTOSTERONE 9 (L) 11/22/2020    Lab Results  Component Value Date   HGBA1C 7.0 (H) 12/13/2020    Urinalysis    Component Value Date/Time   APPEARANCEUR Clear 11/22/2020 1351   GLUCOSEU Negative 11/22/2020 1351   BILIRUBINUR Negative 11/22/2020 1351   PROTEINUR 2+ (A) 11/22/2020 1351   NITRITE Negative 11/22/2020 1351   LEUKOCYTESUR Negative 11/22/2020 1351    Lab Results  Component Value Date   LABMICR See below: 11/22/2020   WBCUA 0-5 11/22/2020   LABEPIT None seen 11/22/2020   MUCUS Present 11/22/2020   BACTERIA Moderate (A) 11/22/2020    Pertinent Imaging:  No results found for this or any previous visit.  No results found for this or any previous visit.  No results found for this or any previous visit.  No results found for this or any previous visit.  No results found for this or any previous visit.  No results found for this or any previous visit.  No results found for this or any previous visit.  No results found for this or any previous visit.   Assessment & Plan:    1. Prostate cancer [facility]) Patient to proceed with IMRT. RTC 8 weeks - Urinalysis, Routine w reflex microscopic - BLADDER SCAN AMB NON-IMAGING   No follow-ups on file.  Nicolette Bang, MD  [facility] [place]

## 2021-01-07 DIAGNOSIS — C61 Malignant neoplasm of prostate: Secondary | ICD-10-CM | POA: Diagnosis not present

## 2021-01-07 DIAGNOSIS — I10 Essential (primary) hypertension: Secondary | ICD-10-CM | POA: Diagnosis not present

## 2021-01-07 DIAGNOSIS — I509 Heart failure, unspecified: Secondary | ICD-10-CM | POA: Diagnosis not present

## 2021-01-07 DIAGNOSIS — Z6827 Body mass index (BMI) 27.0-27.9, adult: Secondary | ICD-10-CM | POA: Diagnosis not present

## 2021-01-07 DIAGNOSIS — Z299 Encounter for prophylactic measures, unspecified: Secondary | ICD-10-CM | POA: Diagnosis not present

## 2021-01-14 DIAGNOSIS — C61 Malignant neoplasm of prostate: Secondary | ICD-10-CM | POA: Diagnosis not present

## 2021-01-15 DIAGNOSIS — C61 Malignant neoplasm of prostate: Secondary | ICD-10-CM | POA: Diagnosis not present

## 2021-01-16 DIAGNOSIS — C61 Malignant neoplasm of prostate: Secondary | ICD-10-CM | POA: Diagnosis not present

## 2021-01-17 DIAGNOSIS — C61 Malignant neoplasm of prostate: Secondary | ICD-10-CM | POA: Diagnosis not present

## 2021-01-20 DIAGNOSIS — C61 Malignant neoplasm of prostate: Secondary | ICD-10-CM | POA: Diagnosis not present

## 2021-01-20 DIAGNOSIS — I70211 Atherosclerosis of native arteries of extremities with intermittent claudication, right leg: Secondary | ICD-10-CM | POA: Diagnosis not present

## 2021-01-20 DIAGNOSIS — I70203 Unspecified atherosclerosis of native arteries of extremities, bilateral legs: Secondary | ICD-10-CM | POA: Diagnosis not present

## 2021-01-21 DIAGNOSIS — C61 Malignant neoplasm of prostate: Secondary | ICD-10-CM | POA: Diagnosis not present

## 2021-01-22 DIAGNOSIS — C61 Malignant neoplasm of prostate: Secondary | ICD-10-CM | POA: Diagnosis not present

## 2021-01-23 DIAGNOSIS — C61 Malignant neoplasm of prostate: Secondary | ICD-10-CM | POA: Diagnosis not present

## 2021-01-24 DIAGNOSIS — C61 Malignant neoplasm of prostate: Secondary | ICD-10-CM | POA: Diagnosis not present

## 2021-01-27 DIAGNOSIS — C61 Malignant neoplasm of prostate: Secondary | ICD-10-CM | POA: Diagnosis not present

## 2021-01-28 DIAGNOSIS — C61 Malignant neoplasm of prostate: Secondary | ICD-10-CM | POA: Diagnosis not present

## 2021-01-29 DIAGNOSIS — C61 Malignant neoplasm of prostate: Secondary | ICD-10-CM | POA: Diagnosis not present

## 2021-01-30 DIAGNOSIS — I739 Peripheral vascular disease, unspecified: Secondary | ICD-10-CM | POA: Diagnosis not present

## 2021-01-30 DIAGNOSIS — C61 Malignant neoplasm of prostate: Secondary | ICD-10-CM | POA: Diagnosis not present

## 2021-01-30 DIAGNOSIS — Z299 Encounter for prophylactic measures, unspecified: Secondary | ICD-10-CM | POA: Diagnosis not present

## 2021-01-30 DIAGNOSIS — I10 Essential (primary) hypertension: Secondary | ICD-10-CM | POA: Diagnosis not present

## 2021-01-30 DIAGNOSIS — I509 Heart failure, unspecified: Secondary | ICD-10-CM | POA: Diagnosis not present

## 2021-01-31 DIAGNOSIS — I739 Peripheral vascular disease, unspecified: Secondary | ICD-10-CM | POA: Diagnosis not present

## 2021-01-31 DIAGNOSIS — Z299 Encounter for prophylactic measures, unspecified: Secondary | ICD-10-CM | POA: Diagnosis not present

## 2021-01-31 DIAGNOSIS — I509 Heart failure, unspecified: Secondary | ICD-10-CM | POA: Diagnosis not present

## 2021-01-31 DIAGNOSIS — I10 Essential (primary) hypertension: Secondary | ICD-10-CM | POA: Diagnosis not present

## 2021-01-31 DIAGNOSIS — C61 Malignant neoplasm of prostate: Secondary | ICD-10-CM | POA: Diagnosis not present

## 2021-02-03 DIAGNOSIS — C61 Malignant neoplasm of prostate: Secondary | ICD-10-CM | POA: Diagnosis not present

## 2021-02-04 DIAGNOSIS — C61 Malignant neoplasm of prostate: Secondary | ICD-10-CM | POA: Diagnosis not present

## 2021-02-05 DIAGNOSIS — C61 Malignant neoplasm of prostate: Secondary | ICD-10-CM | POA: Diagnosis not present

## 2021-02-06 DIAGNOSIS — I10 Essential (primary) hypertension: Secondary | ICD-10-CM | POA: Diagnosis not present

## 2021-02-06 DIAGNOSIS — C61 Malignant neoplasm of prostate: Secondary | ICD-10-CM | POA: Diagnosis not present

## 2021-02-06 DIAGNOSIS — I5032 Chronic diastolic (congestive) heart failure: Secondary | ICD-10-CM | POA: Diagnosis not present

## 2021-02-06 DIAGNOSIS — E785 Hyperlipidemia, unspecified: Secondary | ICD-10-CM | POA: Diagnosis not present

## 2021-02-07 DIAGNOSIS — C61 Malignant neoplasm of prostate: Secondary | ICD-10-CM | POA: Diagnosis not present

## 2021-02-10 DIAGNOSIS — C61 Malignant neoplasm of prostate: Secondary | ICD-10-CM | POA: Diagnosis not present

## 2021-02-11 ENCOUNTER — Other Ambulatory Visit: Payer: Self-pay

## 2021-02-11 DIAGNOSIS — C61 Malignant neoplasm of prostate: Secondary | ICD-10-CM | POA: Diagnosis not present

## 2021-02-12 DIAGNOSIS — C61 Malignant neoplasm of prostate: Secondary | ICD-10-CM | POA: Diagnosis not present

## 2021-02-13 DIAGNOSIS — C61 Malignant neoplasm of prostate: Secondary | ICD-10-CM | POA: Diagnosis not present

## 2021-02-14 DIAGNOSIS — C61 Malignant neoplasm of prostate: Secondary | ICD-10-CM | POA: Diagnosis not present

## 2021-02-18 DIAGNOSIS — C61 Malignant neoplasm of prostate: Secondary | ICD-10-CM | POA: Diagnosis not present

## 2021-02-19 DIAGNOSIS — R030 Elevated blood-pressure reading, without diagnosis of hypertension: Secondary | ICD-10-CM | POA: Diagnosis not present

## 2021-02-19 DIAGNOSIS — I420 Dilated cardiomyopathy: Secondary | ICD-10-CM | POA: Diagnosis not present

## 2021-02-19 DIAGNOSIS — E1165 Type 2 diabetes mellitus with hyperglycemia: Secondary | ICD-10-CM | POA: Diagnosis not present

## 2021-02-19 DIAGNOSIS — G8194 Hemiplegia, unspecified affecting left nondominant side: Secondary | ICD-10-CM | POA: Diagnosis not present

## 2021-02-19 DIAGNOSIS — E1122 Type 2 diabetes mellitus with diabetic chronic kidney disease: Secondary | ICD-10-CM | POA: Diagnosis not present

## 2021-02-19 DIAGNOSIS — E11319 Type 2 diabetes mellitus with unspecified diabetic retinopathy without macular edema: Secondary | ICD-10-CM | POA: Diagnosis not present

## 2021-02-19 DIAGNOSIS — C61 Malignant neoplasm of prostate: Secondary | ICD-10-CM | POA: Diagnosis not present

## 2021-02-19 DIAGNOSIS — Z299 Encounter for prophylactic measures, unspecified: Secondary | ICD-10-CM | POA: Diagnosis not present

## 2021-02-19 DIAGNOSIS — Z6827 Body mass index (BMI) 27.0-27.9, adult: Secondary | ICD-10-CM | POA: Diagnosis not present

## 2021-02-19 DIAGNOSIS — I10 Essential (primary) hypertension: Secondary | ICD-10-CM | POA: Diagnosis not present

## 2021-02-20 DIAGNOSIS — C61 Malignant neoplasm of prostate: Secondary | ICD-10-CM | POA: Diagnosis not present

## 2021-02-21 DIAGNOSIS — C61 Malignant neoplasm of prostate: Secondary | ICD-10-CM | POA: Diagnosis not present

## 2021-02-24 DIAGNOSIS — C61 Malignant neoplasm of prostate: Secondary | ICD-10-CM | POA: Diagnosis not present

## 2021-02-25 DIAGNOSIS — H43813 Vitreous degeneration, bilateral: Secondary | ICD-10-CM | POA: Diagnosis not present

## 2021-02-25 DIAGNOSIS — E113313 Type 2 diabetes mellitus with moderate nonproliferative diabetic retinopathy with macular edema, bilateral: Secondary | ICD-10-CM | POA: Diagnosis not present

## 2021-02-25 DIAGNOSIS — C61 Malignant neoplasm of prostate: Secondary | ICD-10-CM | POA: Diagnosis not present

## 2021-02-25 DIAGNOSIS — H35033 Hypertensive retinopathy, bilateral: Secondary | ICD-10-CM | POA: Diagnosis not present

## 2021-02-25 DIAGNOSIS — H35372 Puckering of macula, left eye: Secondary | ICD-10-CM | POA: Diagnosis not present

## 2021-04-02 DIAGNOSIS — Z299 Encounter for prophylactic measures, unspecified: Secondary | ICD-10-CM | POA: Diagnosis not present

## 2021-04-02 DIAGNOSIS — I779 Disorder of arteries and arterioles, unspecified: Secondary | ICD-10-CM | POA: Diagnosis not present

## 2021-04-02 DIAGNOSIS — I509 Heart failure, unspecified: Secondary | ICD-10-CM | POA: Diagnosis not present

## 2021-04-02 DIAGNOSIS — I10 Essential (primary) hypertension: Secondary | ICD-10-CM | POA: Diagnosis not present

## 2021-04-02 DIAGNOSIS — R110 Nausea: Secondary | ICD-10-CM | POA: Diagnosis not present

## 2021-04-10 ENCOUNTER — Inpatient Hospital Stay (HOSPITAL_COMMUNITY)
Admission: EM | Admit: 2021-04-10 | Discharge: 2021-05-04 | DRG: 682 | Disposition: A | Discharge status: Skilled Nursing Facility | Payer: Medicare HMO | Attending: Family Medicine | Admitting: Family Medicine

## 2021-04-10 ENCOUNTER — Emergency Department (HOSPITAL_COMMUNITY): Payer: Medicare HMO

## 2021-04-10 ENCOUNTER — Other Ambulatory Visit: Payer: Self-pay

## 2021-04-10 ENCOUNTER — Encounter (HOSPITAL_COMMUNITY): Payer: Self-pay

## 2021-04-10 DIAGNOSIS — N179 Acute kidney failure, unspecified: Secondary | ICD-10-CM | POA: Diagnosis not present

## 2021-04-10 DIAGNOSIS — K5669 Other partial intestinal obstruction: Secondary | ICD-10-CM | POA: Diagnosis not present

## 2021-04-10 DIAGNOSIS — D62 Acute posthemorrhagic anemia: Secondary | ICD-10-CM | POA: Diagnosis not present

## 2021-04-10 DIAGNOSIS — K567 Ileus, unspecified: Secondary | ICD-10-CM

## 2021-04-10 DIAGNOSIS — K2970 Gastritis, unspecified, without bleeding: Secondary | ICD-10-CM | POA: Diagnosis not present

## 2021-04-10 DIAGNOSIS — R1310 Dysphagia, unspecified: Secondary | ICD-10-CM

## 2021-04-10 DIAGNOSIS — Z7902 Long term (current) use of antithrombotics/antiplatelets: Secondary | ICD-10-CM | POA: Diagnosis not present

## 2021-04-10 DIAGNOSIS — I248 Other forms of acute ischemic heart disease: Secondary | ICD-10-CM | POA: Diagnosis present

## 2021-04-10 DIAGNOSIS — K56600 Partial intestinal obstruction, unspecified as to cause: Secondary | ICD-10-CM | POA: Diagnosis not present

## 2021-04-10 DIAGNOSIS — R001 Bradycardia, unspecified: Secondary | ICD-10-CM | POA: Diagnosis not present

## 2021-04-10 DIAGNOSIS — Z0189 Encounter for other specified special examinations: Secondary | ICD-10-CM

## 2021-04-10 DIAGNOSIS — R972 Elevated prostate specific antigen [PSA]: Secondary | ICD-10-CM

## 2021-04-10 DIAGNOSIS — Z923 Personal history of irradiation: Secondary | ICD-10-CM

## 2021-04-10 DIAGNOSIS — E870 Hyperosmolality and hypernatremia: Secondary | ICD-10-CM | POA: Diagnosis not present

## 2021-04-10 DIAGNOSIS — E861 Hypovolemia: Secondary | ICD-10-CM | POA: Diagnosis present

## 2021-04-10 DIAGNOSIS — K209 Esophagitis, unspecified without bleeding: Secondary | ICD-10-CM

## 2021-04-10 DIAGNOSIS — K259 Gastric ulcer, unspecified as acute or chronic, without hemorrhage or perforation: Secondary | ICD-10-CM

## 2021-04-10 DIAGNOSIS — K2091 Esophagitis, unspecified with bleeding: Secondary | ICD-10-CM | POA: Diagnosis not present

## 2021-04-10 DIAGNOSIS — E119 Type 2 diabetes mellitus without complications: Secondary | ICD-10-CM | POA: Diagnosis not present

## 2021-04-10 DIAGNOSIS — I959 Hypotension, unspecified: Secondary | ICD-10-CM | POA: Diagnosis not present

## 2021-04-10 DIAGNOSIS — E1122 Type 2 diabetes mellitus with diabetic chronic kidney disease: Secondary | ICD-10-CM | POA: Diagnosis present

## 2021-04-10 DIAGNOSIS — R109 Unspecified abdominal pain: Secondary | ICD-10-CM

## 2021-04-10 DIAGNOSIS — K6389 Other specified diseases of intestine: Secondary | ICD-10-CM | POA: Diagnosis not present

## 2021-04-10 DIAGNOSIS — C61 Malignant neoplasm of prostate: Secondary | ICD-10-CM | POA: Diagnosis present

## 2021-04-10 DIAGNOSIS — I10 Essential (primary) hypertension: Secondary | ICD-10-CM | POA: Diagnosis present

## 2021-04-10 DIAGNOSIS — K254 Chronic or unspecified gastric ulcer with hemorrhage: Secondary | ICD-10-CM | POA: Diagnosis not present

## 2021-04-10 DIAGNOSIS — Z4659 Encounter for fitting and adjustment of other gastrointestinal appliance and device: Secondary | ICD-10-CM

## 2021-04-10 DIAGNOSIS — R5383 Other fatigue: Secondary | ICD-10-CM | POA: Diagnosis not present

## 2021-04-10 DIAGNOSIS — M48061 Spinal stenosis, lumbar region without neurogenic claudication: Secondary | ICD-10-CM | POA: Diagnosis not present

## 2021-04-10 DIAGNOSIS — I130 Hypertensive heart and chronic kidney disease with heart failure and stage 1 through stage 4 chronic kidney disease, or unspecified chronic kidney disease: Secondary | ICD-10-CM | POA: Diagnosis present

## 2021-04-10 DIAGNOSIS — I639 Cerebral infarction, unspecified: Secondary | ICD-10-CM | POA: Diagnosis present

## 2021-04-10 DIAGNOSIS — J690 Pneumonitis due to inhalation of food and vomit: Secondary | ICD-10-CM | POA: Diagnosis not present

## 2021-04-10 DIAGNOSIS — R140 Abdominal distension (gaseous): Secondary | ICD-10-CM | POA: Diagnosis not present

## 2021-04-10 DIAGNOSIS — E440 Moderate protein-calorie malnutrition: Secondary | ICD-10-CM | POA: Insufficient documentation

## 2021-04-10 DIAGNOSIS — M5116 Intervertebral disc disorders with radiculopathy, lumbar region: Secondary | ICD-10-CM | POA: Diagnosis not present

## 2021-04-10 DIAGNOSIS — Z20822 Contact with and (suspected) exposure to covid-19: Secondary | ICD-10-CM | POA: Diagnosis present

## 2021-04-10 DIAGNOSIS — Z8546 Personal history of malignant neoplasm of prostate: Secondary | ICD-10-CM

## 2021-04-10 DIAGNOSIS — N184 Chronic kidney disease, stage 4 (severe): Secondary | ICD-10-CM

## 2021-04-10 DIAGNOSIS — R1110 Vomiting, unspecified: Secondary | ICD-10-CM | POA: Diagnosis not present

## 2021-04-10 DIAGNOSIS — I517 Cardiomegaly: Secondary | ICD-10-CM | POA: Diagnosis not present

## 2021-04-10 DIAGNOSIS — R002 Palpitations: Secondary | ICD-10-CM | POA: Diagnosis not present

## 2021-04-10 DIAGNOSIS — R531 Weakness: Secondary | ICD-10-CM | POA: Diagnosis not present

## 2021-04-10 DIAGNOSIS — M7138 Other bursal cyst, other site: Secondary | ICD-10-CM | POA: Diagnosis present

## 2021-04-10 DIAGNOSIS — I739 Peripheral vascular disease, unspecified: Secondary | ICD-10-CM | POA: Diagnosis not present

## 2021-04-10 DIAGNOSIS — Z79899 Other long term (current) drug therapy: Secondary | ICD-10-CM | POA: Diagnosis not present

## 2021-04-10 DIAGNOSIS — R112 Nausea with vomiting, unspecified: Secondary | ICD-10-CM | POA: Diagnosis not present

## 2021-04-10 DIAGNOSIS — I509 Heart failure, unspecified: Secondary | ICD-10-CM | POA: Diagnosis not present

## 2021-04-10 DIAGNOSIS — D631 Anemia in chronic kidney disease: Secondary | ICD-10-CM | POA: Diagnosis present

## 2021-04-10 DIAGNOSIS — N39490 Overflow incontinence: Secondary | ICD-10-CM | POA: Diagnosis present

## 2021-04-10 DIAGNOSIS — I420 Dilated cardiomyopathy: Secondary | ICD-10-CM | POA: Diagnosis present

## 2021-04-10 DIAGNOSIS — R338 Other retention of urine: Secondary | ICD-10-CM

## 2021-04-10 DIAGNOSIS — K56609 Unspecified intestinal obstruction, unspecified as to partial versus complete obstruction: Secondary | ICD-10-CM

## 2021-04-10 DIAGNOSIS — Z4682 Encounter for fitting and adjustment of non-vascular catheter: Secondary | ICD-10-CM | POA: Diagnosis not present

## 2021-04-10 DIAGNOSIS — R0689 Other abnormalities of breathing: Secondary | ICD-10-CM | POA: Diagnosis not present

## 2021-04-10 DIAGNOSIS — N401 Enlarged prostate with lower urinary tract symptoms: Secondary | ICD-10-CM | POA: Diagnosis present

## 2021-04-10 DIAGNOSIS — D692 Other nonthrombocytopenic purpura: Secondary | ICD-10-CM | POA: Diagnosis not present

## 2021-04-10 DIAGNOSIS — Z7984 Long term (current) use of oral hypoglycemic drugs: Secondary | ICD-10-CM | POA: Diagnosis not present

## 2021-04-10 DIAGNOSIS — E7800 Pure hypercholesterolemia, unspecified: Secondary | ICD-10-CM | POA: Diagnosis present

## 2021-04-10 DIAGNOSIS — Z452 Encounter for adjustment and management of vascular access device: Secondary | ICD-10-CM | POA: Diagnosis not present

## 2021-04-10 DIAGNOSIS — I493 Ventricular premature depolarization: Secondary | ICD-10-CM | POA: Diagnosis present

## 2021-04-10 DIAGNOSIS — I5032 Chronic diastolic (congestive) heart failure: Secondary | ICD-10-CM | POA: Diagnosis present

## 2021-04-10 DIAGNOSIS — Z7982 Long term (current) use of aspirin: Secondary | ICD-10-CM

## 2021-04-10 DIAGNOSIS — K3189 Other diseases of stomach and duodenum: Secondary | ICD-10-CM | POA: Diagnosis not present

## 2021-04-10 DIAGNOSIS — K922 Gastrointestinal hemorrhage, unspecified: Secondary | ICD-10-CM | POA: Diagnosis not present

## 2021-04-10 DIAGNOSIS — R339 Retention of urine, unspecified: Secondary | ICD-10-CM | POA: Diagnosis present

## 2021-04-10 DIAGNOSIS — N2889 Other specified disorders of kidney and ureter: Secondary | ICD-10-CM | POA: Diagnosis not present

## 2021-04-10 DIAGNOSIS — K5939 Other megacolon: Secondary | ICD-10-CM | POA: Diagnosis not present

## 2021-04-10 DIAGNOSIS — E876 Hypokalemia: Secondary | ICD-10-CM | POA: Diagnosis not present

## 2021-04-10 DIAGNOSIS — E785 Hyperlipidemia, unspecified: Secondary | ICD-10-CM | POA: Diagnosis not present

## 2021-04-10 DIAGNOSIS — Z299 Encounter for prophylactic measures, unspecified: Secondary | ICD-10-CM | POA: Diagnosis not present

## 2021-04-10 DIAGNOSIS — Z743 Need for continuous supervision: Secondary | ICD-10-CM | POA: Diagnosis not present

## 2021-04-10 DIAGNOSIS — R32 Unspecified urinary incontinence: Secondary | ICD-10-CM | POA: Diagnosis present

## 2021-04-10 DIAGNOSIS — Z6827 Body mass index (BMI) 27.0-27.9, adult: Secondary | ICD-10-CM

## 2021-04-10 DIAGNOSIS — R5381 Other malaise: Secondary | ICD-10-CM | POA: Diagnosis not present

## 2021-04-10 DIAGNOSIS — R0602 Shortness of breath: Secondary | ICD-10-CM | POA: Diagnosis not present

## 2021-04-10 DIAGNOSIS — K631 Perforation of intestine (nontraumatic): Secondary | ICD-10-CM

## 2021-04-10 LAB — CBC WITH DIFFERENTIAL/PLATELET
Abs Immature Granulocytes: 0.05 10*3/uL (ref 0.00–0.07)
Basophils Absolute: 0 10*3/uL (ref 0.0–0.1)
Basophils Relative: 1 %
Eosinophils Absolute: 0.2 10*3/uL (ref 0.0–0.5)
Eosinophils Relative: 2 %
HCT: 27.8 % — ABNORMAL LOW (ref 39.0–52.0)
Hemoglobin: 9 g/dL — ABNORMAL LOW (ref 13.0–17.0)
Immature Granulocytes: 1 %
Lymphocytes Relative: 11 %
Lymphs Abs: 0.7 10*3/uL (ref 0.7–4.0)
MCH: 32.1 pg (ref 26.0–34.0)
MCHC: 32.4 g/dL (ref 30.0–36.0)
MCV: 99.3 fL (ref 80.0–100.0)
Monocytes Absolute: 0.9 10*3/uL (ref 0.1–1.0)
Monocytes Relative: 14 %
Neutro Abs: 4.7 10*3/uL (ref 1.7–7.7)
Neutrophils Relative %: 71 %
Platelets: 144 10*3/uL — ABNORMAL LOW (ref 150–400)
RBC: 2.8 MIL/uL — ABNORMAL LOW (ref 4.22–5.81)
RDW: 13.4 % (ref 11.5–15.5)
WBC: 6.5 10*3/uL (ref 4.0–10.5)
nRBC: 0 % (ref 0.0–0.2)

## 2021-04-10 LAB — I-STAT VENOUS BLOOD GAS, ED
Acid-base deficit: 12 mmol/L — ABNORMAL HIGH (ref 0.0–2.0)
Bicarbonate: 15 mmol/L — ABNORMAL LOW (ref 20.0–28.0)
Calcium, Ion: 1.33 mmol/L (ref 1.15–1.40)
HCT: 26 % — ABNORMAL LOW (ref 39.0–52.0)
Hemoglobin: 8.8 g/dL — ABNORMAL LOW (ref 13.0–17.0)
O2 Saturation: 52 %
Potassium: 4.4 mmol/L (ref 3.5–5.1)
Sodium: 146 mmol/L — ABNORMAL HIGH (ref 135–145)
TCO2: 16 mmol/L — ABNORMAL LOW (ref 22–32)
pCO2, Ven: 36.5 mmHg — ABNORMAL LOW (ref 44.0–60.0)
pH, Ven: 7.221 — ABNORMAL LOW (ref 7.250–7.430)
pO2, Ven: 33 mmHg (ref 32.0–45.0)

## 2021-04-10 LAB — TROPONIN I (HIGH SENSITIVITY)
Troponin I (High Sensitivity): 59 ng/L — ABNORMAL HIGH (ref <18)
Troponin I (High Sensitivity): 57 ng/L — ABNORMAL HIGH (ref <18)

## 2021-04-10 LAB — COMPREHENSIVE METABOLIC PANEL
ALT: 11 U/L (ref 0–44)
AST: 13 U/L — ABNORMAL LOW (ref 15–41)
Albumin: 2.9 g/dL — ABNORMAL LOW (ref 3.5–5.0)
Alkaline Phosphatase: 89 U/L (ref 38–126)
Anion gap: 6 (ref 5–15)
BUN: 84 mg/dL — ABNORMAL HIGH (ref 8–23)
CO2: 17 mmol/L — ABNORMAL LOW (ref 22–32)
Calcium: 9.1 mg/dL (ref 8.9–10.3)
Chloride: 118 mmol/L — ABNORMAL HIGH (ref 98–111)
Creatinine, Ser: 3.46 mg/dL — ABNORMAL HIGH (ref 0.61–1.24)
GFR, Estimated: 17 mL/min — ABNORMAL LOW (ref >60)
Glucose, Bld: 152 mg/dL — ABNORMAL HIGH (ref 70–99)
Potassium: 4.4 mmol/L (ref 3.5–5.1)
Sodium: 141 mmol/L (ref 135–145)
Total Bilirubin: 0.3 mg/dL (ref 0.3–1.2)
Total Protein: 5.8 g/dL — ABNORMAL LOW (ref 6.5–8.1)

## 2021-04-10 LAB — IRON AND TIBC
Iron: 51 ug/dL (ref 45–182)
Saturation Ratios: 24 % (ref 17.9–39.5)
TIBC: 217 ug/dL — ABNORMAL LOW (ref 250–450)
UIBC: 166 ug/dL

## 2021-04-10 LAB — RESP PANEL BY RT-PCR (FLU A&B, COVID) ARPGX2
Influenza A by PCR: NEGATIVE
Influenza B by PCR: NEGATIVE
SARS Coronavirus 2 by RT PCR: NEGATIVE

## 2021-04-10 LAB — RETICULOCYTES
Immature Retic Fract: 8.7 % (ref 2.3–15.9)
RBC.: 2.8 MIL/uL — ABNORMAL LOW (ref 4.22–5.81)
Retic Count, Absolute: 38.4 10*3/uL (ref 19.0–186.0)
Retic Ct Pct: 1.4 % (ref 0.4–3.1)

## 2021-04-10 LAB — HEMOGLOBIN A1C
Hgb A1c MFr Bld: 7.1 % — ABNORMAL HIGH (ref 4.8–5.6)
Mean Plasma Glucose: 157.07 mg/dL

## 2021-04-10 LAB — URIC ACID: Uric Acid, Serum: 8.7 mg/dL — ABNORMAL HIGH (ref 3.7–8.6)

## 2021-04-10 LAB — TSH: TSH: 0.974 u[IU]/mL (ref 0.350–4.500)

## 2021-04-10 LAB — MAGNESIUM: Magnesium: 2.1 mg/dL (ref 1.7–2.4)

## 2021-04-10 LAB — BRAIN NATRIURETIC PEPTIDE: B Natriuretic Peptide: 208.1 pg/mL — ABNORMAL HIGH (ref 0.0–100.0)

## 2021-04-10 LAB — LIPASE, BLOOD: Lipase: 42 U/L (ref 11–51)

## 2021-04-10 LAB — CK: Total CK: 62 U/L (ref 49–397)

## 2021-04-10 LAB — CBG MONITORING, ED: Glucose-Capillary: 131 mg/dL — ABNORMAL HIGH (ref 70–99)

## 2021-04-10 LAB — GLUCOSE, CAPILLARY: Glucose-Capillary: 134 mg/dL — ABNORMAL HIGH (ref 70–99)

## 2021-04-10 MED ORDER — GLYCERIN-HYPROMELLOSE-PEG 400 0.2-0.2-1 % OP SOLN: 1.0000 [drp] | Freq: Three times a day (TID) | OPHTHALMIC | Status: DC | PRN

## 2021-04-10 MED ORDER — ASPIRIN 325 MG PO TABS
325.0000 mg | ORAL_TABLET | Freq: Every day | ORAL | Status: DC
  Administered 2021-04-10 – 2021-04-22 (×12): 325 mg via ORAL
  Filled 2021-04-10 (×13): qty 1

## 2021-04-10 MED ORDER — HYDRALAZINE HCL 25 MG PO TABS
25.0000 mg | ORAL_TABLET | Freq: Four times a day (QID) | ORAL | Status: DC | PRN
  Administered 2021-04-10 – 2021-04-12 (×5): 25 mg via ORAL
  Filled 2021-04-10 (×4): qty 1

## 2021-04-10 MED ORDER — POLYVINYL ALCOHOL 1.4 % OP SOLN
1.0000 [drp] | OPHTHALMIC | Status: DC | PRN
  Administered 2021-04-12 – 2021-04-19 (×6): 1 [drp] via OPHTHALMIC
  Filled 2021-04-10: qty 15

## 2021-04-10 MED ORDER — ONDANSETRON HCL 4 MG/2ML IJ SOLN
4.0000 mg | Freq: Four times a day (QID) | INTRAMUSCULAR | Status: DC | PRN
  Administered 2021-04-20 – 2021-04-28 (×4): 4 mg via INTRAVENOUS
  Filled 2021-04-10 (×3): qty 2

## 2021-04-10 MED ORDER — SODIUM CHLORIDE 0.9 % IV BOLUS
500.0000 mL | Freq: Once | INTRAVENOUS | Status: AC
Start: 2021-04-10 — End: 2021-04-10
  Administered 2021-04-10: 500 mL via INTRAVENOUS

## 2021-04-10 MED ORDER — ONDANSETRON HCL 4 MG PO TABS: 4.0000 mg | ORAL_TABLET | Freq: Four times a day (QID) | ORAL | Status: DC | PRN

## 2021-04-10 MED ORDER — SODIUM CHLORIDE 0.9 % IV SOLN: INTRAVENOUS | Status: DC

## 2021-04-10 MED ORDER — TOBRAMYCIN 0.3 % OP SOLN: 1.0000 [drp] | OPHTHALMIC | Status: DC | PRN

## 2021-04-10 MED ORDER — HEPARIN SODIUM (PORCINE) 5000 UNIT/ML IJ SOLN
5000.0000 [IU] | Freq: Two times a day (BID) | INTRAMUSCULAR | Status: DC
  Administered 2021-04-10 – 2021-04-27 (×33): 5000 [IU] via SUBCUTANEOUS
  Filled 2021-04-10 (×33): qty 1

## 2021-04-10 MED ORDER — CLOPIDOGREL BISULFATE 75 MG PO TABS
75.0000 mg | ORAL_TABLET | Freq: Every day | ORAL | Status: DC
  Administered 2021-04-10 – 2021-04-21 (×12): 75 mg via ORAL
  Filled 2021-04-10 (×12): qty 1

## 2021-04-10 MED ORDER — INSULIN ASPART 100 UNIT/ML IJ SOLN
0.0000 [IU] | Freq: Three times a day (TID) | INTRAMUSCULAR | Status: DC
  Administered 2021-04-11: 2 [IU] via SUBCUTANEOUS
  Administered 2021-04-11 – 2021-04-12 (×3): 1 [IU] via SUBCUTANEOUS
  Administered 2021-04-12: 2 [IU] via SUBCUTANEOUS
  Administered 2021-04-13 – 2021-04-14 (×3): 1 [IU] via SUBCUTANEOUS
  Administered 2021-04-14: 2 [IU] via SUBCUTANEOUS
  Administered 2021-04-15: 1 [IU] via SUBCUTANEOUS
  Administered 2021-04-15: 2 [IU] via SUBCUTANEOUS
  Administered 2021-04-16: 1 [IU] via SUBCUTANEOUS
  Administered 2021-04-16 – 2021-04-17 (×3): 2 [IU] via SUBCUTANEOUS
  Administered 2021-04-17: 1 [IU] via SUBCUTANEOUS
  Administered 2021-04-18: 2 [IU] via SUBCUTANEOUS
  Administered 2021-04-18: 1 [IU] via SUBCUTANEOUS
  Administered 2021-04-19 – 2021-04-21 (×4): 2 [IU] via SUBCUTANEOUS
  Administered 2021-04-21 – 2021-04-26 (×9): 1 [IU] via SUBCUTANEOUS
  Administered 2021-04-27 (×2): 2 [IU] via SUBCUTANEOUS
  Administered 2021-04-28 (×2): 1 [IU] via SUBCUTANEOUS

## 2021-04-10 MED ORDER — TAMSULOSIN HCL 0.4 MG PO CAPS
0.8000 mg | ORAL_CAPSULE | Freq: Every day | ORAL | Status: DC
  Administered 2021-04-10 – 2021-05-03 (×18): 0.8 mg via ORAL
  Filled 2021-04-10 (×21): qty 2

## 2021-04-10 MED ORDER — METOPROLOL SUCCINATE ER 25 MG PO TB24
25.0000 mg | ORAL_TABLET | Freq: Every day | ORAL | Status: DC
  Administered 2021-04-11 – 2021-04-22 (×9): 25 mg via ORAL
  Filled 2021-04-10 (×12): qty 1

## 2021-04-10 MED ORDER — SODIUM BICARBONATE 650 MG PO TABS
650.0000 mg | ORAL_TABLET | Freq: Two times a day (BID) | ORAL | Status: DC
  Administered 2021-04-10 – 2021-04-14 (×8): 650 mg via ORAL
  Filled 2021-04-10 (×8): qty 1

## 2021-04-10 NOTE — ED Notes (Signed)
No voiced complaints.  Pillow and warm blanket given

## 2021-04-10 NOTE — ED Triage Notes (Addendum)
Patient c/o weakness and fatigue for 1 week. States he couldn't make it up the stairs to go to bed and slept in the recliner. EMS was worried about ectopy on EKG.

## 2021-04-10 NOTE — ED Notes (Signed)
MD at bedside. 

## 2021-04-10 NOTE — ED Provider Notes (Signed)
Patient care assumed from [facility], PA-C at shift change.  Please see his note for complete history.  Patient with history of dilated cardiomyopathy and prostate cancer no longer under radiation presents with generalized fatigue since winter, worsened in the last 2 months.  He reports decreased activity tolerance, he is not having any chest pain or pain anywhere else.  Reports he feels weak at home and is having impaired ability to get through daily life.  Physical Exam  BP (!) 163/68   Pulse 68   Temp 97.9 F (36.6 C) (Oral)   Resp 16   Ht 6\' 1"  (1.854 m)   Wt 93.4 kg   SpO2 99%   BMI 27.18 kg/m   Physical Exam Vitals and nursing note reviewed. Exam conducted with a chaperone present.  Constitutional:      General: He is not in acute distress.    Appearance: Normal appearance.     Comments: Patient appears frail, dry  HENT:     Head: Normocephalic and atraumatic.     Mouth/Throat:     Mouth: Mucous membranes are dry.  Eyes:     General: No scleral icterus.    Extraocular Movements: Extraocular movements intact.     Pupils: Pupils are equal, round, and reactive to light.  Cardiovascular:     Rate and Rhythm: Normal rate and regular rhythm.  Pulmonary:     Effort: Pulmonary effort is normal.     Comments: No tachypnea, minor crackles at bases of the lung fields bilaterally Skin:    Coloration: Skin is not jaundiced.  Neurological:     Mental Status: He is alert. Mental status is at baseline.     Coordination: Coordination normal.   ED Course/Procedures   Clinical Course as of 04/10/21 1637  Thu Apr 10, 2021  1611 Comprehensive metabolic panel(!) New AKI, worsening over the last year.  [HS]  1612 Troponin I (High Sensitivity)(!) Minor trop bump, likely due to AKI.  [HS]    Clinical Course User Index [HS] Sherrill Raring, PA-C     Procedures  MDM  Patient has an AKI, this in the setting of weakness given his age and past medical history I think he should come  in.  Spoke with Dr. Roosevelt Locks the hospitalist and he agreed to admit the patient.       Sherrill Raring, PA-C 04/10/21 1638    Luna Fuse, MD 04/10/21 [phone]

## 2021-04-10 NOTE — ED Provider Notes (Signed)
[facility] EMERGENCY DEPARTMENT Provider Note   CSN: [id number] Arrival date & time: 04/10/21  1339     History Chief Complaint  Patient presents with   Weakness   Fatigue    Thomas Duarte is a 80 y.o. male.  HPI Patient is an 80 year old male with past medical history significant for HTN, CVA, DM 2, HLD, BPH, prostate cancer  Patient is presented to ER today with complaints of generalized weakness that started "before winter "he states that over the past year he feels that he is becoming more deconditioned he normally walks around with either a cane or a walker.  Patient states that he has been getting progressively more short of breath worse with exertion denies any chest pain fevers chills or cough.  Denies any easy bleeding or bruising.  Denies any dark or tarry stool.  Denies any gum bleeding.  He states that he feels weak throughout does not have any particular extremity that feels weak.  Denies any slurred speech or confusion no head injuries.  He states he does not feel any more short of breath when he is laying down at night. Denies any night sweats or fevers.  Denies any significant congestion.  No recent surgeries, hospitalization, long travel, hemoptysis, estrogen containing OCP, cancer history.  No unilateral leg swelling.  No history of PE or VTE.      Past Medical History:  Diagnosis Date   BPH (benign prostatic hyperplasia)    Diabetes mellitus without complication ([place])    Hypercholesteremia    Hypertension    Stroke ([place]) 2019   x2; numbness of right thumb and pointer and middle finger, no other deficits    Patient Active Problem List   Diagnosis Date Noted   Prostate cancer ([place]) 09/09/2020    Past Surgical History:  Procedure Laterality Date   CATARACT EXTRACTION W/PHACO Left 10/21/2018   Procedure: CATARACT EXTRACTION PHACO AND INTRAOCULAR LENS PLACEMENT LEFT EYE;  Surgeon: Baruch Goldmann, MD;  Location: [facility];  Service:  Ophthalmology;  Laterality: Left;  CDE: 5.18   CATARACT EXTRACTION W/PHACO Right 06/23/2019   Procedure: CATARACT EXTRACTION PHACO AND INTRAOCULAR LENS PLACEMENT (IOC);  Surgeon: Baruch Goldmann, MD;  Location: [facility];  Service: Ophthalmology;  Laterality: Right;  CDE: 15.0   EXPLORATORY LAPAROTOMY     removal of scar tissue.   GOLD SEED IMPLANT N/A 12/16/2020   Procedure: GOLD SEED IMPLANT;  Surgeon: Cleon Gustin, MD;  Location: [facility];  Service: Urology;  Laterality: N/A;  Dr. requests time 1:00   HERNIA REPAIR Left 1960   Inguinal   IR GENERIC HISTORICAL  04/28/2016   IR RADIOLOGIST EVAL & MGMT 04/28/2016 [facility]   SPACE OAR INSTILLATION N/A 12/16/2020   Procedure: SPACE OAR INSTILLATION;  Surgeon: Cleon Gustin, MD;  Location: [facility];  Service: Urology;  Laterality: N/A;   TONSILLECTOMY  1952   adenoidectomy       No family history on file.  Social History   Tobacco Use   Smoking status: Never   Smokeless tobacco: Never  Vaping Use   Vaping Use: Never used  Substance Use Topics   Alcohol use: Never   Drug use: Never    Home Medications Prior to Admission medications   Medication Sig Start Date End Date Taking? Authorizing Provider  aspirin 325 MG tablet Take 325 mg by mouth daily.    [provider]  atorvastatin (LIPITOR) 40 MG tablet Take 40 mg by mouth at  bedtime.    [provider]  clopidogrel (PLAVIX) 75 MG tablet Take 75 mg by mouth daily.    [provider]  furosemide (LASIX) 40 MG tablet Take 40 mg by mouth 2 (two) times daily. 05/13/20   [provider]  Glycerin-Hypromellose-PEG 400 0.2-0.2-1 % SOLN Place 1 drop into both eyes 3 (three) times daily as needed (dry eyes).    [provider]  KLOR-CON M10 10 MEQ tablet Take 10 mEq by mouth daily. 05/03/20   [provider]  Lancets (ONETOUCH DELICA PLUS [id number]) [place] SMARTSIG:1 Strip(s) Topical Daily 08/29/20   [provider]  lisinopril  (PRINIVIL,ZESTRIL) 40 MG tablet Take 40 mg by mouth daily.    [provider]  metFORMIN (GLUCOPHAGE) 500 MG tablet Take 500 mg by mouth daily with breakfast.    [provider]  metoprolol succinate (TOPROL-XL) 25 MG 24 hr tablet Take 25 mg by mouth daily. 05/09/20   [provider]  [facility] VERIO test strip 1 each daily. 10/03/20   [provider]  tamsulosin (FLOMAX) 0.4 MG CAPS capsule Take 0.8 mg by mouth at bedtime.     [provider]  tobramycin (TOBREX) 0.3 % ophthalmic solution SMARTSIG:In Eye(s) 06/26/20   [provider]    Allergies    Patient has no known allergies.  Review of Systems   Review of Systems  Constitutional:  Positive for fatigue. Negative for chills and fever.  HENT:  Negative for congestion.   Eyes:  Negative for pain.  Respiratory:  Positive for shortness of breath. Negative for cough.   Cardiovascular:  Negative for chest pain and leg swelling.  Gastrointestinal:  Negative for abdominal pain, diarrhea, nausea and vomiting.  Genitourinary:  Negative for dysuria.  Musculoskeletal:  Negative for myalgias.  Skin:  Negative for rash.  Neurological:  Negative for dizziness and headaches.  Psychiatric/Behavioral:  Negative for suicidal ideas.    Physical Exam Updated Vital Signs BP (!) 163/68   Pulse 68   Temp 97.9 F (36.6 C) (Oral)   Resp 16   Ht 6\' 1"  (1.854 m)   Wt 93.4 kg   SpO2 99%   BMI 27.18 kg/m   Physical Exam Vitals and nursing note reviewed.  Constitutional:      General: He is not in acute distress.    Comments: Pleasant 80 year old male does not appear to be in any acute distress.  Speaking in full sentences.  Able to answer questions appropriately follow commands.  HENT:     Head: Normocephalic and atraumatic.     Nose: Nose normal.     Mouth/Throat:     Comments: Somewhat pale oromucosa Eyes:     General: No scleral icterus.    Comments: Pale conjunctive a  Cardiovascular:      Rate and Rhythm: Normal rate and regular rhythm.     Pulses: Normal pulses.     Heart sounds: Normal heart sounds.  Pulmonary:     Effort: Pulmonary effort is normal. No respiratory distress.     Breath sounds: No wheezing.     Comments: Speaking in full sentences, respiratory rate normal, faint crackles auscultated bilateral bases Abdominal:     Palpations: Abdomen is soft.     Tenderness: There is no abdominal tenderness.  Musculoskeletal:     Cervical back: Normal range of motion.     Right lower leg: No edema.     Left lower leg: No edema.  Skin:    General: Skin  is warm and dry.     Capillary Refill: Capillary refill takes less than 2 seconds.  Neurological:     Mental Status: He is alert. Mental status is at baseline.     Comments: Moves all 4 extremities.  Strength intact in all 4 extremities.  Sensation intact in all 4 extremities  Psychiatric:        Mood and Affect: Mood normal.        Behavior: Behavior normal.    ED Results / Procedures / Treatments   Labs (all labs ordered are listed, but only abnormal results are displayed) Labs Reviewed  CBG MONITORING, ED - Abnormal; Notable for the following components:      Result Value   Glucose-Capillary 131 (*)    All other components within normal limits  CBC WITH DIFFERENTIAL/PLATELET  COMPREHENSIVE METABOLIC PANEL  LIPASE, BLOOD  URINALYSIS, ROUTINE W REFLEX MICROSCOPIC  TSH  BRAIN NATRIURETIC PEPTIDE  TROPONIN I (HIGH SENSITIVITY)    EKG None  Radiology No results found.  Procedures Procedures   Medications Ordered in ED Medications - No data to display  ED Course  I have reviewed the triage vital signs and the nursing notes.  Pertinent labs & imaging results that were available during my care of the patient were reviewed by me and considered in my medical decision making (see chart for details).     MDM Rules/Calculators/A&P                           Patient is an 80 year old male with a  past medical history significant for HTN, CVA, DM2, HLD, BPH, prostate cancer  Patient is presented to ER today with symptoms of weakness/fatigue seems that his symptoms been ongoing for approximately 2 months although he does mention that he has been more fatigued since this past winter.  Seems to have decreased exercise tolerance is not endorsing any chest pain.  Physical exam is notable for some faint crackles in bilateral bases.  Patient is speaking in full sentences and not tachypneic on my examination. He has notably pale conjunctiva and pale oromucosa all mucosa is wet.  Patient does not appear grossly fluid overloaded or hypervolemic.  Chest x-ray reviewed.  Trachea midline.  Lungs without any significant pulmonary edema.  Cardiac silhouette upper end of normal.  No other acute abnormality.  I am not able to see patient's last echocardiogram although it is noted by his cardiologist Dr. Candis Musa to be on the low end of normal EF he does have a history of dilated cardiomyopathy was last assessed by cardiology nearly exactly 2 months ago.  Has been on twice daily Lasix as well as potassium supplements.  On metoprolol.  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 Today EKG is notable for relatively frequent PVCs he has 3 on his EKG as well as what appears to be 2 PACs.  Does not seem to have a history of hypertension.  Patient care signed out to oncoming team. Discussed with Dr. Almyra Free and Sherrill Raring  Final Clinical Impression(s) / ED Diagnoses Final diagnoses:  Weakness    Rx / DC Orders ED Discharge Orders     None        Tedd Sias, [place] 04/10/21 1527    Luna Fuse, MD 04/14/21 [phone]

## 2021-04-10 NOTE — ED Notes (Signed)
Patient transported to X-ray 

## 2021-04-10 NOTE — H&P (Addendum)
History and Physical    Thomas Duarte MPN:[id number] DOB: [birth year] DOA: 04/10/2021  PCP: Monico Blitz, MD (Confirm with patient/family/NH records and if not entered, this has to be entered at [facility] point of entry) Patient coming from: Home  I have personally briefly reviewed patient's old medical records in [place]  Chief Complaint: Feeling weak  HPI: Thomas Duarte is a 80 y.o. 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  Patient recently started prostate cancer treatment and completed 4 weeks of radiation treatment 2 weeks ago.  Patient has been suffering from a urinary incontinence for few month, but last 2 weeks, his urinary symptoms had deteriorated, with frequent " leakage", sometimes without him noticing.  He started to wear diapers for last 2 weeks, and he urinary changes diapers 2-3 times a day 9 and one time at night.  For last 2 weeks, gradually experienced extremely weakness of his muscles around the pelvis and bilateral thighs.  He could only tolerate walking very short distance compared to before.  He denied any numbness or weakness of his arms or hands, no numbness of his legs were feet. Today, he could not even walk upstairs because of leg weakness.  No problem of bowel movement.  ED Course: Patient was found to have worsening of CKD creatinine 3.4 compared to baseline 2.0, bicarb 17, BUN 84.  Review of Systems: As per HPI otherwise 14 point review of systems negative.    Past Medical History:  Diagnosis Date   BPH (benign prostatic hyperplasia)    Diabetes mellitus without complication ([place])    Hypercholesteremia    Hypertension    Stroke ([place]) 2019   x2; numbness of right thumb and pointer and middle finger, no other deficits    Past Surgical History:   Procedure Laterality Date   CATARACT EXTRACTION W/PHACO Left 10/21/2018   Procedure: CATARACT EXTRACTION PHACO AND INTRAOCULAR LENS PLACEMENT LEFT EYE;  Surgeon: Baruch Goldmann, MD;  Location: [facility];  Service: Ophthalmology;  Laterality: Left;  CDE: 5.18   CATARACT EXTRACTION W/PHACO Right 06/23/2019   Procedure: CATARACT EXTRACTION PHACO AND INTRAOCULAR LENS PLACEMENT (IOC);  Surgeon: Baruch Goldmann, MD;  Location: [facility];  Service: Ophthalmology;  Laterality: Right;  CDE: 15.0   EXPLORATORY LAPAROTOMY     removal of scar tissue.   GOLD SEED IMPLANT N/A 12/16/2020   Procedure: GOLD SEED IMPLANT;  Surgeon: Cleon Gustin, MD;  Location: [facility];  Service: Urology;  Laterality: N/A;  Dr. requests time 1:00   HERNIA REPAIR Left 1960   Inguinal   IR GENERIC HISTORICAL  04/28/2016   IR RADIOLOGIST EVAL & MGMT 04/28/2016 [facility]   SPACE OAR INSTILLATION N/A 12/16/2020   Procedure: SPACE OAR INSTILLATION;  Surgeon: Cleon Gustin, MD;  Location: [facility];  Service: Urology;  Laterality: N/A;   TONSILLECTOMY  1952   adenoidectomy     reports that he has never smoked. He has never used smokeless tobacco. He reports that he does not drink alcohol and does not use drugs.  No Known Allergies  No family history on file.   Prior to Admission medications   Medication Sig Start Date End Date Taking? Authorizing Provider  aspirin 325 MG tablet Take 325 mg by mouth daily.   Yes [provider]  clopidogrel (PLAVIX) 75 MG tablet  Take 75 mg by mouth at bedtime.   Yes [provider]  furosemide (LASIX) 40 MG tablet Take 40 mg by mouth 2 (two) times daily. 05/13/20  Yes [provider]  Glycerin-Hypromellose-PEG 400 0.2-0.2-1 % SOLN Place 1 drop into both eyes 3 (three) times daily as needed (dry eyes).   Yes [provider]  ibuprofen (ADVIL) 200 MG tablet Take 400 mg by mouth at bedtime as needed for mild pain.   Yes [provider]  KLOR-CON M10 10  MEQ tablet Take 10 mEq by mouth 2 (two) times daily. 05/03/20  Yes [provider]  lisinopril (PRINIVIL,ZESTRIL) 40 MG tablet Take 40 mg by mouth daily.   Yes [provider]  metFORMIN (GLUCOPHAGE) 500 MG tablet Take 500 mg by mouth daily with breakfast.   Yes [provider]  metoprolol succinate (TOPROL-XL) 25 MG 24 hr tablet Take 25 mg by mouth daily. 05/09/20  Yes [provider]  OVER THE COUNTER MEDICATION Apply 1 application topically 2 (two) times daily as needed (pain). Real Time Pain Relief   Yes [provider]  tamsulosin (FLOMAX) 0.4 MG CAPS capsule Take 0.8 mg by mouth at bedtime.    Yes [provider]  tobramycin (TOBREX) 0.3 % ophthalmic solution Place 1 drop into both eyes every 4 (four) hours as needed (after eye injections until healed).   Yes [provider]  Lancets (ONETOUCH DELICA PLUS ATFTDD22G) [place] SMARTSIG:1 Strip(s) Topical Daily 08/29/20   [provider]  [facility] VERIO test strip 1 each daily. 10/03/20   [provider]    Physical Exam: Vitals:   04/10/21 1421 04/10/21 1505 04/10/21 1530 04/10/21 1629  BP: 137/69 (!) 163/68 (!) 146/64 (!) 167/57  Pulse: 67 68 64 63  Resp: (!) 23 16 14 17   Temp:      TempSrc:      SpO2: 100% 99% 100% 97%  Weight:      Height:        Constitutional: NAD, calm, comfortable Vitals:   04/10/21 1421 04/10/21 1505 04/10/21 1530 04/10/21 1629  BP: 137/69 (!) 163/68 (!) 146/64 (!) 167/57  Pulse: 67 68 64 63  Resp: (!) 23 16 14 17   Temp:      TempSrc:      SpO2: 100% 99% 100% 97%  Weight:      Height:       Eyes: PERRL, lids and conjunctivae normal ENMT: Mucous membranes are dry. posterior pharynx clear of any exudate or lesions.Normal dentition.  Neck: normal, supple, no masses, no thyromegaly Respiratory: clear to auscultation bilaterally, no wheezing, no crackles. Normal respiratory effort. No accessory muscle use.  Cardiovascular: Regular  rate and rhythm, no murmurs / rubs / gallops. No extremity edema. 2+ pedal pulses. No carotid bruits.  Abdomen: no tenderness, no masses palpated. No hepatosplenomegaly. Bowel sounds positive.  Musculoskeletal: no clubbing / cyanosis. No joint deformity upper and lower extremities. Good ROM, no contractures. Normal muscle tone.  Skin: no rashes, lesions, ulcers. No induration Neurologic: CN 2-12 grossly intact. Sensation intact, DTR normal. Strength 4/5 in bilateral lower extremities, muscle strength 5/5 on bilateral upper extremities.  Psychiatric: Normal judgment and insight. Alert and oriented x 3. Normal mood.     Labs on Admission: I have personally reviewed following labs and imaging studies  CBC: Recent Labs  Lab 04/10/21 1438  WBC 6.5  NEUTROABS 4.7  HGB 9.0*  HCT 27.8*  MCV 99.3  PLT 254*   Basic Metabolic  Panel: Recent Labs  Lab 04/10/21 1438 04/10/21 1535  NA 141  --   K 4.4  --   CL 118*  --   CO2 17*  --   GLUCOSE 152*  --   BUN 84*  --   CREATININE 3.46*  --   CALCIUM 9.1  --   MG  --  2.1   GFR: Estimated Creatinine Clearance: 18.9 mL/min (A) (by C-G formula based on SCr of 3.46 mg/dL (H)). Liver Function Tests: Recent Labs  Lab 04/10/21 1438  AST 13*  ALT 11  ALKPHOS 89  BILITOT 0.3  PROT 5.8*  ALBUMIN 2.9*   Recent Labs  Lab 04/10/21 1438  LIPASE 42   No results for input(s): AMMONIA in the last 168 hours. Coagulation Profile: No results for input(s): INR, PROTIME in the last 168 hours. Cardiac Enzymes: No results for input(s): CKTOTAL, CKMB, CKMBINDEX, TROPONINI in the last 168 hours. BNP (last 3 results) No results for input(s): PROBNP in the last 8760 hours. HbA1C: No results for input(s): HGBA1C in the last 72 hours. CBG: Recent Labs  Lab 04/10/21 1343  GLUCAP 131*   Lipid Profile: No results for input(s): CHOL, HDL, LDLCALC, TRIG, CHOLHDL, LDLDIRECT in the last 72 hours. Thyroid Function Tests: Recent Labs     04/10/21 1438  TSH 0.974   Anemia Panel: No results for input(s): VITAMINB12, FOLATE, FERRITIN, TIBC, IRON, RETICCTPCT in the last 72 hours. Urine analysis:    Component Value Date/Time   APPEARANCEUR Clear 01/01/2021 1448   GLUCOSEU Negative 01/01/2021 1448   BILIRUBINUR Negative 01/01/2021 1448   PROTEINUR Negative 01/01/2021 1448   NITRITE Negative 01/01/2021 1448   LEUKOCYTESUR Negative 01/01/2021 1448    Radiological Exams on Admission: DG Chest 2 View  Result Date: 04/10/2021 CLINICAL DATA:  Shortness of breath and weakness over the last week. EXAM: CHEST - 2 VIEW COMPARISON:  None. FINDINGS: Heart size upper limits of normal. Aortic atherosclerosis and tortuosity. The lungs are clear. The vascularity is normal. No evidence of heart failure. No effusion. No significant bone finding. IMPRESSION: No active disease. Heart size upper limits of normal. Aortic atherosclerosis. Electronically Signed   By: Nelson Chimes M.D.   On: 04/10/2021 15:12    EKG: Independently reviewed.  Sinus rhythm with frequent PVCs  Assessment/Plan Active Problems:   AKI (acute kidney injury) ([place])  (please populate well all problems here in Problem List. (For example, if patient is on BP meds at home and you resume or decide to hold them, it is a problem that needs to be her. Same for CAD, COPD, HLD and so on)  AKI on CKD stage III -Appears to be hypovolemic -Suspect overdiuresis, hold Lasix, start IV fluid, recheck BMP tomorrow. -Rule out urinary obstruction, ordered kidney ultrasound -Discontinue as needed NSAIDs and metformin and ACEI. -Better control of blood pressure, as needed hydralazine. -UA pending -Start bicarb pills  Generalized weakness -Neuro exam nonfocal, not considering lumbar stenosis, patient had a rather normal bone scan February 2022 -Check CK level -PT evaluation -Consider lumbar MRI if other work-up cannot explain weakness.  Urinary incontinence -UA pending -Appears to  have overflow incontinence, might related to prostate cancer/radiation treatment.  Elevated troponins -Denies any chest pain -No symptoms signs of CHF decompensation, suspect troponin elevation secondary to AKI -Check CK level  Chronic diastolic CHF -Probably overdiuresis, hold Lasix -Gentle hydration x12 hours then reevaluate -Blood pressure significantly elevated, will need better BP control. -Continue beta-blocker, add as needed hydralazine  Frequent PVCs -Asymptomatic, blood pressure stable -Continue beta-blocker, continue to follow-up with cardiology at [facility]  Chronic normocytic anemia -Denied any GI symptoms, or black tarry stool -Check iron level and reticulocyte count.  IIDM -Discontinue metformin -Sliding scale for now  Prostate CA -Follow-up urology outpatient for IMRT therapy  DVT prophylaxis: Heparin subcu Code Status: Full code Family Communication: Left wife message Disposition Plan: Expect more than 2 midnight hospital stay to treat AKI Consults called: None Admission status: PCU   Lequita Halt MD [facility] Pager [phone]  04/10/2021, 5:24 PM

## 2021-04-11 ENCOUNTER — Inpatient Hospital Stay (HOSPITAL_COMMUNITY): Payer: Medicare HMO

## 2021-04-11 LAB — URINALYSIS, COMPLETE (UACMP) WITH MICROSCOPIC
Bacteria, UA: NONE SEEN
Bilirubin Urine: NEGATIVE
Glucose, UA: NEGATIVE mg/dL
Hgb urine dipstick: NEGATIVE
Ketones, ur: NEGATIVE mg/dL
Leukocytes,Ua: NEGATIVE
Nitrite: NEGATIVE
Protein, ur: NEGATIVE mg/dL
Specific Gravity, Urine: 1.01 (ref 1.005–1.030)
pH: 6 (ref 5.0–8.0)

## 2021-04-11 LAB — BLOOD GAS, VENOUS
Acid-base deficit: 10.3 mmol/L — ABNORMAL HIGH (ref 0.0–2.0)
Bicarbonate: 14.9 mmol/L — ABNORMAL LOW (ref 20.0–28.0)
Drawn by: 1461
FIO2: 21
O2 Saturation: 36.7 %
Patient temperature: 37
pCO2, Ven: 31.5 mmHg — ABNORMAL LOW (ref 44.0–60.0)
pH, Ven: 7.297 (ref 7.250–7.430)
pO2, Ven: <31 mmHg — CL (ref 32.0–45.0)

## 2021-04-11 LAB — CBC
HCT: 28.2 % — ABNORMAL LOW (ref 39.0–52.0)
Hemoglobin: 9.1 g/dL — ABNORMAL LOW (ref 13.0–17.0)
MCH: 31.5 pg (ref 26.0–34.0)
MCHC: 32.3 g/dL (ref 30.0–36.0)
MCV: 97.6 fL (ref 80.0–100.0)
Platelets: 142 10*3/uL — ABNORMAL LOW (ref 150–400)
RBC: 2.89 MIL/uL — ABNORMAL LOW (ref 4.22–5.81)
RDW: 13.3 % (ref 11.5–15.5)
WBC: 7.1 10*3/uL (ref 4.0–10.5)
nRBC: 0 % (ref 0.0–0.2)

## 2021-04-11 LAB — BASIC METABOLIC PANEL
Anion gap: 10 (ref 5–15)
BUN: 77 mg/dL — ABNORMAL HIGH (ref 8–23)
CO2: 16 mmol/L — ABNORMAL LOW (ref 22–32)
Calcium: 9.2 mg/dL (ref 8.9–10.3)
Chloride: 116 mmol/L — ABNORMAL HIGH (ref 98–111)
Creatinine, Ser: 3.08 mg/dL — ABNORMAL HIGH (ref 0.61–1.24)
GFR, Estimated: 20 mL/min — ABNORMAL LOW (ref >60)
Glucose, Bld: 147 mg/dL — ABNORMAL HIGH (ref 70–99)
Potassium: 4.5 mmol/L (ref 3.5–5.1)
Sodium: 142 mmol/L (ref 135–145)

## 2021-04-11 LAB — GLUCOSE, CAPILLARY
Glucose-Capillary: 136 mg/dL — ABNORMAL HIGH (ref 70–99)
Glucose-Capillary: 166 mg/dL — ABNORMAL HIGH (ref 70–99)
Glucose-Capillary: 150 mg/dL — ABNORMAL HIGH (ref 70–99)
Glucose-Capillary: 147 mg/dL — ABNORMAL HIGH (ref 70–99)

## 2021-04-11 LAB — SODIUM, URINE, RANDOM: Sodium, Ur: 50 mmol/L

## 2021-04-11 MED ORDER — TOBRAMYCIN 0.3 % OP SOLN
1.0000 [drp] | OPHTHALMIC | Status: DC | PRN
  Administered 2021-04-12 – 2021-04-16 (×2): 1 [drp] via OPHTHALMIC
  Filled 2021-04-11: qty 5

## 2021-04-11 MED ORDER — SODIUM CHLORIDE 0.9 % IV SOLN: INTRAVENOUS | Status: DC

## 2021-04-11 NOTE — Progress Notes (Signed)
PT Cancellation Note  Patient Details Name: ADELFO DIEBEL MRN: [id number] DOB: February 02, 1941   Cancelled Treatment:    Reason Eval/Treat Not Completed: Patient at procedure or test/unavailable (MRI). Will follow-up for PT Evaluation as schedule permits.  Mabeline Caras, PT, DPT Acute Rehabilitation Services  Pager [phone] Office [place] 04/11/2021, 3:37 PM

## 2021-04-11 NOTE — Progress Notes (Signed)
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  He recently had prostate cancer treatment and completed 4 weeks of radiation to weeks ago.  He also is complaining of urinary incontinence which has progressively worsened.  Recently started wearing diapers.  Last 2 weeks, experience extreme weakness of the muscles around the pelvis and bilateral thighs.  He has recently been unable to walk because of bilateral lower extremity weakness.  On presentation.,he  was found to have worsening of his renal function with creatinine in the range of 3.  Baseline creatinine around 2.  Patient admitted for further work-up.    Assessment & Plan:   Active Problems:   AKI (acute kidney injury) ([place])   AKI on CKD stage IIIb: Baseline creatinine around 4.  On presentation appeared hypovolemic.  Takes Lasix at home, suspected overdiuresis. Renal ultrasound did not show any obstruction or hydronephrosis. Continue gentle IV fluids.  Kidney function gradually improving.  We will check urine sodium  Generalized weakness/bilateral lower extremity weakness, ambulatory dysfunction: He was bilateral lower extremity weakness.  PT/OT evaluation requested.  We will order MRI of the lumbar spine on the background of prostate cancer. He lives with his wife.  Uses walker/cane for ambulation.  Urinary incontinence: We will collect UA.  Likely associated with prostate cancer/radiation treatment.  We recommend follow-up with urology as an outpatient.  Elevated troponin: Denies any chest pain.  Likely secondary to supply  demand ischemia from severe AKI.  Chronic diastolic congestive heart failure: Takes Lasix at home.  Probably overt diuresed at this point.  Lasix held.  Started on IV fluids.  Hypertension: Presented with hypotension.  Continue beta-blocker, as needed hydralazine.  Monitor blood pressure.  Currently stable  History of frequent PVCs: Beta-blocker to be continued .  He follows with cardiology at [facility]  Chronic normocytic anemia: Currently hemoglobin is stable.  Diabetes type 2: Continue sliding scale for now.  Takes metformin at home.  Hemoglobin A1c of 7.1  History of prostate cancer: Follows with urology.  Recently completed radiation therapy.           DVT prophylaxis:Heparin [place] Code Status: Full Family Communication: Called wife on phone for update, call not received Status is: Inpatient  Remains inpatient appropriate because:Inpatient level of care appropriate due to severity of illness  Dispo: The patient is from: Home              Anticipated d/c is to: Home              Patient currently is not medically stable to d/c.   Difficult to place patient No     Consultants: None  Procedures: None  Antimicrobials:  Anti-infectives (From admission, onward)    None       Subjective:  Patient seen and examined at the bedside this morning.  Hemodynamically stable.  Overall comfortable.  Lying on the bed.  Complains of bilateral lower extremity weakness but denies any back pain.  Looks pleasant,denied any new complains  Objective: Vitals:   04/10/21 2115 04/10/21 2350 04/11/21 0401 04/11/21 0757  BP: (!) 188/80 (!) 180/64 (!) 174/48 (!) 157/93  Pulse: (!) 59  73 (!) 42 66  Resp:  20 20   Temp: 98 F (36.7 C) (!) 97.5 F (36.4 C) 98.3 F (36.8 C) 97.9 F (36.6 C)  TempSrc: Oral Oral Oral Oral  SpO2: 100% 97% 100% (!) 87%  Weight: 94.9 kg     Height: 6\' 1"  (1.854 m)       Intake/Output Summary (Last 24 hours) at 04/11/2021 0981 Last data filed at  04/10/2021 1737 Gross per 24 hour  Intake 500 ml  Output --  Net 500 ml   Filed Weights   04/10/21 1344 04/10/21 2115  Weight: 93.4 kg 94.9 kg    Examination:  General exam: Overall comfortable, not in distress, pleasant elderly male, very deconditioned, chronically ill looking HEENT: PERRL Respiratory system:  no wheezes or crackles  Cardiovascular system: S1 & S2 heard, RRR.  Gastrointestinal system: Abdomen is nondistended, soft and nontender. Central nervous system: Alert and oriented.  Weakness of bilateral lower extremities with motor of 4/5 on both Extremities: No edema, no clubbing ,no cyanosis Skin: No rashes, no ulcers,no icterus       Data Reviewed: I have personally reviewed following labs and imaging studies  CBC: Recent Labs  Lab 04/10/21 1438 04/10/21 1811 04/11/21 0336  WBC 6.5  --  7.1  NEUTROABS 4.7  --   --   HGB 9.0* 8.8* 9.1*  HCT 27.8* 26.0* 28.2*  MCV 99.3  --  97.6  PLT 144*  --  191*   Basic Metabolic Panel: Recent Labs  Lab 04/10/21 1438 04/10/21 1535 04/10/21 1811 04/11/21 0336  NA 141  --  146* 142  K 4.4  --  4.4 4.5  CL 118*  --   --  116*  CO2 17*  --   --  16*  GLUCOSE 152*  --   --  147*  BUN 84*  --   --  77*  CREATININE 3.46*  --   --  3.08*  CALCIUM 9.1  --   --  9.2  MG  --  2.1  --   --    GFR: Estimated Creatinine Clearance: 21.3 mL/min (A) (by C-G formula based on SCr of 3.08 mg/dL (H)). Liver Function Tests: Recent Labs  Lab 04/10/21 1438  AST 13*  ALT 11  ALKPHOS 89  BILITOT 0.3  PROT 5.8*  ALBUMIN 2.9*   Recent Labs  Lab 04/10/21 1438  LIPASE 42   No results for input(s): AMMONIA in the last 168 hours. Coagulation Profile: No results for input(s): INR, PROTIME in the last 168 hours. Cardiac Enzymes: Recent Labs  Lab 04/10/21 1438  CKTOTAL 62   BNP (last 3 results) No results for input(s): PROBNP in the last 8760 hours. HbA1C: Recent Labs    04/10/21 1438  HGBA1C 7.1*   CBG: Recent  Labs  Lab 04/10/21 1343 04/10/21 2239 04/11/21 0639  GLUCAP 131* 134* 136*   Lipid Profile: No results for input(s): CHOL, HDL, LDLCALC, TRIG, CHOLHDL, LDLDIRECT in the last 72 hours. Thyroid Function Tests: Recent Labs    04/10/21 1438  TSH 0.974   Anemia Panel: Recent Labs    04/10/21 1438  TIBC 217*  IRON 51  RETICCTPCT 1.4   Sepsis Labs: No results for input(s): PROCALCITON, LATICACIDVEN in the last 168 hours.  Recent Results (from the past 240 hour(s))  Resp Panel by RT-PCR (Flu A&B, Covid) Nasopharyngeal Swab     Status: None   Collection Time: 04/10/21  4:26 PM   Specimen: Nasopharyngeal  Swab; Nasopharyngeal(NP) swabs in vial transport medium  Result Value Ref Range Status   SARS Coronavirus 2 by RT PCR NEGATIVE NEGATIVE Final    Comment: (NOTE) SARS-CoV-2 target nucleic acids are NOT DETECTED.  The SARS-CoV-2 RNA is generally detectable in upper respiratory specimens during the acute phase of infection. The lowest concentration of SARS-CoV-2 viral copies this assay can detect is 138 copies/mL. A negative result does not preclude SARS-Cov-2 infection and should not be used as the sole basis for treatment or other patient management decisions. A negative result may occur with  improper specimen collection/handling, submission of specimen other than nasopharyngeal swab, presence of viral mutation(s) within the areas targeted by this assay, and inadequate number of viral copies(<138 copies/mL). A negative result must be combined with clinical observations, patient history, and epidemiological information. The expected result is Negative.  Fact Sheet for Patients:  EntrepreneurPulse.com.au  Fact Sheet for Healthcare Providers:  IncredibleEmployment.be  This test is no t yet approved or cleared by the [place] FDA and  has been authorized for detection and/or diagnosis of SARS-CoV-2 by FDA under an Emergency Use  Authorization (EUA). This EUA will remain  in effect (meaning this test can be used) for the duration of the COVID-19 declaration under Section 564(b)(1) of the Act, 21 U.S.C.section 360bbb-3(b)(1), unless the authorization is terminated  or revoked sooner.       Influenza A by PCR NEGATIVE NEGATIVE Final   Influenza B by PCR NEGATIVE NEGATIVE Final    Comment: (NOTE) The Xpert Xpress SARS-CoV-2/FLU/RSV plus assay is intended as an aid in the diagnosis of influenza from Nasopharyngeal swab specimens and should not be used as a sole basis for treatment. Nasal washings and aspirates are unacceptable for Xpert Xpress SARS-CoV-2/FLU/RSV testing.  Fact Sheet for Patients: EntrepreneurPulse.com.au  Fact Sheet for Healthcare Providers: IncredibleEmployment.be  This test is not yet approved or cleared by the [place] FDA and has been authorized for detection and/or diagnosis of SARS-CoV-2 by FDA under an Emergency Use Authorization (EUA). This EUA will remain in effect (meaning this test can be used) for the duration of the COVID-19 declaration under Section 564(b)(1) of the Act, 21 U.S.C. section 360bbb-3(b)(1), unless the authorization is terminated or revoked.  Performed at [facility], [place] [address], [place], [place] [address]          Radiology Studies: DG Chest 2 View  Result Date: 04/10/2021 CLINICAL DATA:  Shortness of breath and weakness over the last week. EXAM: CHEST - 2 VIEW COMPARISON:  None. FINDINGS: Heart size upper limits of normal. Aortic atherosclerosis and tortuosity. The lungs are clear. The vascularity is normal. No evidence of heart failure. No effusion. No significant bone finding. IMPRESSION: No active disease. Heart size upper limits of normal. Aortic atherosclerosis. Electronically Signed   By: Nelson Chimes M.D.   On: 04/10/2021 15:12   US RENAL  Result Date: 04/10/2021 CLINICAL DATA:  Acute renal  insufficiency EXAM: RENAL / URINARY TRACT ULTRASOUND COMPLETE COMPARISON:  None. FINDINGS: Right Kidney: Renal measurements: 10.9 x 5.3 x 4.5 cm = volume: 135.1 mL. Echogenicity within normal limits. No mass or hydronephrosis visualized. Left Kidney: Renal measurements: 10.8 x 5.9 x 4.1 cm = volume: 137.9 mL. Echogenicity within normal limits. No mass or hydronephrosis visualized. Bladder: Appears normal for degree of bladder distention. Other: None. IMPRESSION: 1. Unremarkable renal ultrasound. Electronically Signed   By: Randa Ngo M.D.   On: 04/10/2021 18:18  Scheduled Meds:  aspirin  325 mg Oral Daily   clopidogrel  75 mg Oral QHS   heparin  5,000 Units Subcutaneous Q12H   insulin aspart  0-9 Units Subcutaneous TID WC   metoprolol succinate  25 mg Oral Daily   sodium bicarbonate  650 mg Oral BID   tamsulosin  0.8 mg Oral QHS   Continuous Infusions:   LOS: 1 day    Time spent: 35 mins.More than 50% of that time was spent in counseling and/or coordination of care.      Shelly Coss, MD [facility] P8/26/2022, 8:06 AM

## 2021-04-11 NOTE — Progress Notes (Signed)
Redness noted to sacrum, foam dressing applied.

## 2021-04-11 NOTE — Evaluation (Signed)
Occupational Therapy Evaluation Patient Details Name: Thomas Duarte MRN: [id number] DOB: 1941-05-21 Today's Date: 04/11/2021    History of Present Illness Pt is an 80 y.o. male admitted 04/10/21 with c/o worsening BLE weakness, urinary incontinence; of note, pt recently started prostate CA treatment (completed 4 wks of radiation 2 wks prior). Workup for AKI on CKD 3. 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   Clinical Impression   PTA Patient reports completing ADLs with independence (although difficult) and using RW for limited mobility most recently (typically using cane).  Admitted for above and limited by problem list below, including weakness, impaired balance, decreased activity tolerance.  He is disoriented to place, follows simple commands with increased time and presents with decreased awareness.  He is a questionable historian, as he reports "Its hard to remember" when asked about home setup. He requires mod assist to stand from recliner (min assist +2 from elevated EOB), up to total assist for ADLs (LB and +2 for toileting due to incontinence). Pt unclear with amount of assistance available at home, and believe he will benefit from further OT services acutely and after dc at SNF level to optimize independence and safety with ADLs, mobility prior to dc home.     Follow Up Recommendations  SNF;Supervision/Assistance - 24 hour    Equipment Recommendations  Other (comment) (TBD)    Recommendations for Other Services PT consult     Precautions / Restrictions Precautions Precautions: Fall Restrictions Weight Bearing Restrictions: No      Mobility Bed Mobility Overal bed mobility: Needs Assistance Bed Mobility: Rolling;Sidelying to Sit Rolling: Mod assist Sidelying to sit: Mod assist;HOB elevated       General bed mobility comments: support for BLEs with rolling and towards EOB, min support to elevate trunk     Transfers Overall transfer level: Needs assistance Equipment used: Rolling walker (2 wheeled) Transfers: Sit to/from [facility] Sit to Stand: Min assist;Mod assist;+2 physical assistance;+2 safety/equipment;From elevated surface Stand pivot transfers: Min assist;+2 physical assistance;+2 safety/equipment       General transfer comment: min assist +2 from elevated EOB, mod assist from recliner given cueing for hand placement, forward translation and increased time    Balance Overall balance assessment: Needs assistance Sitting-balance support: No upper extremity supported;Feet supported Sitting balance-Leahy Scale: Fair Sitting balance - Comments: limited dynaimcally during LB ADLs requiring min assist   Standing balance support: Bilateral upper extremity supported;During functional activity Standing balance-Leahy Scale: Poor Standing balance comment: relies on BUE and external support                           ADL either performed or assessed with clinical judgement   ADL Overall ADL's : Needs assistance/impaired     Grooming: Set up;Sitting           Upper Body Dressing : Minimal assistance;Sitting   Lower Body Dressing: Total assistance;+2 for physical assistance;Sit to/from stand Lower Body Dressing Details (indicate cue type and reason): requires assist for socks, relies on BUE support in standing Toilet Transfer: Moderate assistance;+2 for physical assistance;+2 for safety/equipment;RW;Stand-pivot Toilet Transfer Details (indicate cue type and reason): simulated to recliner Toileting- Clothing Manipulation and Hygiene: Total assistance;+2 for physical assistance;+2 for safety/equipment;Sit to/from stand Toileting - Clothing Manipulation Details (indicate cue type and reason): incontient of bowel and bladder, +2 for hygiene and standing     Functional mobility during ADLs: Moderate assistance;+2  for physical assistance;+2 for  safety/equipment;Rolling walker General ADL Comments: pt limited by cognition, activity tolerance, weakness, and impaired balance     Vision         Perception     Praxis      Pertinent Vitals/Pain Pain Assessment: No/denies pain     Hand Dominance Right   Extremity/Trunk Assessment Upper Extremity Assessment Upper Extremity Assessment: Generalized weakness   Lower Extremity Assessment Lower Extremity Assessment: Defer to PT evaluation   Cervical / Trunk Assessment Cervical / Trunk Assessment: Kyphotic   Communication Communication Communication: No difficulties   Cognition Arousal/Alertness: Awake/alert Behavior During Therapy: WFL for tasks assessed/performed Overall Cognitive Status: No family/caregiver present to determine baseline cognitive functioning Area of Impairment: Orientation;Problem solving;Awareness;Following commands;Safety/judgement                 Orientation Level: Disoriented to;Place     Following Commands: Follows one step commands consistently;Follows one step commands with increased time;Follows multi-step commands inconsistently Safety/Judgement: Decreased awareness of safety;Decreased awareness of deficits Awareness: Intellectual Problem Solving: Slow processing;Decreased initiation;Difficulty sequencing;Requires verbal cues General Comments: pt disoriented to place, follows simple commands with increased time.  Slow processing and poor recall of home setup (questionable historian)   General Comments  VSS on RA    Exercises     Shoulder Instructions      Home Living Family/patient expects to be discharged to:: Private residence Living Arrangements: Spouse/significant other (great grandson) Available Help at Discharge: Family Type of Home: House Home Access: Stairs to enter (cant remember how many)     Home Layout: Two level     Bathroom Shower/Tub: Teacher, early years/pre: [place]:  Environmental consultant - 2 wheels;Cane - single point   Additional Comments: pt questionable historian, reports spouse is busy with 53 y/o and doesn't have much time      Prior Functioning/Environment Level of Independence: Needs assistance  Gait / Transfers Assistance Needed: reports using cane for mobility up until about 2 weeks ago needing to use RW ADL's / Homemaking Assistance Needed: independent ADLS but reports "its been a challenge"            OT Problem List: Decreased strength;Decreased activity tolerance;Impaired balance (sitting and/or standing);Decreased coordination;Decreased cognition;Decreased safety awareness;Decreased knowledge of use of DME or AE;Decreased knowledge of precautions      OT Treatment/Interventions: Self-care/ADL training;Therapeutic exercise;Energy conservation;Therapeutic activities;Patient/family education;Balance training;Cognitive remediation/compensation;DME and/or AE instruction    OT Goals(Current goals can be found in the care plan section) Acute Rehab OT Goals Patient Stated Goal: to get better OT Goal Formulation: With patient Time For Goal Achievement: 04/25/21 Potential to Achieve Goals: Good  OT Frequency: Min 2X/week   Barriers to D/C:            Co-evaluation              AM-PAC OT "6 Clicks" Daily Activity     Outcome Measure Help from another person eating meals?: A Little Help from another person taking care of personal grooming?: A Little Help from another person toileting, which includes using toliet, bedpan, or urinal?: Total Help from another person bathing (including washing, rinsing, drying)?: A Lot Help from another person to put on and taking off regular upper body clothing?: A Little Help from another person to put on and taking off regular lower body clothing?: Total 6 Click Score: 13   End of Session Equipment Utilized During Treatment: Gait belt;Rolling walker Nurse Communication: Mobility status  Activity Tolerance:  Patient tolerated treatment well Patient left: in chair;with call bell/phone within reach;with nursing/sitter in room  OT Visit Diagnosis: Other abnormalities of gait and mobility (R26.89);Muscle weakness (generalized) (M62.81)                Time: 3748-2707 OT Time Calculation (min): 39 min Charges:  OT General Charges $OT Visit: 1 Visit OT Evaluation $OT Eval Moderate Complexity: 1 Mod OT Treatments $Self Care/Home Management : 23-37 mins  Jolaine Artist, OT Acute Rehabilitation Services Pager [phone] Office [phone]   Delight Stare 04/11/2021, 1:47 PM

## 2021-04-12 LAB — GLUCOSE, CAPILLARY
Glucose-Capillary: 121 mg/dL — ABNORMAL HIGH (ref 70–99)
Glucose-Capillary: 180 mg/dL — ABNORMAL HIGH (ref 70–99)
Glucose-Capillary: 139 mg/dL — ABNORMAL HIGH (ref 70–99)
Glucose-Capillary: 142 mg/dL — ABNORMAL HIGH (ref 70–99)

## 2021-04-12 LAB — BASIC METABOLIC PANEL
Anion gap: 7 (ref 5–15)
BUN: 69 mg/dL — ABNORMAL HIGH (ref 8–23)
CO2: 16 mmol/L — ABNORMAL LOW (ref 22–32)
Calcium: 8.7 mg/dL — ABNORMAL LOW (ref 8.9–10.3)
Chloride: 116 mmol/L — ABNORMAL HIGH (ref 98–111)
Creatinine, Ser: 2.59 mg/dL — ABNORMAL HIGH (ref 0.61–1.24)
GFR, Estimated: 24 mL/min — ABNORMAL LOW (ref >60)
Glucose, Bld: 125 mg/dL — ABNORMAL HIGH (ref 70–99)
Potassium: 4 mmol/L (ref 3.5–5.1)
Sodium: 139 mmol/L (ref 135–145)

## 2021-04-12 NOTE — Evaluation (Signed)
Physical Therapy Evaluation Patient Details Name: Thomas Duarte MRN: [id number] DOB: March 15, 1941 Today's Date: 04/12/2021   History of Present Illness  Pt is an 80 y.o. male admitted 04/10/21 with c/o worsening BLE weakness, urinary incontinence; of note, pt recently started prostate CA treatment (completed 4 wks of radiation 2 wks prior). Workup for AKI on CKD 3. 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  Clinical Impression  Pt presents to PT with deficits in strength, power, gait, balance, activity tolerance. Pt reports generalized weakness and LLE pain which limits activity tolerance. Pt expresses concern about managing flight of steps up to bathroom at home. Pt will benefit from aggressive mobilization to improve activity tolerance and progress to stair training. PT recommends SNF placement to improve endurance and reduce falls risk.    Follow Up Recommendations SNF;Supervision for mobility/OOB    Equipment Recommendations  None recommended by PT    Recommendations for Other Services       Precautions / Restrictions Precautions Precautions: Fall Restrictions Weight Bearing Restrictions: No      Mobility  Bed Mobility Overal bed mobility: Needs Assistance Bed Mobility: Supine to Sit     Supine to sit: Supervision;HOB elevated     General bed mobility comments: dependent on use of railings    Transfers Overall transfer level: Needs assistance Equipment used: Rolling walker (2 wheeled) Transfers: Sit to/from Stand Sit to Stand: From elevated surface Stand pivot transfers: Min guard       General transfer comment: pt require elevated bed to stand successfully initially. Able to stand from lower recliner with use of arm rests  Ambulation/Gait Ambulation/Gait assistance: Min guard Gait Distance (Feet): 80 Feet Assistive device: Rolling walker (2 wheeled) Gait Pattern/deviations: Step-to pattern Gait  velocity: reduced Gait velocity interpretation: <1.8 ft/sec, indicate of risk for recurrent falls General Gait Details: pt with slowed step-to gait, reduced foot clearance bilaterally  Stairs            Wheelchair Mobility    Modified Rankin (Stroke Patients Only)       Balance Overall balance assessment: Needs assistance Sitting-balance support: No upper extremity supported;Feet supported Sitting balance-Leahy Scale: Good     Standing balance support: No upper extremity supported Standing balance-Leahy Scale: Fair Standing balance comment: dons mask in standing without UE support                             Pertinent Vitals/Pain Pain Assessment: Faces Faces Pain Scale: Hurts little more Pain Location: LLE Pain Descriptors / Indicators: Grimacing Pain Intervention(s): Monitored during session    Home Living Family/patient expects to be discharged to:: Private residence Living Arrangements: Spouse/significant other (great grandson) Available Help at Discharge: Family Type of Home: House Home Access: Level entry     Home Layout: Two level Home Equipment: Environmental consultant - 2 wheels;Cane - single point      Prior Function Level of Independence: Needs assistance   Gait / Transfers Assistance Needed: pt reports ambulating without device in the home, holding onto furniture/walls. Pt reportsw utilizing walker when ambulating outdoors  ADL's / Homemaking Assistance Needed: independent ADLS but reports "its been a challenge"        Hand Dominance   Dominant Hand: Right    Extremity/Trunk Assessment   Upper Extremity Assessment Upper Extremity Assessment: Generalized weakness    Lower Extremity Assessment Lower Extremity Assessment: Generalized weakness    Cervical /  Trunk Assessment Cervical / Trunk Assessment: Kyphotic  Communication   Communication: HOH  Cognition Arousal/Alertness: Awake/alert Behavior During Therapy: WFL for tasks  assessed/performed Overall Cognitive Status: No family/caregiver present to determine baseline cognitive functioning Area of Impairment: Problem solving                             Problem Solving: Slow processing        General Comments General comments (skin integrity, edema, etc.): VSS on RA    Exercises     Assessment/Plan    PT Assessment Patient needs continued PT services  PT Problem List Decreased strength;Decreased activity tolerance;Decreased balance;Decreased mobility;Decreased cognition;Pain       PT Treatment Interventions DME instruction;Gait training;Stair training;Functional mobility training;Therapeutic exercise;Therapeutic activities;Balance training;Neuromuscular re-education;Patient/family education    PT Goals (Current goals can be found in the Care Plan section)  Acute Rehab PT Goals Patient Stated Goal: to improve strength and return to independence PT Goal Formulation: With patient Time For Goal Achievement: 04/26/21 Potential to Achieve Goals: Good    Frequency Min 3X/week (possible progression toward D/C home)   Barriers to discharge        Co-evaluation               AM-PAC PT "6 Clicks" Mobility  Outcome Measure Help needed turning from your back to your side while in a flat bed without using bedrails?: A Little Help needed moving from lying on your back to sitting on the side of a flat bed without using bedrails?: A Little Help needed moving to and from a bed to a chair (including a wheelchair)?: A Little Help needed standing up from a chair using your arms (e.g., wheelchair or bedside chair)?: A Little Help needed to walk in hospital room?: A Little Help needed climbing 3-5 steps with a railing? : A Lot 6 Click Score: 17    End of Session   Activity Tolerance: Patient tolerated treatment well Patient left: in chair;with call bell/phone within reach;with chair alarm set Nurse Communication: Mobility status PT Visit  Diagnosis: Other abnormalities of gait and mobility (R26.89);Muscle weakness (generalized) (M62.81)    Time: 7124-5809 PT Time Calculation (min) (ACUTE ONLY): 36 min   Charges:   PT Evaluation $PT Eval Low Complexity: 1 Low PT Treatments $Gait Training: 8-22 mins        Zenaida Niece, PT, DPT Acute Rehabilitation Pager: [phone]   Zenaida Niece 04/12/2021, 10:28 AM

## 2021-04-12 NOTE — Plan of Care (Signed)

## 2021-04-12 NOTE — Progress Notes (Signed)
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  He recently had prostate cancer treatment and completed 4 weeks of radiation to weeks ago.  He also is complaining of urinary incontinence which has progressively worsened.  Recently started wearing diapers.  Last 2 weeks, experience extreme weakness of the muscles around the pelvis and bilateral thighs.  He has recently been unable to walk because of bilateral lower extremity weakness.  On presentation.,he  was found to have worsening of his renal function with creatinine in the range of 3.  Baseline creatinine around 2.  Patient admitted for further work-up.  Lumbar MRI showed degenerative disease.  PT/OT recommended skilled nursing facility on discharge.  Medically stable for discharge as soon as possible    Assessment & Plan:   Active Problems:   AKI (acute kidney injury) ([place])   AKI on CKD stage IIIb: Baseline creatinine around 2.  On presentation appeared hypovolemic.  Takes Lasix at home, suspected overdiuresis. Renal ultrasound did not show any obstruction or hydronephrosis. Continue gentle IV fluids.  Kidney function gradually improving.  urine sodium of 50  Generalized weakness/bilateral lower extremity weakness, ambulatory dysfunction: He was bilateral lower extremity weakness.  PT/OT evaluation requested,recommended SNF.  He lives with his wife.  Uses walker/cane for ambulation. 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  We recommend neurosurgery follow-up as an outpatient.Denies any backpain  Urinary incontinence: UA not so stable for UTI denies dysuria..  Likely associated with prostate cancer/radiation treatment.  We recommend follow-up with urology as an outpatient.  Elevated troponin: Denies any chest pain.  Likely secondary to supply demand ischemia from severe AKI.  Chronic diastolic congestive heart failure: Takes Lasix at home.  Probably overt diuresed at this point.  Lasix held.  Started on IV fluids.  Hypertension: Presented with hypertension.  Continue beta-blocker, as needed hydralazine.  Monitor blood pressure.  Currently stable  History of frequent PVCs: Beta-blocker to be continued .  He follows with cardiology at [facility]  Chronic normocytic anemia: Currently hemoglobin is stable.  Diabetes type 2: Continue sliding scale for now.  Takes metformin at home.  Hemoglobin A1c of 7.1  History of prostate cancer: Follows with urology.  Recently completed radiation therapy.           DVT prophylaxis:Heparin [place] Code Status: Full Family Communication: Called wife on phone for update several times, call not received Status is: Inpatient  Remains inpatient appropriate because:Inpatient level of care appropriate due to severity of illness  Dispo: The patient is from: Home              Anticipated d/c is to: Skilled nursing facility              Patient currently is medically stable for discharge   Difficult to place patient No     Consultants: None  Procedures: None  Antimicrobials:  Anti-infectives (From admission, onward)    None       Subjective:  Patient seen  and examined at the bedside this morning.  He was hemodynamically stable.  He just walked with physical therapy.  He looks great, he was sitting on the chair.  Denies any back pain.  Lower  extremity weakness has improved.  He endorsed about his interest on going to a skilled nursing facility.    Objective: Vitals:   04/12/21 0001 04/12/21 0009 04/12/21 0345 04/12/21 0731  BP: (!) 157/74  (!) 153/72 (!) 158/72  Pulse: 64  66 61  Resp: 19  18 17   Temp: 97.9 F (36.6 C)  98 F (36.7 C) 98.2 F (36.8 C)  TempSrc: Oral  Oral Oral  SpO2: 100%  100% 100%  Weight:  91.9 kg    Height:        Intake/Output Summary (Last 24 hours) at 04/12/2021 0841 Last data filed at 04/12/2021 7782 Gross per 24 hour  Intake 1342.19 ml  Output 720 ml  Net 622.19 ml   Filed Weights   04/10/21 1344 04/10/21 2115 04/12/21 0009  Weight: 93.4 kg 94.9 kg 91.9 kg    Examination:  General exam: Overall comfortable, not in distress, pleasant elderly male, deconditioned HEENT: PERRL Respiratory system:  no wheezes or crackles  Cardiovascular system: S1 & S2 heard, RRR.  Gastrointestinal system: Abdomen is nondistended, soft and nontender. Central nervous system: Alert and oriented Extremities: No edema, no clubbing ,no cyanosis Skin: No rashes, no ulcers,no icterus       Data Reviewed: I have personally reviewed following labs and imaging studies  CBC: Recent Labs  Lab 04/10/21 1438 04/10/21 1811 04/11/21 0336  WBC 6.5  --  7.1  NEUTROABS 4.7  --   --   HGB 9.0* 8.8* 9.1*  HCT 27.8* 26.0* 28.2*  MCV 99.3  --  97.6  PLT 144*  --  423*   Basic Metabolic Panel: Recent Labs  Lab 04/10/21 1438 04/10/21 1535 04/10/21 1811 04/11/21 0336 04/12/21 0413  NA 141  --  146* 142 139  K 4.4  --  4.4 4.5 4.0  CL 118*  --   --  116* 116*  CO2 17*  --   --  16* 16*  GLUCOSE 152*  --   --  147* 125*  BUN 84*  --   --  77* 69*  CREATININE 3.46*  --   --  3.08* 2.59*  CALCIUM 9.1  --   --  9.2 8.7*  MG  --  2.1  --   --   --    GFR: Estimated Creatinine Clearance: 25.3 mL/min (A) (by C-G formula based on SCr of 2.59 mg/dL (H)). Liver Function Tests: Recent Labs  Lab 04/10/21 1438   AST 13*  ALT 11  ALKPHOS 89  BILITOT 0.3  PROT 5.8*  ALBUMIN 2.9*   Recent Labs  Lab 04/10/21 1438  LIPASE 42   No results for input(s): AMMONIA in the last 168 hours. Coagulation Profile: No results for input(s): INR, PROTIME in the last 168 hours. Cardiac Enzymes: Recent Labs  Lab 04/10/21 1438  CKTOTAL 62   BNP (last 3 results) No results for input(s): PROBNP in the last 8760 hours. HbA1C: Recent Labs    04/10/21 1438  HGBA1C 7.1*   CBG: Recent Labs  Lab 04/11/21 0639 04/11/21 1100 04/11/21 1850 04/11/21 2122 04/12/21 0501  GLUCAP 136* 166* 150* 147* 121*   Lipid Profile: No results for input(s): CHOL, HDL, LDLCALC, TRIG, CHOLHDL, LDLDIRECT in the last 72 hours. Thyroid Function Tests: Recent Labs  04/10/21 1438  TSH 0.974   Anemia Panel: Recent Labs    04/10/21 1438  TIBC 217*  IRON 51  RETICCTPCT 1.4   Sepsis Labs: No results for input(s): PROCALCITON, LATICACIDVEN in the last 168 hours.  Recent Results (from the past 240 hour(s))  Resp Panel by RT-PCR (Flu A&B, Covid) Nasopharyngeal Swab     Status: None   Collection Time: 04/10/21  4:26 PM   Specimen: Nasopharyngeal Swab; Nasopharyngeal(NP) swabs in vial transport medium  Result Value Ref Range Status   SARS Coronavirus 2 by RT PCR NEGATIVE NEGATIVE Final    Comment: (NOTE) SARS-CoV-2 target nucleic acids are NOT DETECTED.  The SARS-CoV-2 RNA is generally detectable in upper respiratory specimens during the acute phase of infection. The lowest concentration of SARS-CoV-2 viral copies this assay can detect is 138 copies/mL. A negative result does not preclude SARS-Cov-2 infection and should not be used as the sole basis for treatment or other patient management decisions. A negative result may occur with  improper specimen collection/handling, submission of specimen other than nasopharyngeal swab, presence of viral mutation(s) within the areas targeted by this assay, and inadequate  number of viral copies(<138 copies/mL). A negative result must be combined with clinical observations, patient history, and epidemiological information. The expected result is Negative.  Fact Sheet for Patients:  EntrepreneurPulse.com.au  Fact Sheet for Healthcare Providers:  IncredibleEmployment.be  This test is no t yet approved or cleared by the [place] FDA and  has been authorized for detection and/or diagnosis of SARS-CoV-2 by FDA under an Emergency Use Authorization (EUA). This EUA will remain  in effect (meaning this test can be used) for the duration of the COVID-19 declaration under Section 564(b)(1) of the Act, 21 U.S.C.section 360bbb-3(b)(1), unless the authorization is terminated  or revoked sooner.       Influenza A by PCR NEGATIVE NEGATIVE Final   Influenza B by PCR NEGATIVE NEGATIVE Final    Comment: (NOTE) The Xpert Xpress SARS-CoV-2/FLU/RSV plus assay is intended as an aid in the diagnosis of influenza from Nasopharyngeal swab specimens and should not be used as a sole basis for treatment. Nasal washings and aspirates are unacceptable for Xpert Xpress SARS-CoV-2/FLU/RSV testing.  Fact Sheet for Patients: EntrepreneurPulse.com.au  Fact Sheet for Healthcare Providers: IncredibleEmployment.be  This test is not yet approved or cleared by the [place] FDA and has been authorized for detection and/or diagnosis of SARS-CoV-2 by FDA under an Emergency Use Authorization (EUA). This EUA will remain in effect (meaning this test can be used) for the duration of the COVID-19 declaration under Section 564(b)(1) of the Act, 21 U.S.C. section 360bbb-3(b)(1), unless the authorization is terminated or revoked.  Performed at [facility], [place] [address], [place], [place] [address]          Radiology Studies: DG Chest 2 View  Result Date: 04/10/2021 CLINICAL DATA:  Shortness of  breath and weakness over the last week. EXAM: CHEST - 2 VIEW COMPARISON:  None. FINDINGS: Heart size upper limits of normal. Aortic atherosclerosis and tortuosity. The lungs are clear. The vascularity is normal. No evidence of heart failure. No effusion. No significant bone finding. IMPRESSION: No active disease. Heart size upper limits of normal. Aortic atherosclerosis. Electronically Signed   By: Nelson Chimes M.D.   On: 04/10/2021 15:12   MR LUMBAR SPINE WO CONTRAST  Result Date: 04/11/2021 CLINICAL DATA:  Lumbar radiculopathy, no red flags. Bilateral lower extremity weakness. Recent radiation therapy for prostate cancer. Urinary incontinence.  EXAM: MRI LUMBAR SPINE WITHOUT CONTRAST TECHNIQUE: Multiplanar, multisequence MR imaging of the lumbar spine was performed. No intravenous contrast was administered. COMPARISON:  CT abdomen and pelvis 10/02/2020 FINDINGS: Segmentation:  Standard. Alignment: Unchanged facet mediated anterolisthesis of L4 on L5 measuring 5 mm. Vertebrae: No fracture or suspicious marrow lesion. Mild bilateral facet edema at L4-5. Conus medullaris and cauda equina: Conus extends to the L1 level. Conus and cauda equina appear normal. Paraspinal and other soft tissues: Partial imaging of small T2 hyperintense lesions in both kidneys measuring up to 1.4 cm, likely cysts. Disc levels: Disc desiccation throughout the lumbar spine. Preserved disc space heights. L1-2: Negative. L2-3: Disc bulging and mild facet and ligamentum flavum hypertrophy result in mild right greater than left lateral recess stenosis without significant spinal or neural foraminal stenosis. L3-4: Disc bulging and mild facet and ligamentum flavum hypertrophy result in mild-to-moderate bilateral lateral recess stenosis and mild right neural foraminal stenosis without significant spinal stenosis. 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 Potential left L4 nerve root impingement in the neural foramen. L5-S1: Mild disc bulging, severe facet hypertrophy, and a 4 mm synovial cyst projecting anterior from the right facet joint into the superior aspect of the right neural foramen result in mild bilateral neural foraminal stenosis without spinal stenosis. IMPRESSION: 1. 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 2. Mild-to-moderate lateral recess stenosis and mild right neural foraminal stenosis at L3-4. Electronically Signed   By: Logan Bores M.D.   On: 04/11/2021 17:18   US RENAL  Result Date: 04/10/2021 CLINICAL DATA:  Acute renal insufficiency EXAM: RENAL / URINARY TRACT ULTRASOUND COMPLETE COMPARISON:  None. FINDINGS: Right Kidney: Renal measurements: 10.9 x 5.3 x 4.5 cm = volume: 135.1 mL. Echogenicity within normal limits. No mass or hydronephrosis visualized. Left Kidney: Renal measurements: 10.8 x 5.9 x 4.1 cm = volume: 137.9 mL. Echogenicity within normal limits. No mass or hydronephrosis visualized. Bladder: Appears normal for degree of bladder distention. Other: None. IMPRESSION: 1. Unremarkable renal ultrasound. Electronically Signed   By: Randa Ngo M.D.   On: 04/10/2021 18:18        Scheduled Meds:  aspirin  325 mg Oral Daily   clopidogrel  75 mg Oral QHS   heparin  5,000 Units Subcutaneous Q12H   insulin aspart  0-9 Units Subcutaneous TID WC   metoprolol succinate  25 mg Oral Daily   sodium bicarbonate  650 mg Oral BID   tamsulosin  0.8 mg Oral QHS   Continuous Infusions:  sodium chloride 100 mL/hr at 04/12/21 0453     LOS: 2  days    Time spent: 35 mins.More than 50% of that time was spent in counseling and/or coordination of care.      Shelly Coss, MD [facility] P8/27/2022, 8:41 AM

## 2021-04-13 LAB — GLUCOSE, CAPILLARY
Glucose-Capillary: 130 mg/dL — ABNORMAL HIGH (ref 70–99)
Glucose-Capillary: 107 mg/dL — ABNORMAL HIGH (ref 70–99)
Glucose-Capillary: 143 mg/dL — ABNORMAL HIGH (ref 70–99)
Glucose-Capillary: 132 mg/dL — ABNORMAL HIGH (ref 70–99)

## 2021-04-13 LAB — BASIC METABOLIC PANEL
Anion gap: 7 (ref 5–15)
BUN: 59 mg/dL — ABNORMAL HIGH (ref 8–23)
CO2: 16 mmol/L — ABNORMAL LOW (ref 22–32)
Calcium: 9 mg/dL (ref 8.9–10.3)
Chloride: 115 mmol/L — ABNORMAL HIGH (ref 98–111)
Creatinine, Ser: 2.45 mg/dL — ABNORMAL HIGH (ref 0.61–1.24)
GFR, Estimated: 26 mL/min — ABNORMAL LOW (ref >60)
Glucose, Bld: 134 mg/dL — ABNORMAL HIGH (ref 70–99)
Potassium: 4.1 mmol/L (ref 3.5–5.1)
Sodium: 138 mmol/L (ref 135–145)

## 2021-04-13 NOTE — Progress Notes (Signed)
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  He recently had prostate cancer treatment and completed 4 weeks of radiation to weeks ago.  He also is complaining of urinary incontinence which has progressively worsened.  Recently started wearing diapers.  Last 2 weeks, experience extreme weakness of the muscles around the pelvis and bilateral thighs.  He has recently been unable to walk because of bilateral lower extremity weakness.  On presentation.,he  was found to have worsening of his renal function with creatinine in the range of 3.  Baseline creatinine around 2.  Patient admitted for further work-up.  Lumbar MRI showed degenerative disease.  PT/OT recommended skilled nursing facility on discharge.  Medically stable for discharge as soon as possible   Assessment & Plan:   Active Problems:   AKI (acute kidney injury) ([place])   AKI on CKD stage IIIb: Baseline creatinine around 2.On presentation appeared hypovolemic.  Takes Lasix at home, suspected overdiuresis. Renal ultrasound did not show any obstruction or hydronephrosis. Treated with IV fluids,now D/Ced.  Kidney function gradually improving.  Urine sodium of 50  Generalized weakness/bilateral lower extremity weakness, ambulatory dysfunction: He was bilateral lower extremity weakness.  PT/OT evaluation requested,recommended SNF.  He lives with his wife.  Uses walker/cane for ambulation. 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  We recommend neurosurgery follow-up as an outpatient.Denies any backpain  Urinary incontinence: UA not so stable for UTI denies dysuria..  Likely associated with prostate cancer/radiation treatment.  We recommend follow-up with urology as an outpatient.  Elevated troponin: Denies any chest pain.  Likely secondary to supply demand ischemia from severe AKI.  Chronic diastolic congestive heart failure: Takes Lasix at home.  Probably overt diuresed at this point.  Lasix held.  Started on IV fluids.  Hypertension: Presented with hypertension.  Continue beta-blocker, as needed hydralazine.  Monitor blood pressure.  Currently stable  History of frequent PVCs: Beta-blocker to be continued .  He follows with cardiology at [facility]  Chronic normocytic anemia: Currently hemoglobin is stable.  Diabetes type 2: Continue sliding scale for now.  Takes metformin at home.  Hemoglobin A1c of 7.1  History of prostate cancer: Follows with urology.  Recently completed radiation therapy.           DVT prophylaxis:Heparin [place] Code Status: Full Family Communication: Called wife on phone for update several times, call not received Status is: Inpatient  Remains inpatient appropriate because:Inpatient level of care appropriate due to severity of illness  Dispo: The patient is from: Home              Anticipated d/c is to: Skilled nursing facility              Patient currently is medically stable for discharge   Difficult to place patient No     Consultants: None  Procedures: None  Antimicrobials:  Anti-infectives (From admission, onward)    None       Subjective:  Patient seen and examined  the bedside this morning.  Hemodynamically stable.  Denies any complaints today.  Objective: Vitals:   04/12/21 1146 04/12/21 1638 04/12/21 1921 04/13/21 0442  BP: 130/78  (!) 123/57 129/71 (!) 151/70  Pulse: 64 60 66 65  Resp: 17 18 18 18   Temp: 97.9 F (36.6 C) 97.6 F (36.4 C) 98.1 F (36.7 C) 98.7 F (37.1 C)  TempSrc: Oral Oral Oral Oral  SpO2: 100% 99% 100% 100%  Weight:    95.4 kg  Height:        Intake/Output Summary (Last 24 hours) at 04/13/2021 0749 Last data filed at 04/13/2021 0449 Gross per 24 hour  Intake 820 ml  Output 700 ml  Net 120 ml   Filed Weights   04/10/21 2115 04/12/21 0009 04/13/21 0442  Weight: 94.9 kg 91.9 kg 95.4 kg    Examination:  General exam: Very deconditioned, lying in bed.  Comfortable HEENT: PERRL Respiratory system:  no wheezes or crackles  Cardiovascular system: S1 & S2 heard, RRR.  Gastrointestinal system: Abdomen is nondistended, soft and nontender. Central nervous system: Alert and oriented Extremities: No edema, no clubbing ,no cyanosis Skin: No rashes, no ulcers,no icterus      Data Reviewed: I have personally reviewed following labs and imaging studies  CBC: Recent Labs  Lab 04/10/21 1438 04/10/21 1811 04/11/21 0336  WBC 6.5  --  7.1  NEUTROABS 4.7  --   --   HGB 9.0* 8.8* 9.1*  HCT 27.8* 26.0* 28.2*  MCV 99.3  --  97.6  PLT 144*  --  253*   Basic Metabolic Panel: Recent Labs  Lab 04/10/21 1438 04/10/21 1535 04/10/21 1811 04/11/21 0336 04/12/21 0413 04/13/21 0405  NA 141  --  146* 142 139 138  K 4.4  --  4.4 4.5 4.0 4.1  CL 118*  --   --  116* 116* 115*  CO2 17*  --   --  16* 16* 16*  GLUCOSE 152*  --   --  147* 125* 134*  BUN 84*  --   --  77* 69* 59*  CREATININE 3.46*  --   --  3.08* 2.59* 2.45*  CALCIUM 9.1  --   --  9.2 8.7* 9.0  MG  --  2.1  --   --   --   --    GFR: Estimated Creatinine Clearance: 26.7 mL/min (A) (by C-G formula based on SCr of 2.45 mg/dL (H)). Liver Function Tests: Recent Labs  Lab 04/10/21 1438  AST 13*  ALT 11  ALKPHOS 89  BILITOT 0.3  PROT 5.8*  ALBUMIN 2.9*   Recent Labs  Lab 04/10/21 1438  LIPASE 42   No results for input(s):  AMMONIA in the last 168 hours. Coagulation Profile: No results for input(s): INR, PROTIME in the last 168 hours. Cardiac Enzymes: Recent Labs  Lab 04/10/21 1438  CKTOTAL 62   BNP (last 3 results) No results for input(s): PROBNP in the last 8760 hours. HbA1C: Recent Labs    04/10/21 1438  HGBA1C 7.1*   CBG: Recent Labs  Lab 04/12/21 0501 04/12/21 1154 04/12/21 1645 04/12/21 2117 04/13/21 0606  GLUCAP 121* 180* 139* 142* 130*   Lipid Profile: No results for input(s): CHOL, HDL, LDLCALC, TRIG, CHOLHDL, LDLDIRECT in the last 72 hours. Thyroid Function Tests: Recent Labs    04/10/21 1438  TSH 0.974   Anemia Panel: Recent Labs    04/10/21 1438  TIBC 217*  IRON 51  RETICCTPCT 1.4   Sepsis  Labs: No results for input(s): PROCALCITON, LATICACIDVEN in the last 168 hours.  Recent Results (from the past 240 hour(s))  Resp Panel by RT-PCR (Flu A&B, Covid) Nasopharyngeal Swab     Status: None   Collection Time: 04/10/21  4:26 PM   Specimen: Nasopharyngeal Swab; Nasopharyngeal(NP) swabs in vial transport medium  Result Value Ref Range Status   SARS Coronavirus 2 by RT PCR NEGATIVE NEGATIVE Final    Comment: (NOTE) SARS-CoV-2 target nucleic acids are NOT DETECTED.  The SARS-CoV-2 RNA is generally detectable in upper respiratory specimens during the acute phase of infection. The lowest concentration of SARS-CoV-2 viral copies this assay can detect is 138 copies/mL. A negative result does not preclude SARS-Cov-2 infection and should not be used as the sole basis for treatment or other patient management decisions. A negative result may occur with  improper specimen collection/handling, submission of specimen other than nasopharyngeal swab, presence of viral mutation(s) within the areas targeted by this assay, and inadequate number of viral copies(<138 copies/mL). A negative result must be combined with clinical observations, patient history, and  epidemiological information. The expected result is Negative.  Fact Sheet for Patients:  EntrepreneurPulse.com.au  Fact Sheet for Healthcare Providers:  IncredibleEmployment.be  This test is no t yet approved or cleared by the [place] FDA and  has been authorized for detection and/or diagnosis of SARS-CoV-2 by FDA under an Emergency Use Authorization (EUA). This EUA will remain  in effect (meaning this test can be used) for the duration of the COVID-19 declaration under Section 564(b)(1) of the Act, 21 U.S.C.section 360bbb-3(b)(1), unless the authorization is terminated  or revoked sooner.       Influenza A by PCR NEGATIVE NEGATIVE Final   Influenza B by PCR NEGATIVE NEGATIVE Final    Comment: (NOTE) The Xpert Xpress SARS-CoV-2/FLU/RSV plus assay is intended as an aid in the diagnosis of influenza from Nasopharyngeal swab specimens and should not be used as a sole basis for treatment. Nasal washings and aspirates are unacceptable for Xpert Xpress SARS-CoV-2/FLU/RSV testing.  Fact Sheet for Patients: EntrepreneurPulse.com.au  Fact Sheet for Healthcare Providers: IncredibleEmployment.be  This test is not yet approved or cleared by the [place] FDA and has been authorized for detection and/or diagnosis of SARS-CoV-2 by FDA under an Emergency Use Authorization (EUA). This EUA will remain in effect (meaning this test can be used) for the duration of the COVID-19 declaration under Section 564(b)(1) of the Act, 21 U.S.C. section 360bbb-3(b)(1), unless the authorization is terminated or revoked.  Performed at [facility], [place] [address], [place], [place] [address]          Radiology Studies: MR LUMBAR SPINE WO CONTRAST  Result Date: 04/11/2021 CLINICAL DATA:  Lumbar radiculopathy, no red flags. Bilateral lower extremity weakness. Recent radiation therapy for prostate cancer. Urinary  incontinence. EXAM: MRI LUMBAR SPINE WITHOUT CONTRAST TECHNIQUE: Multiplanar, multisequence MR imaging of the lumbar spine was performed. No intravenous contrast was administered. COMPARISON:  CT abdomen and pelvis 10/02/2020 FINDINGS: Segmentation:  Standard. Alignment: Unchanged facet mediated anterolisthesis of L4 on L5 measuring 5 mm. Vertebrae: No fracture or suspicious marrow lesion. Mild bilateral facet edema at L4-5. Conus medullaris and cauda equina: Conus extends to the L1 level. Conus and cauda equina appear normal. Paraspinal and other soft tissues: Partial imaging of small T2 hyperintense lesions in both kidneys measuring up to 1.4 cm, likely cysts. Disc levels: Disc desiccation throughout the lumbar spine. Preserved disc space heights. L1-2: Negative. L2-3: Disc bulging  and mild facet and ligamentum flavum hypertrophy result in mild right greater than left lateral recess stenosis without significant spinal or neural foraminal stenosis. L3-4: Disc bulging and mild facet and ligamentum flavum hypertrophy result in mild-to-moderate bilateral lateral recess stenosis and mild right neural foraminal stenosis without significant spinal stenosis. 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 Potential left L4 nerve root impingement in the neural foramen. L5-S1: Mild disc bulging, severe facet hypertrophy, and a 4 mm synovial cyst projecting anterior from the right facet joint into the superior aspect of the right neural foramen result in mild bilateral neural foraminal stenosis without spinal stenosis. IMPRESSION: 1. 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 2. Mild-to-moderate lateral recess stenosis and mild right neural foraminal stenosis at L3-4. Electronically Signed   By: Logan Bores M.D.   On: 04/11/2021 17:18        Scheduled Meds:  aspirin  325 mg Oral Daily   clopidogrel  75 mg Oral QHS   heparin  5,000 Units Subcutaneous Q12H   insulin aspart  0-9 Units Subcutaneous TID WC   metoprolol succinate  25 mg Oral Daily   sodium bicarbonate  650 mg Oral BID   tamsulosin  0.8 mg Oral QHS   Continuous Infusions:     LOS: 3 days    Time spent: 25 mins.More than 50% of that time was spent in counseling and/or coordination of care.      Shelly Coss, MD [facility] P8/28/2022, 7:49 AM

## 2021-04-13 NOTE — NC FL2 (Signed)
[place] LEVEL OF CARE SCREENING TOOL     IDENTIFICATION  Patient Name: Thomas Duarte Birthdate: July 18, 1941 Sex: male Admission Date (Current Location): 04/10/2021  [facility] and [place] Number:  Herbalist and Address:  [facility], [place] [address] [place], [place] [address]      Provider Number: [id number]  Attending Physician Name and Address:  Shelly Coss, MD  Relative Name and Phone Number:  Kalijah, Zeiss (Spouse)   [phone]    Current Level of Care: Hospital Recommended Level of Care: [place] Prior Approval Number:    Date Approved/Denied:   PASRR Number:    Discharge Plan: SNF    Current Diagnoses: Patient Active Problem List   Diagnosis Date Noted   AKI (acute kidney injury) ([place]) 04/10/2021   Prostate cancer ([place]) 09/09/2020    Orientation RESPIRATION BLADDER Height & Weight     Self, Time, Situation, Place  Normal Continent, Indwelling catheter Weight: 210 lb 5.1 oz (95.4 kg) Height:  6\' 1"  (185.4 cm)  BEHAVIORAL SYMPTOMS/MOOD NEUROLOGICAL BOWEL NUTRITION STATUS      Continent Diet (See DC Summary)  AMBULATORY STATUS COMMUNICATION OF NEEDS Skin   Limited Assist Verbally Normal                       Personal Care Assistance Level of Assistance  Bathing, Feeding, Dressing Bathing Assistance: Limited assistance Feeding assistance: Independent Dressing Assistance: Limited assistance     Functional Limitations Info  Sight, Hearing, Speech Sight Info: Adequate Hearing Info: Adequate Speech Info: Adequate    SPECIAL CARE FACTORS FREQUENCY  PT (By licensed PT), OT (By licensed OT)     PT Frequency: 5x a week OT Frequency: 5x a week            Contractures Contractures Info: Not present    Additional Factors Info  Code Status, Allergies Code Status Info: 5x a week Allergies Info: NKA           Current Medications (04/13/2021):  This is the current  hospital active medication list Current Facility-Administered Medications  Medication Dose Route Frequency Provider Last Rate Last Admin   aspirin tablet 325 mg  325 mg Oral Daily Wynetta Fines T, MD   325 mg at 04/13/21 1037   clopidogrel (PLAVIX) tablet 75 mg  75 mg Oral QHS Wynetta Fines T, MD   75 mg at 04/12/21 2302   heparin injection 5,000 Units  5,000 Units Subcutaneous Q12H Wynetta Fines T, MD   5,000 Units at 04/13/21 1039   hydrALAZINE (APRESOLINE) tablet 25 mg  25 mg Oral Q6H PRN Wynetta Fines T, MD   25 mg at 04/12/21 2302   insulin aspart (novoLOG) injection 0-9 Units  0-9 Units Subcutaneous TID WC Wynetta Fines T, MD   1 Units at 04/13/21 3419   metoprolol succinate (TOPROL-XL) 24 hr tablet 25 mg  25 mg Oral Daily Wynetta Fines T, MD   25 mg at 04/13/21 1037   ondansetron (ZOFRAN) tablet 4 mg  4 mg Oral Q6H PRN Lequita Halt, MD       Or   ondansetron [facility]) injection 4 mg  4 mg Intravenous Q6H PRN Wynetta Fines T, MD       polyvinyl alcohol (LIQUIFILM TEARS) 1.4 % ophthalmic solution 1 drop  1 drop Both Eyes PRN Heloise Purpura, RPH   1 drop at 04/12/21 0934   sodium bicarbonate tablet 650 mg  650  mg Oral BID Wynetta Fines T, MD   650 mg at 04/13/21 1037   tamsulosin (FLOMAX) capsule 0.8 mg  0.8 mg Oral QHS Wynetta Fines T, MD   0.8 mg at 04/12/21 2302   tobramycin (TOBREX) 0.3 % ophthalmic solution 1 drop  1 drop Both Eyes Q4H PRN Shelly Coss, MD   1 drop at 04/12/21 2395     Discharge Medications: Please see discharge summary for a list of discharge medications.  Relevant Imaging Results:  Relevant Lab Results:   Additional Information SNF: [id number]; 2 covid vax and 1 booster  Reece Agar, LCSWA

## 2021-04-13 NOTE — TOC Initial Note (Signed)
Transition of Care [facility]) - Initial/Assessment Note    Patient Details  Name: Thomas Duarte MRN: [id number] Date of Birth: 16-Sep-1940  [facility]) CM/SW Contact:    Tresa Endo Phone Number: 04/13/2021, 1:53 PM  Clinical Narrative:                 CSW received SNF consult. CSW met with pt at bedside and wife via phone. CSW introduced self and explained role at the hospital. Pt reports that PTA the pt lived at home with his wife and 11 year old grandson who they have custody of. PT reports needs min assistance and was ambulatory prior to admission at home. PT reports pt walked 80 feet using a 2 wheeled rollin walker.   CSW reviewed PT/OT recommendations for SNF. Pt reports SNF is needed for him to get back to his baseline. Pt wife expressed pt has been to a SNF a while ago and they used [facility] which is convenient. Pt wife does not drive long distances. Pt gave CSW permission to fax out to facilities in the area. Pt wife is open to referrals to be sent out to facilities in [place]. CSW gave pt medicare.gov rating list to review. PT reports they are covid negative with 2 vax and one booster.  CSW will continue to follow.    Expected Discharge Plan: Skilled Nursing Facility Barriers to Discharge: Continued Medical Work up   Patient Goals and CMS Choice Patient states their goals for this hospitalization and ongoing recovery are:: Rehab CMS Medicare.gov Compare Post Acute Care list provided to:: Patient Choice offered to / list presented to : Patient  Expected Discharge Plan and Services Expected Discharge Plan: [place] In-house Referral: Clinical Social Work   Post Acute Care Choice: [place] Living arrangements for the past 2 months: [place]                                      Prior Living Arrangements/Services Living arrangements for the past 2 months: Single Family Home Lives with:: Spouse, Minor  Children (Has custody of 27 year old grandson) Patient language and need for interpreter reviewed:: Yes Do you feel safe going back to the place where you live?: Yes      Need for Family Participation in Patient Care: Yes (Comment) Care giver support system in place?: Yes (comment)   Criminal Activity/Legal Involvement Pertinent to Current Situation/Hospitalization: No - Comment as needed  Activities of Daily Living Home Assistive Devices/Equipment: None ADL Screening (condition at time of admission) Patient's cognitive ability adequate to safely complete daily activities?: Yes Is the patient deaf or have difficulty hearing?: No Does the patient have difficulty seeing, even when wearing glasses/contacts?: No Does the patient have difficulty concentrating, remembering, or making decisions?: No Patient able to express need for assistance with ADLs?: Yes Does the patient have difficulty dressing or bathing?: Yes Independently performs ADLs?: No Communication: Independent Dressing (OT): Needs assistance Is this a change from baseline?: Change from baseline, expected to last >3 days Grooming: Needs assistance Is this a change from baseline?: Change from baseline, expected to last >3 days Feeding: Independent Bathing: Needs assistance Is this a change from baseline?: Change from baseline, expected to last >3 days Toileting: Needs assistance Is this a change from baseline?: Change from baseline, expected to last >3days In/Out Bed: Dependent Is this a change  from baseline?: Change from baseline, expected to last >3 days Walks in Home: Needs assistance Is this a change from baseline?: Change from baseline, expected to last >3 days Does the patient have difficulty walking or climbing stairs?: Yes Weakness of Legs: Both Weakness of Arms/Hands: None  Permission Sought/Granted Permission sought to share information with : Family Supports, Chartered certified accountant granted to  share information with : Yes, Verbal Permission Granted  Share Information with NAME: Thomas Duarte,Thomas Duarte (Spouse)   [phone]  Permission granted to share info w AGENCY: SNF  Permission granted to share info w Relationship: Thomas Duarte,Thomas Duarte (Spouse)   [phone]  Permission granted to share info w Contact Information: Thomas Duarte,Thomas Duarte (Spouse)   [phone]  Emotional Assessment Appearance:: Appears stated age Attitude/Demeanor/Rapport: Engaged Affect (typically observed): Accepting Orientation: : Oriented to Self, Oriented to Place, Oriented to  Time, Oriented to Situation Alcohol / Substance Use: Not Applicable Psych Involvement: No (comment)  Admission diagnosis:  Weakness [R53.1] AKI (acute kidney injury) ([place]) [N17.9] Patient Active Problem List   Diagnosis Date Noted   AKI (acute kidney injury) ([place]) 04/10/2021   Prostate cancer ([place]) 09/09/2020   PCP:  Monico Blitz, MD Pharmacy:   [id number] [place], [id number]: [phone]: [phone]    Social Determinants of Health (SDOH) Interventions    Readmission Risk Interventions No flowsheet data found.

## 2021-04-14 LAB — GLUCOSE, CAPILLARY
Glucose-Capillary: 116 mg/dL — ABNORMAL HIGH (ref 70–99)
Glucose-Capillary: 148 mg/dL — ABNORMAL HIGH (ref 70–99)
Glucose-Capillary: 155 mg/dL — ABNORMAL HIGH (ref 70–99)
Glucose-Capillary: 112 mg/dL — ABNORMAL HIGH (ref 70–99)

## 2021-04-14 MED ORDER — SODIUM BICARBONATE 650 MG PO TABS
650.0000 mg | ORAL_TABLET | Freq: Three times a day (TID) | ORAL | Status: DC
  Administered 2021-04-14 – 2021-04-22 (×22): 650 mg via ORAL
  Filled 2021-04-14 (×22): qty 1

## 2021-04-14 NOTE — Progress Notes (Signed)
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  He recently had prostate cancer treatment and completed 4 weeks of radiation to weeks ago.  He also is complaining of urinary incontinence which has progressively worsened.  Recently started wearing diapers.  Last 2 weeks, experience extreme weakness of the muscles around the pelvis and bilateral thighs.  He has recently been unable to walk because of bilateral lower extremity weakness.  On presentation.,he  was found to have worsening of his renal function with creatinine in the range of 3.  Baseline creatinine around 2.  Patient admitted for further work-up.  Lumbar MRI showed degenerative disease.  PT/OT recommended skilled nursing facility on discharge.  Medically stable for discharge as soon as possible   Assessment & Plan:   Active Problems:   AKI (acute kidney injury) ([place])   AKI on CKD stage IIIb: Baseline creatinine around 2.On presentation appeared hypovolemic.  Takes Lasix at home, suspected overdiuresis. Renal ultrasound did not show any obstruction or hydronephrosis. Treated with IV fluids,now D/Ced.  Kidney function gradually improving.  Urine sodium of 50 We will probably resume Lasix at 40 mg daily  Generalized weakness/bilateral lower extremity weakness, ambulatory dysfunction: He was bilateral lower extremity weakness.  PT/OT evaluation requested,recommended SNF.  He lives with his wife.  Uses walker/cane for ambulation. 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  We recommend neurosurgery follow-up as an outpatient.Denies any backpain  Urinary incontinence: UA not so stable for UTI denies dysuria..  Likely associated with prostate cancer/radiation treatment.  We recommend follow-up with urology as an outpatient.  Elevated troponin: Denies any chest pain.  Likely secondary to supply demand ischemia from severe AKI.  Chronic diastolic congestive heart failure: Takes Lasix at home.  Probably overt diuresed when we saw the patient during admission.  Lasix held.  Started on IV fluids.Fluids D/ced, will resume Lasix 40 mg daily from tomorrow depending upon the BMP.  Hypertension: Presented with hypertension. Now BP stable. Continue beta-blocker, as needed hydralazine.Home lisinopril on hold due to AKI  History of frequent PVCs: Beta-blocker to be continued .  He follows with cardiology at [facility]  Chronic normocytic anemia: Currently hemoglobin is stable.  Diabetes type 2: Continue sliding scale for now.  Takes metformin at home.  Hemoglobin A1c of 7.1  History of prostate cancer: Follows with urology.  Recently completed radiation therapy.           DVT prophylaxis:Heparin [facility] Code Status: Full Family Communication: Called wife on phone for update several times, call not received Status is: Inpatient  Remains inpatient appropriate because:Inpatient level of care appropriate due to severity of illness  Dispo: The patient is from: Home              Anticipated d/c is to: Skilled nursing facility              Patient currently is medically stable for discharge   Difficult to place patient No     Consultants:  None  Procedures: None  Antimicrobials:  Anti-infectives (From admission, onward)    None       Subjective:  Patient seen and examined at the bedside  this morning.  Hemodynamically stable.  Comfortable.  No new complaints.  Objective: Vitals:   04/13/21 1037 04/13/21 1528 04/13/21 2054 04/14/21 0507  BP:  129/65 138/71 (!) 152/68  Pulse: 61 60 67 63  Resp:   16 18  Temp:  97.7 F (36.5 C) 98.9 F (37.2 C) 98.9 F (37.2 C)  TempSrc:  Oral Oral Oral  SpO2:  97% 99% 98%  Weight:    95.4 kg  Height:        Intake/Output Summary (Last 24 hours) at 04/14/2021 0807 Last data filed at 04/13/2021 1837 Gross per 24 hour  Intake 660 ml  Output 350 ml  Net 310 ml   Filed Weights   04/12/21 0009 04/13/21 0442 04/14/21 0507  Weight: 91.9 kg 95.4 kg 95.4 kg    Examination:  General exam: Overall comfortable, not in distress HEENT: PERRL Respiratory system:  no wheezes or crackles  Cardiovascular system: S1 & S2 heard, RRR.  Gastrointestinal system: Abdomen is nondistended, soft and nontender. Central nervous system: Alert and oriented Extremities: No edema, no clubbing ,no cyanosis Skin: No rashes, no ulcers,no icterus      Data Reviewed: I have personally reviewed following labs and imaging studies  CBC: Recent Labs  Lab 04/10/21 1438 04/10/21 1811 04/11/21 0336  WBC 6.5  --  7.1  NEUTROABS 4.7  --   --   HGB 9.0* 8.8* 9.1*  HCT 27.8* 26.0* 28.2*  MCV 99.3  --  97.6  PLT 144*  --  921*   Basic Metabolic Panel: Recent Labs  Lab 04/10/21 1438 04/10/21 1535 04/10/21 1811 04/11/21 0336 04/12/21 0413 04/13/21 0405  NA 141  --  146* 142 139 138  K 4.4  --  4.4 4.5 4.0 4.1  CL 118*  --   --  116* 116* 115*  CO2 17*  --   --  16* 16* 16*  GLUCOSE 152*  --   --  147* 125* 134*  BUN 84*  --   --  77* 69* 59*  CREATININE 3.46*  --   --  3.08* 2.59* 2.45*  CALCIUM 9.1  --   --  9.2 8.7* 9.0  MG  --  2.1  --   --   --   --    GFR: Estimated Creatinine Clearance: 26.7 mL/min (A) (by C-G formula based on SCr of 2.45 mg/dL (H)). Liver Function Tests: Recent Labs  Lab 04/10/21 1438  AST 13*  ALT 11  ALKPHOS 89   BILITOT 0.3  PROT 5.8*  ALBUMIN 2.9*   Recent Labs  Lab 04/10/21 1438  LIPASE 42   No results for input(s): AMMONIA in the last 168 hours. Coagulation Profile: No results for input(s): INR, PROTIME in the last 168 hours. Cardiac Enzymes: Recent Labs  Lab 04/10/21 1438  CKTOTAL 62   BNP (last 3 results) No results for input(s): PROBNP in the last 8760 hours. HbA1C: No results for input(s): HGBA1C in the last 72 hours.  CBG: Recent Labs  Lab 04/13/21 0606 04/13/21 1140 04/13/21 1525 04/13/21 2055 04/14/21 0613  GLUCAP 130* 107* 143* 132* 116*   Lipid Profile: No results for input(s): CHOL, HDL, LDLCALC, TRIG, CHOLHDL, LDLDIRECT in the last 72 hours. Thyroid Function Tests: No results for input(s): TSH, T4TOTAL, FREET4, T3FREE, THYROIDAB in the  last 72 hours.  Anemia Panel: No results for input(s): VITAMINB12, FOLATE, FERRITIN, TIBC, IRON, RETICCTPCT in the last 72 hours.  Sepsis Labs: No results for input(s): PROCALCITON, LATICACIDVEN in the last 168 hours.  Recent Results (from the past 240 hour(s))  Resp Panel by RT-PCR (Flu A&B, Covid) Nasopharyngeal Swab     Status: None   Collection Time: 04/10/21  4:26 PM   Specimen: Nasopharyngeal Swab; Nasopharyngeal(NP) swabs in vial transport medium  Result Value Ref Range Status   SARS Coronavirus 2 by RT PCR NEGATIVE NEGATIVE Final    Comment: (NOTE) SARS-CoV-2 target nucleic acids are NOT DETECTED.  The SARS-CoV-2 RNA is generally detectable in upper respiratory specimens during the acute phase of infection. The lowest concentration of SARS-CoV-2 viral copies this assay can detect is 138 copies/mL. A negative result does not preclude SARS-Cov-2 infection and should not be used as the sole basis for treatment or other patient management decisions. A negative result may occur with  improper specimen collection/handling, submission of specimen other than nasopharyngeal swab, presence of viral mutation(s) within  the areas targeted by this assay, and inadequate number of viral copies(<138 copies/mL). A negative result must be combined with clinical observations, patient history, and epidemiological information. The expected result is Negative.  Fact Sheet for Patients:  EntrepreneurPulse.com.au  Fact Sheet for Healthcare Providers:  IncredibleEmployment.be  This test is no t yet approved or cleared by the [place] FDA and  has been authorized for detection and/or diagnosis of SARS-CoV-2 by FDA under an Emergency Use Authorization (EUA). This EUA will remain  in effect (meaning this test can be used) for the duration of the COVID-19 declaration under Section 564(b)(1) of the Act, 21 U.S.C.section 360bbb-3(b)(1), unless the authorization is terminated  or revoked sooner.       Influenza A by PCR NEGATIVE NEGATIVE Final   Influenza B by PCR NEGATIVE NEGATIVE Final    Comment: (NOTE) The Xpert Xpress SARS-CoV-2/FLU/RSV plus assay is intended as an aid in the diagnosis of influenza from Nasopharyngeal swab specimens and should not be used as a sole basis for treatment. Nasal washings and aspirates are unacceptable for Xpert Xpress SARS-CoV-2/FLU/RSV testing.  Fact Sheet for Patients: EntrepreneurPulse.com.au  Fact Sheet for Healthcare Providers: IncredibleEmployment.be  This test is not yet approved or cleared by the [place] FDA and has been authorized for detection and/or diagnosis of SARS-CoV-2 by FDA under an Emergency Use Authorization (EUA). This EUA will remain in effect (meaning this test can be used) for the duration of the COVID-19 declaration under Section 564(b)(1) of the Act, 21 U.S.C. section 360bbb-3(b)(1), unless the authorization is terminated or revoked.  Performed at [facility], [place] [address], [place], [place] [address]          Radiology Studies: No results  found.      Scheduled Meds:  aspirin  325 mg Oral Daily   clopidogrel  75 mg Oral QHS   heparin  5,000 Units Subcutaneous Q12H   insulin aspart  0-9 Units Subcutaneous TID WC   metoprolol succinate  25 mg Oral Daily   sodium bicarbonate  650 mg Oral BID   tamsulosin  0.8 mg Oral QHS   Continuous Infusions:     LOS: 4 days    Time spent: 25 mins.More than 50% of that time was spent in counseling and/or coordination of care.      Shelly Coss, MD [facility] P8/29/2022, 8:07 AM

## 2021-04-14 NOTE — Plan of Care (Signed)
  Problem: Pain Managment: Goal: General experience of comfort will improve Outcome: Completed/Met   Problem: Elimination: Goal: Will not experience complications related to urinary retention Outcome: Completed/Met   Problem: Coping: Goal: Level of anxiety will decrease Outcome: Completed/Met   Problem: Nutrition: Goal: Adequate nutrition will be maintained Outcome: Completed/Met

## 2021-04-15 LAB — GLUCOSE, CAPILLARY
Glucose-Capillary: 111 mg/dL — ABNORMAL HIGH (ref 70–99)
Glucose-Capillary: 159 mg/dL — ABNORMAL HIGH (ref 70–99)
Glucose-Capillary: 123 mg/dL — ABNORMAL HIGH (ref 70–99)
Glucose-Capillary: 102 mg/dL — ABNORMAL HIGH (ref 70–99)

## 2021-04-15 LAB — BASIC METABOLIC PANEL
Anion gap: 9 (ref 5–15)
BUN: 53 mg/dL — ABNORMAL HIGH (ref 8–23)
CO2: 16 mmol/L — ABNORMAL LOW (ref 22–32)
Calcium: 8.8 mg/dL — ABNORMAL LOW (ref 8.9–10.3)
Chloride: 111 mmol/L (ref 98–111)
Creatinine, Ser: 2.27 mg/dL — ABNORMAL HIGH (ref 0.61–1.24)
GFR, Estimated: 28 mL/min — ABNORMAL LOW (ref >60)
Glucose, Bld: 111 mg/dL — ABNORMAL HIGH (ref 70–99)
Potassium: 4 mmol/L (ref 3.5–5.1)
Sodium: 136 mmol/L (ref 135–145)

## 2021-04-15 MED ORDER — FUROSEMIDE 40 MG PO TABS
40.0000 mg | ORAL_TABLET | Freq: Every day | ORAL | Status: DC
  Administered 2021-04-16 – 2021-04-22 (×6): 40 mg via ORAL
  Filled 2021-04-15 (×6): qty 1

## 2021-04-15 NOTE — Progress Notes (Signed)
Physical Therapy Treatment Patient Details Name: Thomas Duarte MRN: [id number] DOB: 1940/11/18 Today's Date: 04/15/2021    History of Present Illness Pt is an 80 y.o. male admitted 04/10/21 with c/o worsening BLE weakness, urinary incontinence; of note, pt recently started prostate CA treatment (completed 4 wks of radiation 2 wks prior). Workup for AKI on CKD 3. Lumbar MRI showed degenerative disease. 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   PT Comments    Pt slowly progressing with mobility. Today's session focused on transfer and gait training with rollator for added stability and enery conservation; pt requiring intermittent min guard to minA for mobility. Pt remains limited by generalized weakness, decreased activity tolerance, and impaired balance strategies/postural reactions, as well as LLE pain. Continue to recommend SNF-level therapies to maximize functional mobility and independence prior to return home.   Follow Up Recommendations  SNF;Supervision for mobility/OOB     Equipment Recommendations  None recommended by PT    Recommendations for Other Services       Precautions / Restrictions Precautions Precautions: Fall Restrictions Weight Bearing Restrictions: No    Mobility  Bed Mobility Overal bed mobility: Needs Assistance Bed Mobility: Sit to Supine     Supine to sit: Supervision;HOB elevated Sit to supine: Min assist   General bed mobility comments: MinA for LE management return to supine; increased time and effort    Transfers Overall transfer level: Needs assistance Equipment used: 1 person hand held assist;4-wheeled walker Transfers: Sit to/from Stand Sit to Stand: Min assist         General transfer comment: Initial stand from recliner with minA for HHA to elevate trunk; additional sit<>stand trial from EOB with rollator, minA for trunk elevation and stability, increased time and effort  with cues for hand placement and educ on locking/unlocking rollator brakes  Ambulation/Gait Ambulation/Gait assistance: Min guard Gait Distance (Feet): 4 Feet (+ 66') Assistive device: 1 person hand held assist;4-wheeled walker Gait Pattern/deviations: Step-through pattern;Decreased stride length;Antalgic;Trunk flexed Gait velocity: Decreased   General Gait Details: Initial ambulation from recliner to EOB with minA for HHA to maintain balance; additional gait trial with rollator, min guard for balance; frequent verbal cues for maintaining closer proximity to and upright posture with rollator; pt reports further distance limited by LLE pain/soreness   Stairs             Wheelchair Mobility    Modified Rankin (Stroke Patients Only)       Balance Overall balance assessment: Needs assistance Sitting-balance support: No upper extremity supported;Feet supported Sitting balance-Leahy Scale: Good     Standing balance support: No upper extremity supported;During functional activity Standing balance-Leahy Scale: Fair Standing balance comment: can static stand without UE support; static and dynamic stability improved with UE support                            Cognition Arousal/Alertness: Awake/alert Behavior During Therapy: WFL for tasks assessed/performed Overall Cognitive Status: No family/caregiver present to determine baseline cognitive functioning Area of Impairment: Attention;Safety/judgement;Problem solving;Awareness                   Current Attention Level: Sustained;Selective   Following Commands: Follows one step commands consistently;Follows one step commands with increased time Safety/Judgement: Decreased awareness of deficits;Decreased awareness of safety Awareness: Emergent Problem Solving: Slow processing;Requires verbal cues General Comments: pt following commands with increased time, min cueing for safety  and task attention       Exercises      General Comments        Pertinent Vitals/Pain Pain Assessment: Faces Faces Pain Scale: Hurts little more Pain Location: LLE (thigh) Pain Descriptors / Indicators: Discomfort;Sore Pain Intervention(s): Monitored during session    Home Living                      Prior Function            PT Goals (current goals can now be found in the care plan section) Acute Rehab PT Goals Patient Stated Goal: to improve strength and return to independence Progress towards PT goals: Progressing toward goals    Frequency    Min 3X/week      PT Plan Current plan remains appropriate    Co-evaluation              AM-PAC PT "6 Clicks" Mobility   Outcome Measure  Help needed turning from your back to your side while in a flat bed without using bedrails?: A Little Help needed moving from lying on your back to sitting on the side of a flat bed without using bedrails?: A Little Help needed moving to and from a bed to a chair (including a wheelchair)?: A Little Help needed standing up from a chair using your arms (e.g., wheelchair or bedside chair)?: A Little Help needed to walk in hospital room?: A Little Help needed climbing 3-5 steps with a railing? : A Lot 6 Click Score: 17    End of Session   Activity Tolerance: Patient tolerated treatment well;Patient limited by fatigue Patient left: in bed;with call bell/phone within reach;with bed alarm set Nurse Communication: Mobility status PT Visit Diagnosis: Other abnormalities of gait and mobility (R26.89);Muscle weakness (generalized) (M62.81)     Time: 0998-3382 PT Time Calculation (min) (ACUTE ONLY): 26 min  Charges:  $Gait Training: 8-22 mins $Therapeutic Activity: 8-22 mins                     Mabeline Caras, PT, DPT Acute Rehabilitation Services  Pager [phone] Office [place] 04/15/2021, 1:59 PM

## 2021-04-15 NOTE — Care Management Important Message (Signed)
Important Message  Patient Details  Name: Thomas Duarte MRN: [id number] Date of Birth: [birth year]   Medicare Important Message Given:  Yes     Shelda Altes 04/15/2021, 9:44 AM

## 2021-04-15 NOTE — TOC Progression Note (Signed)
Transition of Care [facility]) - Progression Note    Patient Details  Name: Thomas Duarte MRN: [id number] Date of Birth: 24-Jan-1941  Transition of Care [facility]) CM/SW [place], [place] Phone Number: 04/15/2021, 6:32 PM  Clinical Narrative:    CSW contacted pt spouse to follow up on SNF choice. Pt wife was unavailable CSW left a VM with Snf offers. CSW also contacted [facility] as tis is pt first choice and convenient for the pt spouse. CSW admissions were unavailable CSW left a VM and will follow up with in the morning.    Expected Discharge Plan: [place] Barriers to Discharge: Continued Medical Work up  Expected Discharge Plan and Services Expected Discharge Plan: [place] In-house Referral: Clinical Social Work   Post Acute Care Choice: [place] Living arrangements for the past 2 months: Single Family Home                                       Social Determinants of Health (SDOH) Interventions    Readmission Risk Interventions No flowsheet data found.

## 2021-04-15 NOTE — Progress Notes (Signed)
   04/15/21 1450  Mobility  Activity Refused mobility

## 2021-04-15 NOTE — Progress Notes (Signed)
Occupational Therapy Treatment Patient Details Name: Thomas Duarte MRN: [id number] DOB: 06/19/1941 Today's Date: 04/15/2021    History of present illness Pt is an 80 y.o. male admitted 04/10/21 with c/o worsening BLE weakness, urinary incontinence; of note, pt recently started prostate CA treatment (completed 4 wks of radiation 2 wks prior). Workup for AKI on CKD 3.  8/26 Lumbar MRI showed degenerative disease.  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   OT comments  Patient progressing well towards OT goals. Completing bed mobility with supervision, toilet transfers using RW with min assist and grooming standing at sink with min guard (seated to complete task with supervision due to decreased activity tolerance).  Pt requires increased time for all tasks, cueing for problem solving, attention and safety awareness.  Will follow acutely.    Follow Up Recommendations  SNF;Supervision/Assistance - 24 hour    Equipment Recommendations  Other (comment) (TBD)    Recommendations for Other Services PT consult    Precautions / Restrictions Precautions Precautions: Fall Restrictions Weight Bearing Restrictions: No       Mobility Bed Mobility Overal bed mobility: Needs Assistance Bed Mobility: Supine to Sit     Supine to sit: Supervision;HOB elevated     General bed mobility comments: HOB elevated, use of rails, and increased time but no assist required    Transfers Overall transfer level: Needs assistance Equipment used: Rolling walker (2 wheeled) Transfers: Sit to/from Stand Sit to Stand: Min assist         General transfer comment: min assist from low surfance, min guard from elevated, cueing for hand placement and forward translation    Balance Overall balance assessment: Needs assistance Sitting-balance support: No upper extremity supported;Feet supported Sitting balance-Leahy Scale: Good     Standing  balance support: No upper extremity supported;During functional activity Standing balance-Leahy Scale: Fair Standing balance comment: BUE dynamically, min guard statically without UE support but limited tolerance                           ADL either performed or assessed with clinical judgement   ADL Overall ADL's : Needs assistance/impaired     Grooming: Oral care;Min guard;Standing Grooming Details (indicate cue type and reason): min guard in standing, seated to complete due to fatigue with supervision             Lower Body Dressing: Maximal assistance;Sit to/from stand   Toilet Transfer: Minimal assistance;Ambulation;RW Toilet Transfer Details (indicate cue type and reason): simulated in room         Functional mobility during ADLs: Minimal assistance;Rolling walker;Cueing for safety       Vision       Perception     Praxis      Cognition Arousal/Alertness: Awake/alert Behavior During Therapy: WFL for tasks assessed/performed Overall Cognitive Status: No family/caregiver present to determine baseline cognitive functioning Area of Impairment: Attention;Safety/judgement;Problem solving;Awareness                   Current Attention Level: Sustained   Following Commands: Follows one step commands consistently;Follows one step commands with increased time;Follows multi-step commands inconsistently Safety/Judgement: Decreased awareness of deficits;Decreased awareness of safety Awareness: Emergent Problem Solving: Slow processing;Requires verbal cues General Comments: pt following commands with increased time, min cueing for safety and task attention        Exercises     Shoulder Instructions  General Comments      Pertinent Vitals/ Pain       Pain Assessment: Faces Faces Pain Scale: Hurts a little bit Pain Location: LLE Pain Descriptors / Indicators: Discomfort Pain Intervention(s): Monitored during session  Home Living                                           Prior Functioning/Environment              Frequency  Min 2X/week        Progress Toward Goals  OT Goals(current goals can now be found in the care plan section)  Progress towards OT goals: Progressing toward goals  Acute Rehab OT Goals Patient Stated Goal: to improve strength and return to independence OT Goal Formulation: With patient  Plan Discharge plan remains appropriate;Frequency remains appropriate    Co-evaluation                 AM-PAC OT "6 Clicks" Daily Activity     Outcome Measure   Help from another person eating meals?: A Little Help from another person taking care of personal grooming?: A Little Help from another person toileting, which includes using toliet, bedpan, or urinal?: Total Help from another person bathing (including washing, rinsing, drying)?: A Lot Help from another person to put on and taking off regular upper body clothing?: A Little Help from another person to put on and taking off regular lower body clothing?: A Lot 6 Click Score: 14    End of Session Equipment Utilized During Treatment: Rolling walker  OT Visit Diagnosis: Other abnormalities of gait and mobility (R26.89);Muscle weakness (generalized) (M62.81)   Activity Tolerance Patient tolerated treatment well   Patient Left in chair;with call bell/phone within reach;with chair alarm set   Nurse Communication Mobility status        Time: 7026-3785 OT Time Calculation (min): 32 min  Charges: OT General Charges $OT Visit: 1 Visit OT Treatments $Self Care/Home Management : 23-37 mins  Jolaine Artist, OT Acute Rehabilitation Services Pager [phone] Office [place] 04/15/2021, 10:29 AM

## 2021-04-15 NOTE — Progress Notes (Signed)
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  He recently had prostate cancer treatment and completed 4 weeks of radiation to weeks ago.  He also is complaining of urinary incontinence which has progressively worsened.  Recently started wearing diapers.  Last 2 weeks, experience extreme weakness of the muscles around the pelvis and bilateral thighs.  He has recently been unable to walk because of bilateral lower extremity weakness.  On presentation.,he  was found to have worsening of his renal function with creatinine in the range of 3.  Baseline creatinine around 2.  Patient admitted for further work-up.  Lumbar MRI showed degenerative disease.  PT/OT recommended skilled nursing facility on discharge.  Medically stable for discharge as soon as possible   Assessment & Plan:   Active Problems:   AKI (acute kidney injury) ([place])   AKI on CKD stage IIIb: Baseline creatinine around 2.On presentation appeared hypovolemic.  Takes Lasix at home, suspected overdiuresis. Renal ultrasound did not show any obstruction or hydronephrosis. Treated with IV fluids,now D/Ced.  Kidney function gradually improving.  Urine sodium of 50 We will probably resume Lasix at 40 mg daily  Generalized weakness/bilateral lower extremity weakness, ambulatory dysfunction: He was bilateral lower extremity weakness.  PT/OT evaluation requested,recommended SNF.  He lives with his wife.  Uses walker/cane for ambulation. 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  We recommend neurosurgery follow-up as an outpatient.Denies any backpain  Urinary incontinence: UA not so stable for UTI denies dysuria..  Likely associated with prostate cancer/radiation treatment.  We recommend follow-up with urology as an outpatient.  Elevated troponin: Denies any chest pain.  Likely secondary to supply demand ischemia from severe AKI.  Chronic diastolic congestive heart failure: Takes Lasix at home.  Probably overt diuresed when we saw the patient during admission.  Lasix held.  Started on IV fluids.Fluids D/ced, will resume Lasix 40 mg daily from tomorrow depending upon the BMP.  Hypertension: Presented with hypertension. Now BP stable. Continue beta-blocker, as needed hydralazine.Home lisinopril on hold due to AKI  History of frequent PVCs: Beta-blocker to be continued .  He follows with cardiology at [facility]  Chronic normocytic anemia: Currently hemoglobin is stable.  Diabetes type 2: Continue sliding scale for now.  Takes metformin at home.  Hemoglobin A1c of 7.1  History of prostate cancer: Follows with urology.  Recently completed radiation therapy.           DVT prophylaxis:Heparin [place] Code Status: Full Family Communication: Called wife on phone for update several times, call not received Status is: Inpatient  Remains inpatient appropriate because:Inpatient level of care appropriate due to severity of illness  Dispo: The patient is from: Home              Anticipated d/c is to: Skilled nursing facility              Patient currently is medically stable for discharge   Difficult to place patient No     Consultants:  None  Procedures: None  Antimicrobials:  Anti-infectives (From admission, onward)    None       Subjective:  Patient seen and examined at the bedside  this morning.  Hemodynamically stable.  Remains comfortable.  Lying in bed.  No new complaints  Objective: Vitals:   04/14/21 2002 04/15/21 0603 04/15/21 0912 04/15/21 1147  BP: (!) 143/63 (!) 159/49 (!) 168/48 (!) 145/66  Pulse: (!) 58 64 65 64  Resp: 20 16 20 18   Temp: 98.4 F (36.9 C) 98.7 F (37.1 C) 97.7 F (36.5 C) (!) 97.5 F (36.4 C)  TempSrc: Oral Oral Oral Oral  SpO2: 100% 98% 99% 98%  Weight:  96.2 kg    Height:        Intake/Output Summary (Last 24 hours) at 04/15/2021 1200 Last data filed at 04/15/2021 0600 Gross per 24 hour  Intake 250 ml  Output 1100 ml  Net -850 ml   Filed Weights   04/13/21 0442 04/14/21 0507 04/15/21 0603  Weight: 95.4 kg 95.4 kg 96.2 kg    Examination:  General exam: Overall comfortable, not in distress, pleasant elderly male HEENT: PERRL Respiratory system:  no wheezes or crackles  Cardiovascular system: S1 & S2 heard, RRR.  Gastrointestinal system: Abdomen is nondistended, soft and nontender. Central nervous system: Alert and oriented Extremities: No edema, no clubbing ,no cyanosis Skin: No rashes, no ulcers,no icterus         Data Reviewed: I have personally reviewed following labs and imaging studies  CBC: Recent Labs  Lab 04/10/21 1438 04/10/21 1811 04/11/21 0336  WBC 6.5  --  7.1  NEUTROABS 4.7  --   --   HGB 9.0* 8.8* 9.1*  HCT 27.8* 26.0* 28.2*  MCV 99.3  --  97.6  PLT 144*  --  443*   Basic Metabolic Panel: Recent Labs  Lab 04/10/21 1438 04/10/21 1535 04/10/21 1811 04/11/21 0336 04/12/21 0413 04/13/21 0405 04/15/21 0237  NA 141  --  146* 142 139 138 136  K 4.4  --  4.4 4.5 4.0 4.1 4.0  CL 118*  --   --  116* 116* 115* 111  CO2 17*  --   --  16* 16* 16* 16*  GLUCOSE 152*  --   --  147* 125* 134* 111*  BUN 84*  --   --  77* 69* 59* 53*  CREATININE 3.46*  --   --  3.08* 2.59* 2.45* 2.27*  CALCIUM 9.1  --   --  9.2 8.7* 9.0 8.8*  MG  --  2.1  --   --   --   --   --    GFR: Estimated Creatinine  Clearance: 31.2 mL/min (A) (by C-G formula based on SCr of 2.27 mg/dL (H)). Liver Function Tests: Recent Labs  Lab 04/10/21 1438  AST 13*  ALT 11  ALKPHOS 89  BILITOT 0.3  PROT 5.8*  ALBUMIN 2.9*   Recent Labs  Lab 04/10/21 1438  LIPASE 42   No results for input(s): AMMONIA in the last 168 hours. Coagulation Profile: No results for input(s): INR, PROTIME in the last 168 hours. Cardiac Enzymes: Recent Labs  Lab 04/10/21 1438  CKTOTAL 62   BNP (last 3 results) No results for input(s): PROBNP in the last 8760 hours. HbA1C: No results for input(s): HGBA1C in the last 72 hours.  CBG: Recent Labs  Lab 04/14/21 1106 04/14/21 1706 04/14/21 2130 04/15/21 0603 04/15/21 1144  GLUCAP 148* 155* 112* 111* 159*  Lipid Profile: No results for input(s): CHOL, HDL, LDLCALC, TRIG, CHOLHDL, LDLDIRECT in the last 72 hours. Thyroid Function Tests: No results for input(s): TSH, T4TOTAL, FREET4, T3FREE, THYROIDAB in the last 72 hours.  Anemia Panel: No results for input(s): VITAMINB12, FOLATE, FERRITIN, TIBC, IRON, RETICCTPCT in the last 72 hours.  Sepsis Labs: No results for input(s): PROCALCITON, LATICACIDVEN in the last 168 hours.  Recent Results (from the past 240 hour(s))  Resp Panel by RT-PCR (Flu A&B, Covid) Nasopharyngeal Swab     Status: None   Collection Time: 04/10/21  4:26 PM   Specimen: Nasopharyngeal Swab; Nasopharyngeal(NP) swabs in vial transport medium  Result Value Ref Range Status   SARS Coronavirus 2 by RT PCR NEGATIVE NEGATIVE Final    Comment: (NOTE) SARS-CoV-2 target nucleic acids are NOT DETECTED.  The SARS-CoV-2 RNA is generally detectable in upper respiratory specimens during the acute phase of infection. The lowest concentration of SARS-CoV-2 viral copies this assay can detect is 138 copies/mL. A negative result does not preclude SARS-Cov-2 infection and should not be used as the sole basis for treatment or other patient management decisions. A  negative result may occur with  improper specimen collection/handling, submission of specimen other than nasopharyngeal swab, presence of viral mutation(s) within the areas targeted by this assay, and inadequate number of viral copies(<138 copies/mL). A negative result must be combined with clinical observations, patient history, and epidemiological information. The expected result is Negative.  Fact Sheet for Patients:  EntrepreneurPulse.com.au  Fact Sheet for Healthcare Providers:  IncredibleEmployment.be  This test is no t yet approved or cleared by the [place] FDA and  has been authorized for detection and/or diagnosis of SARS-CoV-2 by FDA under an Emergency Use Authorization (EUA). This EUA will remain  in effect (meaning this test can be used) for the duration of the COVID-19 declaration under Section 564(b)(1) of the Act, 21 U.S.C.section 360bbb-3(b)(1), unless the authorization is terminated  or revoked sooner.       Influenza A by PCR NEGATIVE NEGATIVE Final   Influenza B by PCR NEGATIVE NEGATIVE Final    Comment: (NOTE) The Xpert Xpress SARS-CoV-2/FLU/RSV plus assay is intended as an aid in the diagnosis of influenza from Nasopharyngeal swab specimens and should not be used as a sole basis for treatment. Nasal washings and aspirates are unacceptable for Xpert Xpress SARS-CoV-2/FLU/RSV testing.  Fact Sheet for Patients: EntrepreneurPulse.com.au  Fact Sheet for Healthcare Providers: IncredibleEmployment.be  This test is not yet approved or cleared by the [place] FDA and has been authorized for detection and/or diagnosis of SARS-CoV-2 by FDA under an Emergency Use Authorization (EUA). This EUA will remain in effect (meaning this test can be used) for the duration of the COVID-19 declaration under Section 564(b)(1) of the Act, 21 U.S.C. section 360bbb-3(b)(1), unless the authorization  is terminated or revoked.  Performed at [facility], [place] [address] [place], [place] [address]          Radiology Studies: No results found.      Scheduled Meds:  aspirin  325 mg Oral Daily   clopidogrel  75 mg Oral QHS   heparin  5,000 Units Subcutaneous Q12H   insulin aspart  0-9 Units Subcutaneous TID WC   metoprolol succinate  25 mg Oral Daily   sodium bicarbonate  650 mg Oral TID   tamsulosin  0.8 mg Oral QHS   Continuous Infusions:     LOS: 5 days    Time spent: 25 mins.More than 50%  of that time was spent in counseling and/or coordination of care.      Shelly Coss, MD [facility] P8/30/2022, 12:00 PM

## 2021-04-16 DIAGNOSIS — I10 Essential (primary) hypertension: Secondary | ICD-10-CM | POA: Diagnosis not present

## 2021-04-16 DIAGNOSIS — R002 Palpitations: Secondary | ICD-10-CM | POA: Diagnosis not present

## 2021-04-16 LAB — GLUCOSE, CAPILLARY
Glucose-Capillary: 111 mg/dL — ABNORMAL HIGH (ref 70–99)
Glucose-Capillary: 134 mg/dL — ABNORMAL HIGH (ref 70–99)
Glucose-Capillary: 156 mg/dL — ABNORMAL HIGH (ref 70–99)
Glucose-Capillary: 130 mg/dL — ABNORMAL HIGH (ref 70–99)

## 2021-04-16 MED ORDER — SODIUM BICARBONATE 650 MG PO TABS: 650.0000 mg | ORAL_TABLET | Freq: Three times a day (TID) | ORAL | Status: AC

## 2021-04-16 MED ORDER — FUROSEMIDE 40 MG PO TABS: 40.0000 mg | ORAL_TABLET | Freq: Every day | ORAL | Status: AC

## 2021-04-16 MED ORDER — AMLODIPINE BESYLATE 10 MG PO TABS
10.0000 mg | ORAL_TABLET | Freq: Every day | ORAL | Status: DC
  Administered 2021-04-16 – 2021-04-22 (×6): 10 mg via ORAL
  Filled 2021-04-16 (×7): qty 1

## 2021-04-16 MED ORDER — AMLODIPINE BESYLATE 10 MG PO TABS: 10.0000 mg | ORAL_TABLET | Freq: Every day | ORAL | Status: AC

## 2021-04-16 NOTE — Discharge Summary (Signed)
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  He recently had prostate cancer treatment and completed 4 weeks of radiation to weeks ago.  He also is complaining of urinary incontinence which has progressively worsened.He had  experienced extreme weakness of the muscles around the pelvis and bilateral thighs.  He has recently been unable to walk because of bilateral lower extremity weakness.  On presentation.,he  was found to have worsening of his renal function with creatinine in the range of 3.  Baseline creatinine around 2.  Patient admitted for further work-up.  Lumbar MRI showed degenerative disease,now acute changes.Kidney function has returned to baseline. PT/OT recommended skilled nursing facility on discharge.  Medically stable for discharge as soon as possible.  Following problems were addressed during his hospitalization:   AKI on CKD stage IIIb: Baseline creatinine around 2.On presentation appeared hypovolemic.  Takes Lasix at home, suspected overdiuresis. Renal ultrasound did not show any obstruction or hydronephrosis. Treated with IV fluids,now D/Ced.  Kidney function gradually improving. We will probably resume Lasix at 40 mg daily.We recommend to follow up with nephrology as an outpatient,sent referral to [facility]   Generalized weakness/bilateral lower extremity weakness, ambulatory dysfunction: He was  bilateral lower extremity weakness.  PT/OT evaluation requested,recommended SNF.  He lives with his wife.  Uses walker/cane for ambulation. 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  We recommend neurosurgery follow-up as an outpatient.Denies any back pain at present    Urinary incontinence: UA not impressive  for UTI ,denies dysuria..  Likely associated with prostate cancer/radiation treatment.  We recommend follow-up with urology as an outpatient.   Elevated troponin: Denies any chest pain.  Likely secondary to supply demand ischemia from severe AKI.No further work up   Chronic diastolic congestive heart failure: Takes Lasix at home.  Probably overt diuresed when we saw the patient during admission.  Lasix held now resumed once daily.     Hypertension: Presented with hypertension. . Continue beta-blocker, added amlodipine.Home lisinopril stopped   History of frequent PVCs: Beta-blocker to be continued .  He follows with cardiology at [facility]   Chronic normocytic anemia: Currently hemoglobin is stable.   Diabetes type 2:   Takes metformin at home.  Hemoglobin A1c of 7.1   History of prostate cancer: Follows with urology.  Recently completed radiation therapy.     Discharge Diagnoses:  Active Problems:   AKI (acute kidney injury) [facility])    Discharge Instructions  Discharge Instructions     Diet - low sodium heart healthy   Complete by: As directed    Discharge instructions   Complete by: As directed    1)Please take prescribed medications as instructed 2)Do a BMP test in a week   Increase activity slowly   Complete by: As directed       Allergies as of 04/16/2021   No Known Allergies  Medication List     STOP taking these medications     lisinopril 40 MG tablet Commonly known as: ZESTRIL       TAKE these medications    amLODipine 10 MG tablet Commonly known as: NORVASC Take 1 tablet (10 mg total) by mouth daily.   aspirin 325 MG tablet Take 325 mg by mouth daily.   clopidogrel 75 MG tablet Commonly known as: PLAVIX Take 75 mg by mouth at bedtime.   furosemide 40 MG tablet Commonly known as: LASIX Take 1 tablet (40 mg total) by mouth daily. What changed: when to take this   Glycerin-Hypromellose-PEG 400 0.2-0.2-1 % Soln Place 1 drop into both eyes 3 (three) times daily as needed (dry eyes).   ibuprofen 200 MG tablet Commonly known as: ADVIL Take 400 mg by mouth at bedtime as needed for mild pain.   Klor-Con M10 10 MEQ tablet Generic drug: potassium chloride Take 10 mEq by mouth 2 (two) times daily.   metFORMIN 500 MG tablet Commonly known as: GLUCOPHAGE Take 500 mg by mouth daily with breakfast.   metoprolol succinate 25 MG 24 hr tablet Commonly known as: TOPROL-XL Take 25 mg by mouth daily.   OneTouch Delica Plus [id number] Misc SMARTSIG:1 Strip(s) Topical Daily   OneTouch Verio test strip Generic drug: glucose blood 1 each daily.   OVER THE COUNTER MEDICATION Apply 1 application topically 2 (two) times daily as needed (pain). Real Time Pain Relief   sodium bicarbonate 650 MG tablet Take 1 tablet (650 mg total) by mouth 3 (three) times daily.   tamsulosin 0.4 MG Caps capsule Commonly known as: FLOMAX Take 0.8 mg by mouth at bedtime.   tobramycin 0.3 % ophthalmic solution Commonly known as: TOBREX Place 1 drop into both eyes every 4 (four) hours as needed (after eye injections until healed).        Follow-up Information     Monico Blitz, MD. Schedule an appointment as soon as possible for a visit in 2 week(s).   Specialty: Internal Medicine Contact information: [place] [phone]                No Known  Allergies  Consultations: None   Procedures/Studies: DG Chest 2 View  Result Date: 04/10/2021 CLINICAL DATA:  Shortness of breath and weakness over the last week. EXAM: CHEST - 2 VIEW COMPARISON:  None. FINDINGS: Heart size upper limits of normal. Aortic atherosclerosis and tortuosity. The lungs are clear. The vascularity is normal. No evidence of heart failure. No effusion. No significant bone finding. IMPRESSION: No active disease. Heart size upper limits of normal. Aortic atherosclerosis. Electronically Signed   By: Nelson Chimes M.D.   On: 04/10/2021 15:12   MR LUMBAR SPINE WO CONTRAST  Result Date: 04/11/2021 CLINICAL DATA:  Lumbar radiculopathy, no red flags. Bilateral lower extremity weakness. Recent radiation therapy for prostate cancer. Urinary incontinence. EXAM: MRI LUMBAR SPINE WITHOUT CONTRAST TECHNIQUE: Multiplanar, multisequence MR imaging of the lumbar spine was performed. No intravenous contrast was administered. COMPARISON:  CT abdomen and pelvis 10/02/2020 FINDINGS: Segmentation:  Standard. Alignment: Unchanged facet mediated anterolisthesis of L4 on L5 measuring 5 mm. Vertebrae: No fracture or suspicious marrow lesion. Mild bilateral facet edema at L4-5. Conus medullaris and cauda equina: Conus extends to the L1 level. Conus and cauda equina appear normal. Paraspinal and other soft tissues: Partial imaging of small T2 hyperintense lesions in both kidneys measuring up to 1.4 cm, likely cysts. Disc levels: Disc desiccation throughout  the lumbar spine. Preserved disc space heights. L1-2: Negative. L2-3: Disc bulging and mild facet and ligamentum flavum hypertrophy result in mild right greater than left lateral recess stenosis without significant spinal or neural foraminal stenosis. L3-4: Disc bulging and mild facet and ligamentum flavum hypertrophy result in mild-to-moderate bilateral lateral recess stenosis and mild right neural foraminal stenosis without significant spinal stenosis.  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 Potential left L4 nerve root impingement in the neural foramen. L5-S1: Mild disc bulging, severe facet hypertrophy, and a 4 mm synovial cyst projecting anterior from the right facet joint into the superior aspect of the right neural foramen result in mild bilateral neural foraminal stenosis without spinal stenosis. IMPRESSION: 1. 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 2. Mild-to-moderate lateral recess stenosis and mild right neural foraminal stenosis at L3-4. Electronically Signed   By: Logan Bores M.D.   On: 04/11/2021 17:18   US RENAL  Result Date: 04/10/2021 CLINICAL DATA:  Acute renal insufficiency EXAM: RENAL / URINARY TRACT ULTRASOUND COMPLETE COMPARISON:  None. FINDINGS: Right Kidney: Renal measurements: 10.9 x 5.3 x 4.5 cm = volume: 135.1 mL. Echogenicity within normal limits. No mass or hydronephrosis visualized. Left Kidney: Renal measurements: 10.8 x 5.9 x 4.1 cm = volume: 137.9 mL. Echogenicity within normal limits. No mass or hydronephrosis visualized. Bladder: Appears normal for degree of bladder distention. Other: None. IMPRESSION: 1. Unremarkable renal ultrasound. Electronically Signed   By: Randa Ngo M.D.   On: 04/10/2021 18:18      Subjective: Patient seen and examined the bedside this morning.  Hemodynamically stable for discharge.  I called his wife and updated about our discharge plan  Discharge Exam: Vitals:   04/15/21 2003 04/16/21 0451  BP: (!) 155/47  (!) 164/40  Pulse: (!) 48 73  Resp: 19 17  Temp: 98.6 F (37 C) 97.7 F (36.5 C)  SpO2: 99%    Vitals:   04/15/21 1147 04/15/21 1522 04/15/21 2003 04/16/21 0451  BP: (!) 145/66 (!) 141/78 (!) 155/47 (!) 164/40  Pulse: 64 65 (!) 48 73  Resp: 18 20 19 17   Temp: (!) 97.5 F (36.4 C) 97.8 F (36.6 C) 98.6 F (37 C) 97.7 F (36.5 C)  TempSrc: Oral Oral Oral Oral  SpO2: 98% 100% 99%   Weight:    95.8 kg  Height:        General: Pt is alert, awake, not in acute distress Cardiovascular: RRR, S1/S2 +, no rubs, no gallops Respiratory: CTA bilaterally, no wheezing, no rhonchi Abdominal: Soft, NT, ND, bowel sounds + Extremities: no edema, no cyanosis    The results of significant diagnostics from this hospitalization (including imaging, microbiology, ancillary and laboratory) are listed below for reference.     Microbiology: Recent Results (from the past 240 hour(s))  Resp Panel by RT-PCR (Flu A&B, Covid) Nasopharyngeal Swab     Status: None   Collection Time: 04/10/21  4:26 PM   Specimen: Nasopharyngeal Swab; Nasopharyngeal(NP) swabs in vial transport medium  Result Value Ref Range Status   SARS Coronavirus 2 by RT PCR NEGATIVE NEGATIVE Final  Comment: (NOTE) SARS-CoV-2 target nucleic acids are NOT DETECTED.  The SARS-CoV-2 RNA is generally detectable in upper respiratory specimens during the acute phase of infection. The lowest concentration of SARS-CoV-2 viral copies this assay can detect is 138 copies/mL. A negative result does not preclude SARS-Cov-2 infection and should not be used as the sole basis for treatment or other patient management decisions. A negative result may occur with  improper specimen collection/handling, submission of specimen other than nasopharyngeal swab, presence of viral mutation(s) within the areas targeted by this assay, and inadequate number of viral copies(<138 copies/mL). A negative result must be combined with clinical observations,  patient history, and epidemiological information. The expected result is Negative.  Fact Sheet for Patients:  EntrepreneurPulse.com.au  Fact Sheet for Healthcare Providers:  IncredibleEmployment.be  This test is no t yet approved or cleared by the [place] FDA and  has been authorized for detection and/or diagnosis of SARS-CoV-2 by FDA under an Emergency Use Authorization (EUA). This EUA will remain  in effect (meaning this test can be used) for the duration of the COVID-19 declaration under Section 564(b)(1) of the Act, 21 U.S.C.section 360bbb-3(b)(1), unless the authorization is terminated  or revoked sooner.       Influenza A by PCR NEGATIVE NEGATIVE Final   Influenza B by PCR NEGATIVE NEGATIVE Final    Comment: (NOTE) The Xpert Xpress SARS-CoV-2/FLU/RSV plus assay is intended as an aid in the diagnosis of influenza from Nasopharyngeal swab specimens and should not be used as a sole basis for treatment. Nasal washings and aspirates are unacceptable for Xpert Xpress SARS-CoV-2/FLU/RSV testing.  Fact Sheet for Patients: EntrepreneurPulse.com.au  Fact Sheet for Healthcare Providers: IncredibleEmployment.be  This test is not yet approved or cleared by the [place] FDA and has been authorized for detection and/or diagnosis of SARS-CoV-2 by FDA under an Emergency Use Authorization (EUA). This EUA will remain in effect (meaning this test can be used) for the duration of the COVID-19 declaration under Section 564(b)(1) of the Act, 21 U.S.C. section 360bbb-3(b)(1), unless the authorization is terminated or revoked.  Performed at [facility], [place] [address], [place], [place] [address]      Labs: BNP (last 3 results) Recent Labs    04/10/21 1535  BNP 409.7*   Basic Metabolic Panel: Recent Labs  Lab 04/10/21 1438 04/10/21 1535 04/10/21 1811 04/11/21 0336 04/12/21 0413  04/13/21 0405 04/15/21 0237  NA 141  --  146* 142 139 138 136  K 4.4  --  4.4 4.5 4.0 4.1 4.0  CL 118*  --   --  116* 116* 115* 111  CO2 17*  --   --  16* 16* 16* 16*  GLUCOSE 152*  --   --  147* 125* 134* 111*  BUN 84*  --   --  77* 69* 59* 53*  CREATININE 3.46*  --   --  3.08* 2.59* 2.45* 2.27*  CALCIUM 9.1  --   --  9.2 8.7* 9.0 8.8*  MG  --  2.1  --   --   --   --   --    Liver Function Tests: Recent Labs  Lab 04/10/21 1438  AST 13*  ALT 11  ALKPHOS 89  BILITOT 0.3  PROT 5.8*  ALBUMIN 2.9*   Recent Labs  Lab 04/10/21 1438  LIPASE 42   No results for input(s): AMMONIA in the last 168 hours. CBC: Recent Labs  Lab 04/10/21 1438 04/10/21 1811 04/11/21 0336  WBC  6.5  --  7.1  NEUTROABS 4.7  --   --   HGB 9.0* 8.8* 9.1*  HCT 27.8* 26.0* 28.2*  MCV 99.3  --  97.6  PLT 144*  --  142*   Cardiac Enzymes: Recent Labs  Lab 04/10/21 1438  CKTOTAL 62   BNP: Invalid input(s): POCBNP CBG: Recent Labs  Lab 04/15/21 1144 04/15/21 1640 04/15/21 2058 04/16/21 0615 04/16/21 1158  GLUCAP 159* 123* 102* 111* 134*   D-Dimer No results for input(s): DDIMER in the last 72 hours. Hgb A1c No results for input(s): HGBA1C in the last 72 hours. Lipid Profile No results for input(s): CHOL, HDL, LDLCALC, TRIG, CHOLHDL, LDLDIRECT in the last 72 hours. Thyroid function studies No results for input(s): TSH, T4TOTAL, T3FREE, THYROIDAB in the last 72 hours.  Invalid input(s): FREET3 Anemia work up No results for input(s): VITAMINB12, FOLATE, FERRITIN, TIBC, IRON, RETICCTPCT in the last 72 hours. Urinalysis    Component Value Date/Time   COLORURINE YELLOW 04/11/2021 0814   APPEARANCEUR HAZY (A) 04/11/2021 0814   APPEARANCEUR Clear 01/01/2021 1448   LABSPEC 1.010 04/11/2021 0814   PHURINE 6.0 04/11/2021 0814   GLUCOSEU NEGATIVE 04/11/2021 0814   HGBUR NEGATIVE 04/11/2021 0814   BILIRUBINUR NEGATIVE 04/11/2021 0814   BILIRUBINUR Negative 01/01/2021 [place] 04/11/2021 0814   PROTEINUR NEGATIVE 04/11/2021 0814   NITRITE NEGATIVE 04/11/2021 0814   LEUKOCYTESUR NEGATIVE 04/11/2021 0814   Sepsis Labs Invalid input(s): PROCALCITONIN,  WBC,  LACTICIDVEN Microbiology Recent Results (from the past 240 hour(s))  Resp Panel by RT-PCR (Flu A&B, Covid) Nasopharyngeal Swab     Status: None   Collection Time: 04/10/21  4:26 PM   Specimen: Nasopharyngeal Swab; Nasopharyngeal(NP) swabs in vial transport medium  Result Value Ref Range Status   SARS Coronavirus 2 by RT PCR NEGATIVE NEGATIVE Final    Comment: (NOTE) SARS-CoV-2 target nucleic acids are NOT DETECTED.  The SARS-CoV-2 RNA is generally detectable in upper respiratory specimens during the acute phase of infection. The lowest concentration of SARS-CoV-2 viral copies this assay can detect is 138 copies/mL. A negative result does not preclude SARS-Cov-2 infection and should not be used as the sole basis for treatment or other patient management decisions. A negative result may occur with  improper specimen collection/handling, submission of specimen other than nasopharyngeal swab, presence of viral mutation(s) within the areas targeted by this assay, and inadequate number of viral copies(<138 copies/mL). A negative result must be combined with clinical observations, patient history, and epidemiological information. The expected result is Negative.  Fact Sheet for Patients:  EntrepreneurPulse.com.au  Fact Sheet for Healthcare Providers:  IncredibleEmployment.be  This test is no t yet approved or cleared by the [place] FDA and  has been authorized for detection and/or diagnosis of SARS-CoV-2 by FDA under an Emergency Use Authorization (EUA). This EUA will remain  in effect (meaning this test can be used) for the duration of the COVID-19 declaration under Section 564(b)(1) of the Act, 21 U.S.C.section 360bbb-3(b)(1), unless the authorization  is terminated  or revoked sooner.       Influenza A by PCR NEGATIVE NEGATIVE Final   Influenza B by PCR NEGATIVE NEGATIVE Final    Comment: (NOTE) The Xpert Xpress SARS-CoV-2/FLU/RSV plus assay is intended as an aid in the diagnosis of influenza from Nasopharyngeal swab specimens and should not be used as a sole basis for treatment. Nasal washings and aspirates are unacceptable for Xpert Xpress SARS-CoV-2/FLU/RSV testing.  Fact Sheet for Patients:  EntrepreneurPulse.com.au  Fact Sheet for Healthcare Providers: IncredibleEmployment.be  This test is not yet approved or cleared by the [place] FDA and has been authorized for detection and/or diagnosis of SARS-CoV-2 by FDA under an Emergency Use Authorization (EUA). This EUA will remain in effect (meaning this test can be used) for the duration of the COVID-19 declaration under Section 564(b)(1) of the Act, 21 U.S.C. section 360bbb-3(b)(1), unless the authorization is terminated or revoked.  Performed at [facility], [place] [address] [place], [place] [address]     Please note: You were cared for by a hospitalist during your hospital stay. Once you are discharged, your primary care physician will handle any further medical issues. Please note that NO REFILLS for any discharge medications will be authorized once you are discharged, as it is imperative that you return to your primary care physician (or establish a relationship with a primary care physician if you do not have one) for your post hospital discharge needs so that they can reassess your need for medications and monitor your lab values.    Time coordinating discharge: 40 minutes  SIGNED:   Shelly Coss, MD  [facility] 04/16/2021, 1:32 PM Pager [phone]  If 7PM-7AM, please contact night-coverage [email or web address] Password [email or web address]

## 2021-04-16 NOTE — Progress Notes (Signed)
Physical Therapy Treatment Patient Details Name: Thomas Duarte MRN: [id number] DOB: 11/14/1940 Today's Date: 04/16/2021    History of Present Illness Pt is an 80 y.o. male admitted 04/10/21 with c/o worsening BLE weakness, urinary incontinence; of note, pt recently started prostate CA treatment (completed 4 wks of radiation 2 wks prior). Workup for AKI on CKD 3. Lumbar MRI showed degenerative disease. 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    PT Comments    Pt sitting between bedrail and foot of bed, trying to reach over to tray table to eat breakfast. Pt agreeable to getting up to recliner to finish breakfast. Pt is making good progress towards his goals, however continues to be limited in safe mobility by decreased safety awarenedd, in presence of decreased strength and endurance.  Pt currently min guard for transfers and short distance ambulation with Rollator. D/c plans remain appropriate at this time. PT will continue to follow acutely.   Follow Up Recommendations  SNF;Supervision for mobility/OOB     Equipment Recommendations  None recommended by PT       Precautions / Restrictions Precautions Precautions: Fall Restrictions Weight Bearing Restrictions: No    Mobility  Bed Mobility               General bed mobility comments: sitting EoB on entry, between bedrail and foot of bed, pt reporting being very cold and trying to reach his breakfast because it was not placed over him    Transfers Overall transfer level: Needs assistance Equipment used: 4-wheeled walker Transfers: Sit to/from Stand Sit to Stand: Min guard;From elevated surface         General transfer comment: min guard and increased time and effort to power up to Rollator, good self steadying  Ambulation/Gait Ambulation/Gait assistance: Min guard Gait Distance (Feet): 35 Feet Assistive device: 4-wheeled walker Gait Pattern/deviations:  Step-through pattern;Decreased step length - right;Decreased step length - left;Decreased weight shift to left;Antalgic;Trunk flexed Gait velocity: Decreased Gait velocity interpretation: <1.31 ft/sec, indicative of household ambulator General Gait Details: min guard for safety, decreased weightshift to L due to L thigh pain, vc for upright posture and proximity to RW         Balance Overall balance assessment: Needs assistance Sitting-balance support: No upper extremity supported;Feet supported Sitting balance-Leahy Scale: Good     Standing balance support: No upper extremity supported Standing balance-Leahy Scale: Fair Standing balance comment: can stand without assist however balance improved with UE support                            Cognition Arousal/Alertness: Awake/alert Behavior During Therapy: WFL for tasks assessed/performed Overall Cognitive Status: No family/caregiver present to determine baseline cognitive functioning Area of Impairment: Attention;Safety/judgement;Problem solving;Awareness                   Current Attention Level: Selective   Following Commands: Follows one step commands consistently;Follows one step commands with increased time;Follows multi-step commands inconsistently Safety/Judgement: Decreased awareness of deficits;Decreased awareness of safety Awareness: Emergent Problem Solving: Slow processing;Requires verbal cues General Comments: requires increased time but able to follow some multistep commands nees cuing for safety with ambulation         General Comments General comments (skin integrity, edema, etc.): VSS on RA      Pertinent Vitals/Pain Pain Assessment: Faces Faces Pain Scale: Hurts little more Pain Location: LLE (thigh) Pain Descriptors /  Indicators: Discomfort;Sore Pain Intervention(s): Limited activity within patient's tolerance;Monitored during session;Repositioned     PT Goals (current goals can now  be found in the care plan section) Acute Rehab PT Goals PT Goal Formulation: With patient Time For Goal Achievement: 04/26/21 Potential to Achieve Goals: Good Progress towards PT goals: Progressing toward goals    Frequency    Min 3X/week      PT Plan Current plan remains appropriate       AM-PAC PT "6 Clicks" Mobility   Outcome Measure  Help needed turning from your back to your side while in a flat bed without using bedrails?: A Little Help needed moving from lying on your back to sitting on the side of a flat bed without using bedrails?: A Little Help needed moving to and from a bed to a chair (including a wheelchair)?: A Little Help needed standing up from a chair using your arms (e.g., wheelchair or bedside chair)?: A Little Help needed to walk in hospital room?: A Little Help needed climbing 3-5 steps with a railing? : A Lot 6 Click Score: 17    End of Session Equipment Utilized During Treatment: Gait belt Activity Tolerance: Patient tolerated treatment well;Patient limited by fatigue Patient left: in chair;with call bell/phone within reach;with chair alarm set Nurse Communication: Mobility status PT Visit Diagnosis: Other abnormalities of gait and mobility (R26.89);Muscle weakness (generalized) (M62.81)     Time: 1751-0258 PT Time Calculation (min) (ACUTE ONLY): 17 min  Charges:  $Gait Training: 8-22 mins                     Haylea Schlichting B. Migdalia Dk PT, DPT Acute Rehabilitation Services Pager [phone] Office [phone]    [place] 04/16/2021, 8:27 AM

## 2021-04-17 LAB — GLUCOSE, CAPILLARY
Glucose-Capillary: 128 mg/dL — ABNORMAL HIGH (ref 70–99)
Glucose-Capillary: 157 mg/dL — ABNORMAL HIGH (ref 70–99)
Glucose-Capillary: 158 mg/dL — ABNORMAL HIGH (ref 70–99)
Glucose-Capillary: 128 mg/dL — ABNORMAL HIGH (ref 70–99)

## 2021-04-17 NOTE — Progress Notes (Signed)
Physical Therapy Treatment Patient Details Name: Thomas Duarte MRN: [id number] DOB: 1941-03-11 Today's Date: 04/17/2021    History of Present Illness Pt is an 80 y.o. male admitted 04/10/21 with c/o worsening BLE weakness, urinary incontinence; of note, pt recently started prostate CA treatment (completed 4 wks of radiation 2 wks prior). Workup for AKI on CKD 3. Lumbar MRI showed degenerative disease. 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   PT Comments    Pt progressing with mobility. Today's session focused on transfer and gait training with RW. Pt demonstrates improving activity tolerance, although requires frequent rest breaks secondary to fatigue. Pt remains limited by generalized weakness, decreased activity tolerance, and impaired balance strategies/postural reactions, as well as decreased awareness and slowed processing. Continue to recommend SNF-level therapies to maximize functional mobility and independence prior to return home.    Follow Up Recommendations  SNF;Supervision for mobility/OOB     Equipment Recommendations  None recommended by PT    Recommendations for Other Services       Precautions / Restrictions Precautions Precautions: Fall Restrictions Weight Bearing Restrictions: No    Mobility  Bed Mobility Overal bed mobility: Needs Assistance Bed Mobility: Sit to Supine       Sit to supine: Min assist   General bed mobility comments: MinA for LE management return to supine    Transfers Overall transfer level: Needs assistance Equipment used: 4-wheeled walker;1 person hand held assist Transfers: Sit to/from Stand Sit to Stand: Min assist;Mod assist         General transfer comment: Initial modA fro trunk elevation standing from recliner to rollator, significant increased time and effort to achieve fully upright posture; additional standing trial from recliner with minA for trunk elevation  and HHA for stability, heavy reliance on UE support to push into standing  Ambulation/Gait Ambulation/Gait assistance: Min guard;Min assist Gait Distance (Feet): 50 Feet (+ 60' + 58' + 80') Assistive device: 4-wheeled walker Gait Pattern/deviations: Step-through pattern;Decreased stride length;Trunk flexed;Antalgic Gait velocity: Decreased Gait velocity interpretation: <1.31 ft/sec, indicative of household ambulator General Gait Details: Slow, labored gait with rollator and min guard for balance; frequent standing rest break with c/o fatigue and LLE tightness; pt with forward flexion requiring frequent cues to maintain closer proximity to RW and maintain upright posture   Stairs             Wheelchair Mobility    Modified Rankin (Stroke Patients Only)       Balance Overall balance assessment: Needs assistance Sitting-balance support: No upper extremity supported;Feet supported Sitting balance-Leahy Scale: Good     Standing balance support: Bilateral upper extremity supported;Single extremity supported;During functional activity Standing balance-Leahy Scale: Fair Standing balance comment: Reliant on UE support                            Cognition Arousal/Alertness: Awake/alert Behavior During Therapy: WFL for tasks assessed/performed;Flat affect Overall Cognitive Status: No family/caregiver present to determine baseline cognitive functioning Area of Impairment: Following commands;Safety/judgement;Awareness;Problem solving                   Current Attention Level: Selective   Following Commands: Follows one step commands consistently;Follows one step commands with increased time;Follows multi-step commands inconsistently Safety/Judgement: Decreased awareness of safety;Decreased awareness of deficits Awareness: Emergent Problem Solving: Slow processing;Difficulty sequencing;Requires verbal cues General Comments: requires increased time for  processing, decreased awareness to deficits and  safety      Exercises      General Comments        Pertinent Vitals/Pain Pain Assessment: Faces Faces Pain Scale: Hurts little more Pain Location: LLE (thigh/knee) Pain Descriptors / Indicators: Tightness Pain Intervention(s): Monitored during session    Home Living                      Prior Function            PT Goals (current goals can now be found in the care plan section) Progress towards PT goals: Progressing toward goals    Frequency    Min 3X/week      PT Plan Current plan remains appropriate    Co-evaluation              AM-PAC PT "6 Clicks" Mobility   Outcome Measure  Help needed turning from your back to your side while in a flat bed without using bedrails?: A Little Help needed moving from lying on your back to sitting on the side of a flat bed without using bedrails?: A Little Help needed moving to and from a bed to a chair (including a wheelchair)?: A Little Help needed standing up from a chair using your arms (e.g., wheelchair or bedside chair)?: A Lot Help needed to walk in hospital room?: A Little Help needed climbing 3-5 steps with a railing? : A Lot 6 Click Score: 16    End of Session Equipment Utilized During Treatment: Gait belt Activity Tolerance: Patient tolerated treatment well Patient left: in bed;with call bell/phone within reach;with bed alarm set Nurse Communication: Mobility status PT Visit Diagnosis: Other abnormalities of gait and mobility (R26.89);Muscle weakness (generalized) (M62.81)     Time: 1025-1100 PT Time Calculation (min) (ACUTE ONLY): 35 min  Charges:  $Gait Training: 8-22 mins $Therapeutic Activity: 8-22 mins                     Mabeline Caras, PT, DPT Acute Rehabilitation Services  Pager [phone] Office [place] 04/17/2021, 4:05 PM

## 2021-04-17 NOTE — Plan of Care (Signed)
  Problem: Clinical Measurements: Goal: Ability to maintain clinical measurements within normal limits will improve Outcome: Progressing Goal: Will remain free from infection Outcome: Progressing Goal: Diagnostic test results will improve Outcome: Progressing Goal: Respiratory complications will improve Outcome: Progressing Goal: Cardiovascular complication will be avoided Outcome: Progressing   Problem: Activity: Goal: Risk for activity intolerance will decrease Outcome: Progressing   Problem: Elimination: Goal: Will not experience complications related to bowel motility Outcome: Progressing

## 2021-04-17 NOTE — Progress Notes (Signed)
Occupational Therapy Treatment Patient Details Name: Thomas Duarte MRN: [id number] DOB: 10-07-1940 Today's Date: 04/17/2021    History of present illness Pt is an 80 y.o. male admitted 04/10/21 with c/o worsening BLE weakness, urinary incontinence; of note, pt recently started prostate CA treatment (completed 4 wks of radiation 2 wks prior). Workup for AKI on CKD 3. Lumbar MRI showed degenerative disease. 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   OT comments  Patient supine in bed, soiled in urine.  Supervision for bed mobility, min-mod assist for sit to stand (increased assist from regular height surface--ie recliner), and mod assist for toileting/ LB dressing.  Pt reports spouse works at tutoring center and cares for grandchild.Continue to recommend SNF at this time.  Will follow acutely.    Follow Up Recommendations  SNF;Supervision/Assistance - 24 hour    Equipment Recommendations  Other (comment) (TBD at next venue of care)    Recommendations for Other Services PT consult    Precautions / Restrictions Precautions Precautions: Fall Restrictions Weight Bearing Restrictions: No       Mobility Bed Mobility Overal bed mobility: Needs Assistance Bed Mobility: Sit to Supine     Supine to sit: Supervision;HOB elevated     General bed mobility comments: for safety    Transfers Overall transfer level: Needs assistance Equipment used: Rolling walker (2 wheeled) Transfers: Sit to/from Stand Sit to Stand: Min assist;Mod assist         General transfer comment: min assist from elevated EOB and mod assist from recliner to power up and steady--cueing for hand placement    Balance Overall balance assessment: Needs assistance Sitting-balance support: No upper extremity supported;Feet supported Sitting balance-Leahy Scale: Good Sitting balance - Comments: limited dynaimcally during LB ADLs requiring min guard to  supervision   Standing balance support: Bilateral upper extremity supported;Single extremity supported;During functional activity Standing balance-Leahy Scale: Fair Standing balance comment: min guard with BUE and up to min assist during ADLs with 1 UE support                           ADL either performed or assessed with clinical judgement   ADL Overall ADL's : Needs assistance/impaired     Grooming: Set up;Sitting           Upper Body Dressing : Set up;Sitting   Lower Body Dressing: Moderate assistance;Sit to/from stand Lower Body Dressing Details (indicate cue type and reason): requires assist for L sock, min-mod assist sit to stand Toilet Transfer: Minimal assistance;Moderate assistance;Ambulation;RW Toilet Transfer Details (indicate cue type and reason): simulated to recliner Toileting- Clothing Manipulation and Hygiene: Moderate assistance;Sit to/from stand Toileting - Clothing Manipulation Details (indicate cue type and reason): incontient of bladder in bed, mod assist for hygiene in standing     Functional mobility during ADLs: Minimal assistance;Rolling walker General ADL Comments: pt limited by cognition, activity tolerance, weakness, and impaired balance     Vision       Perception     Praxis      Cognition Arousal/Alertness: Awake/alert Behavior During Therapy: WFL for tasks assessed/performed Overall Cognitive Status: No family/caregiver present to determine baseline cognitive functioning Area of Impairment: Awareness;Problem solving;Safety/judgement                         Safety/Judgement: Decreased awareness of safety;Decreased awareness of deficits Awareness: Emergent Problem Solving: Slow processing;Difficulty sequencing;Requires  verbal cues General Comments: requires increased time, decreased awareness to deficits, safety. No concern of soiled bed.        Exercises     Shoulder Instructions       General Comments       Pertinent Vitals/ Pain       Pain Assessment: No/denies pain  Home Living                                          Prior Functioning/Environment              Frequency  Min 2X/week        Progress Toward Goals  OT Goals(current goals can now be found in the care plan section)  Progress towards OT goals: Progressing toward goals  Acute Rehab OT Goals Patient Stated Goal: to improve strength and return to independence OT Goal Formulation: With patient  Plan Discharge plan remains appropriate;Frequency remains appropriate    Co-evaluation                 AM-PAC OT "6 Clicks" Daily Activity     Outcome Measure   Help from another person eating meals?: A Little Help from another person taking care of personal grooming?: A Little Help from another person toileting, which includes using toliet, bedpan, or urinal?: A Lot Help from another person bathing (including washing, rinsing, drying)?: A Lot Help from another person to put on and taking off regular upper body clothing?: A Little Help from another person to put on and taking off regular lower body clothing?: A Lot 6 Click Score: 15    End of Session Equipment Utilized During Treatment: Rolling walker  OT Visit Diagnosis: Other abnormalities of gait and mobility (R26.89);Muscle weakness (generalized) (M62.81)   Activity Tolerance Patient tolerated treatment well   Patient Left in chair;with call bell/phone within reach;with chair alarm set   Nurse Communication Mobility status        Time: 4235-3614 OT Time Calculation (min): 28 min  Charges: OT General Charges $OT Visit: 1 Visit OT Treatments $Self Care/Home Management : 23-37 mins  [place] Pager [phone] Office [phone]    Delight Stare 04/17/2021, 9:22 AM

## 2021-04-17 NOTE — Progress Notes (Signed)
Patient seen and examined at the bedside this morning.  Hemodynamically stable.  There is no change in the medical management.  Patient has already been discharged yesterday.  He is waiting for bed in SN facility.  Insurance authorization is pending.  I had called his wife and updated about the plan yesterday.  Discharge summary and orders are already there.

## 2021-04-18 DIAGNOSIS — I10 Essential (primary) hypertension: Secondary | ICD-10-CM

## 2021-04-18 LAB — GLUCOSE, CAPILLARY
Glucose-Capillary: 116 mg/dL — ABNORMAL HIGH (ref 70–99)
Glucose-Capillary: 165 mg/dL — ABNORMAL HIGH (ref 70–99)
Glucose-Capillary: 143 mg/dL — ABNORMAL HIGH (ref 70–99)
Glucose-Capillary: 135 mg/dL — ABNORMAL HIGH (ref 70–99)

## 2021-04-18 NOTE — Progress Notes (Signed)
PROGRESS NOTE  Thomas Duarte SHF:[id number] DOB: [birth year] DOA: 04/10/2021 PCP: Monico Blitz, MD  HPI/Recap of past 24 hours: Patient examined at bedside Denies any complaints.  He has been discharged but waiting for insurance approval   Assessment/Plan: Active Problems:   AKI (acute kidney injury) ([place])  removal of scar tissue.   GOLD SEED IMPLANT N/A 12/16/2020    Procedure: GOLD SEED IMPLANT;  Surgeon: Cleon Gustin, MD;  Location: [facility];  Service: Urology;  Laterality: N/A;  Dr. requests time 1:00  Acute on chronic kidney disease stage IIIb Continue Lasix per home  2.  Generalized weakness bilateral lower extremity weakness ambulatory dysfunction.  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  Elevated troponin likely secondary to ischemia demand ischemia and AKI  5.  Congestive heart failure: Continue Lasix  6.  For prostate cancer.  Recently completed radiation therapy he has implanted seed gold seed implant. Continue follow-up with urology  Code Status: Full  Severity of Illness: The appropriate patient status for this patient is INPATIENT. Inpatient status is judged to be reasonable and necessary in order to provide the required intensity of service to ensure the patient's safety. The patient's presenting symptoms, physical exam findings, and initial radiographic and laboratory data in the context of their chronic comorbidities is felt to place them at high risk for further clinical deterioration. Furthermore, it is not anticipated that the patient will be medically stable for discharge from the hospital within 2 midnights of admission. The following factors support the patient status of inpatient.   " Waiting for  placement and insurance approval Unsafe for discharge to home * I certify that at the point of admission it is my clinical judgment that the patient will require inpatient hospital care spanning beyond 2 midnights from the point of admission due to high intensity of service, high risk for further deterioration and high frequency of surveillance required.*   Family Communication: None at bedside  Disposition Plan: Waiting for SNF Status is: Inpatient   Dispo: The patient is from: Home              Anticipated d/c is to:               Anticipated d/c date is:               Patient currently not medically stable for discharge  Consultants: None  Procedures: None  Antimicrobials: None  DVT prophylaxis: Subacute heparin   Objective: Vitals:   04/17/21 1618 04/17/21 1957 04/18/21 0510 04/18/21 0816  BP: (!) 144/56 139/67 (!) 153/45 (!) 157/45  Pulse: 68 68 76 (!) 34  Resp: 18 17 19    Temp: 98.2 F (36.8 C) 98.8 F (37.1 C) 98.1 F (36.7 C) 98.4 F (36.9 C)  TempSrc: Oral Oral Oral Oral  SpO2: 98% 98% 99% 98%  Weight:   96.4 kg   Height:        Intake/Output Summary (Last 24 hours) at 04/18/2021 0953 Last data filed at 04/18/2021 0900 Gross per 24 hour  Intake 720 ml  Output 1325 ml  Net -605 ml   Filed Weights   04/16/21 0451 04/17/21 0446 04/18/21 0510  Weight: 95.8 kg 95.5 kg 96.4 kg   Body mass index is 28.05  kg/m.  Exam:  General: 80 y.o. year-old male well developed well nourished in no acute distress.  Alert and oriented x3. Cardiovascular: Regular rate and rhythm with no rubs or gallops.  No thyromegaly or JVD noted.   Respiratory: Clear to auscultation with no wheezes or rales. Good inspiratory effort. Abdomen: Soft nontender nondistended with normal bowel sounds x4 quadrants. Musculoskeletal: No lower extremity edema. 2/4 pulses in all 4 extremities. Skin: No ulcerative lesions noted or rashes, Psychiatry: Mood is appropriate for condition and  setting Neurology:    Data Reviewed: CBC: No results for input(s): WBC, NEUTROABS, HGB, HCT, MCV, PLT in the last 168 hours. Basic Metabolic Panel: Recent Labs  Lab 04/12/21 0413 04/13/21 0405 04/15/21 0237  NA 139 138 136  K 4.0 4.1 4.0  CL 116* 115* 111  CO2 16* 16* 16*  GLUCOSE 125* 134* 111*  BUN 69* 59* 53*  CREATININE 2.59* 2.45* 2.27*  CALCIUM 8.7* 9.0 8.8*   GFR: Estimated Creatinine Clearance: 31.2 mL/min (A) (by C-G formula based on SCr of 2.27 mg/dL (H)). Liver Function Tests: No results for input(s): AST, ALT, ALKPHOS, BILITOT, PROT, ALBUMIN in the last 168 hours. No results for input(s): LIPASE, AMYLASE in the last 168 hours. No results for input(s): AMMONIA in the last 168 hours. Coagulation Profile: No results for input(s): INR, PROTIME in the last 168 hours. Cardiac Enzymes: No results for input(s): CKTOTAL, CKMB, CKMBINDEX, TROPONINI in the last 168 hours. BNP (last 3 results) No results for input(s): PROBNP in the last 8760 hours. HbA1C: No results for input(s): HGBA1C in the last 72 hours. CBG: Recent Labs  Lab 04/17/21 0611 04/17/21 1127 04/17/21 1546 04/17/21 2206 04/18/21 0606  GLUCAP 128* 157* 158* 128* 116*   Lipid Profile: No results for input(s): CHOL, HDL, LDLCALC, TRIG, CHOLHDL, LDLDIRECT in the last 72 hours. Thyroid Function Tests: No results for input(s): TSH, T4TOTAL, FREET4, T3FREE, THYROIDAB in the last 72 hours. Anemia Panel: No results for input(s): VITAMINB12, FOLATE, FERRITIN, TIBC, IRON, RETICCTPCT in the last 72 hours. Urine analysis:    Component Value Date/Time   COLORURINE YELLOW 04/11/2021 0814   APPEARANCEUR HAZY (A) 04/11/2021 0814   APPEARANCEUR Clear 01/01/2021 1448   LABSPEC 1.010 04/11/2021 0814   PHURINE 6.0 04/11/2021 [place] 04/11/2021 0814   HGBUR NEGATIVE 04/11/2021 0814   BILIRUBINUR NEGATIVE 04/11/2021 0814   BILIRUBINUR Negative 01/01/2021 [place] 04/11/2021  0814   PROTEINUR NEGATIVE 04/11/2021 0814   NITRITE NEGATIVE 04/11/2021 0814   LEUKOCYTESUR NEGATIVE 04/11/2021 0814   Sepsis Labs: @LABRCNTIP (procalcitonin:4,lacticidven:4)  ) Recent Results (from the past 240 hour(s))  Resp Panel by RT-PCR (Flu A&B, Covid) Nasopharyngeal Swab     Status: None   Collection Time: 04/10/21  4:26 PM   Specimen: Nasopharyngeal Swab; Nasopharyngeal(NP) swabs in vial transport medium  Result Value Ref Range Status   SARS Coronavirus 2 by RT PCR NEGATIVE NEGATIVE Final    Comment: (NOTE) SARS-CoV-2 target nucleic acids are NOT DETECTED.  The SARS-CoV-2 RNA is generally detectable in upper respiratory specimens during the acute phase of infection. The lowest concentration of SARS-CoV-2 viral copies this assay can detect is 138 copies/mL. A negative result does not preclude SARS-Cov-2 infection and should not be used as the sole basis for treatment or other patient management decisions. A negative result may occur with  improper specimen collection/handling, submission of specimen other than nasopharyngeal swab, presence of viral mutation(s) within the areas targeted by this assay, and  inadequate number of viral copies(<138 copies/mL). A negative result must be combined with clinical observations, patient history, and epidemiological information. The expected result is Negative.  Fact Sheet for Patients:  EntrepreneurPulse.com.au  Fact Sheet for Healthcare Providers:  IncredibleEmployment.be  This test is no t yet approved or cleared by the [place] FDA and  has been authorized for detection and/or diagnosis of SARS-CoV-2 by FDA under an Emergency Use Authorization (EUA). This EUA will remain  in effect (meaning this test can be used) for the duration of the COVID-19 declaration under Section 564(b)(1) of the Act, 21 U.S.C.section 360bbb-3(b)(1), unless the authorization is terminated  or revoked sooner.        Influenza A by PCR NEGATIVE NEGATIVE Final   Influenza B by PCR NEGATIVE NEGATIVE Final    Comment: (NOTE) The Xpert Xpress SARS-CoV-2/FLU/RSV plus assay is intended as an aid in the diagnosis of influenza from Nasopharyngeal swab specimens and should not be used as a sole basis for treatment. Nasal washings and aspirates are unacceptable for Xpert Xpress SARS-CoV-2/FLU/RSV testing.  Fact Sheet for Patients: EntrepreneurPulse.com.au  Fact Sheet for Healthcare Providers: IncredibleEmployment.be  This test is not yet approved or cleared by the [place] FDA and has been authorized for detection and/or diagnosis of SARS-CoV-2 by FDA under an Emergency Use Authorization (EUA). This EUA will remain in effect (meaning this test can be used) for the duration of the COVID-19 declaration under Section 564(b)(1) of the Act, 21 U.S.C. section 360bbb-3(b)(1), unless the authorization is terminated or revoked.  Performed at [facility], [place] [address], [place], [place] [address]       Studies: No results found.  Scheduled Meds:  amLODipine  10 mg Oral Daily   aspirin  325 mg Oral Daily   clopidogrel  75 mg Oral QHS   furosemide  40 mg Oral Daily   heparin  5,000 Units Subcutaneous Q12H   insulin aspart  0-9 Units Subcutaneous TID WC   metoprolol succinate  25 mg Oral Daily   sodium bicarbonate  650 mg Oral TID   tamsulosin  0.8 mg Oral QHS    Continuous Infusions:   LOS: 8 days     Cristal Deer, MD [facility]  To reach me or the doctor on call, go to: [email or web address] Password [email or web address]  04/18/2021, 9:53 AM

## 2021-04-18 NOTE — Plan of Care (Signed)
  Problem: Clinical Measurements: Goal: Ability to maintain clinical measurements within normal limits will improve Outcome: Progressing Goal: Will remain free from infection Outcome: Progressing Goal: Diagnostic test results will improve Outcome: Progressing Goal: Respiratory complications will improve Outcome: Progressing Goal: Cardiovascular complication will be avoided Outcome: Progressing   Problem: Activity: Goal: Risk for activity intolerance will decrease Outcome: Progressing   Problem: Elimination: Goal: Will not experience complications related to bowel motility Outcome: Progressing

## 2021-04-18 NOTE — TOC Progression Note (Addendum)
Transition of Care [facility]) - Progression Note    Patient Details  Name: Thomas Duarte MRN: [id number] Date of Birth: 03/02/1941  Transition of Care [facility]) CM/SW [place], [place] Phone Number: 04/18/2021, 10:15 AM  Clinical Narrative:    CSW contacted Aris Everts with no answer, CSW left VM for a follow up for availability.  [place] shared with CSW that they are waiting on auth and will follow up with CSW once Josem Kaufmann is back.  Expected Discharge Plan: [place] Barriers to Discharge: Continued Medical Work up  Expected Discharge Plan and Services Expected Discharge Plan: [place] In-house Referral: Clinical Social Work   Post Acute Care Choice: [place] Living arrangements for the past 2 months: Single Family Home Expected Discharge Date: 04/16/21                                     Social Determinants of Health (SDOH) Interventions    Readmission Risk Interventions No flowsheet data found.

## 2021-04-18 NOTE — Care Management Important Message (Signed)
Important Message  Patient Details  Name: DURWARD MATRANGA MRN: [id number] Date of Birth: 1941-08-16   Medicare Important Message Given:  Yes     Shelda Altes 04/18/2021, 1:03 PM

## 2021-04-19 DIAGNOSIS — I10 Essential (primary) hypertension: Secondary | ICD-10-CM | POA: Diagnosis present

## 2021-04-19 DIAGNOSIS — E119 Type 2 diabetes mellitus without complications: Secondary | ICD-10-CM

## 2021-04-19 LAB — BASIC METABOLIC PANEL
Anion gap: 6 (ref 5–15)
BUN: 37 mg/dL — ABNORMAL HIGH (ref 8–23)
CO2: 19 mmol/L — ABNORMAL LOW (ref 22–32)
Calcium: 8.7 mg/dL — ABNORMAL LOW (ref 8.9–10.3)
Chloride: 109 mmol/L (ref 98–111)
Creatinine, Ser: 2.26 mg/dL — ABNORMAL HIGH (ref 0.61–1.24)
GFR, Estimated: 28 mL/min — ABNORMAL LOW (ref >60)
Glucose, Bld: 208 mg/dL — ABNORMAL HIGH (ref 70–99)
Potassium: 3.9 mmol/L (ref 3.5–5.1)
Sodium: 134 mmol/L — ABNORMAL LOW (ref 135–145)

## 2021-04-19 LAB — GLUCOSE, CAPILLARY
Glucose-Capillary: 108 mg/dL — ABNORMAL HIGH (ref 70–99)
Glucose-Capillary: 177 mg/dL — ABNORMAL HIGH (ref 70–99)
Glucose-Capillary: 113 mg/dL — ABNORMAL HIGH (ref 70–99)
Glucose-Capillary: 207 mg/dL — ABNORMAL HIGH (ref 70–99)

## 2021-04-19 LAB — MAGNESIUM: Magnesium: 2 mg/dL (ref 1.7–2.4)

## 2021-04-19 NOTE — TOC Progression Note (Signed)
Transition of Care [facility]) - Progression Note    Patient Details  Name: Thomas Duarte MRN: [id number] Date of Birth: March 22, 1941  Transition of Care [facility]) CM/SW [place], LCSW Phone Number:336 [phone] 04/19/2021, 9:01 AM  Clinical Narrative:     CSW followed up with Ebony Hail at the [facility] and she explained that authorization was denied. Reference # [id number]. Peer to peer # [phone] option #3, must be completed by noon on 9/6.  MD alerted.  TOC team will continue to assist with discharge planning needs.   Expected Discharge Plan: [place] Barriers to Discharge: Continued Medical Work up  Expected Discharge Plan and Services Expected Discharge Plan: [place] In-house Referral: Clinical Social Work   Post Acute Care Choice: [place] Living arrangements for the past 2 months: Single Family Home Expected Discharge Date: 04/16/21                                     Social Determinants of Health (SDOH) Interventions    Readmission Risk Interventions No flowsheet data found.

## 2021-04-19 NOTE — Progress Notes (Signed)
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  He was admitted for bilateral lower extremity weakness with severe debility and inability to walk.  PT OT examined and recommended nursing home placement.  Patient had been discharged but just waiting for insurance approval to go to SNF. Lumbar MRI showed degenerative changes  Subjective: April 19, 2021: Patient examined at bedside he is sitting in the chair recliner Denies any complaints.  He has been discharged but waiting for insurance approval   Assessment/Plan: Principal Problem:   AKI (acute kidney injury) ([place]) Active Problems:   Prostate cancer ([place])   Hypertension   General weakness   Diabetes mellitus without complication ([place])  removal of scar tissue.   GOLD SEED IMPLANT N/A 12/16/2020    Procedure: GOLD SEED IMPLANT;  Surgeon: Cleon Gustin, MD;  Location: [facility];  Service: Urology;  Laterality: N/A;  Dr. requests time 1:00  Acute on chronic kidney disease stage IIIb Continue Lasix per home  2.  Generalized weakness bilateral lower extremity weakness ambulatory dysfunction.  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  Elevated troponin likely secondary to ischemia demand ischemia and AKI  5.  Congestive heart failure: Continue Lasix  6.  For prostate cancer.  Recently  completed radiation therapy he has implanted seed gold seed implant. Continue follow-up with urology  Code Status: Full  Severity of Illness: The appropriate patient status for this patient is INPATIENT. Inpatient status is judged to be reasonable and necessary in order to provide the required intensity of service to ensure the patient's safety. The patient's presenting symptoms, physical exam findings, and initial radiographic and laboratory data in the context of their chronic comorbidities is felt to place them at high risk for further clinical deterioration. Furthermore, it is not anticipated that the patient will be medically stable for discharge from the hospital within 2 midnights of admission. The following factors support the patient status of inpatient.   " Waiting for placement and insurance approval Unsafe for discharge to home * I certify that at the point of admission it is my clinical judgment that the patient will require inpatient hospital care spanning beyond 2 midnights from the point of admission due to high intensity of service, high risk for further deterioration and high frequency of surveillance required.*   Family Communication: None at bedside  Disposition Plan: Waiting for SNF Status is: Inpatient   Dispo: The patient is from: Home              Anticipated d/c is to:               Anticipated d/c date is:               Patient currently not medically stable for discharge  Consultants: None  Procedures: None  Antimicrobials: None  DVT prophylaxis: Subacute heparin   Objective: Vitals:   04/18/21 1911 04/19/21 0435 04/19/21 1047 04/19/21 1640  BP: (!) 149/72 [place] Kitchen)  170/44 (!) 149/73 (!) 144/59  Pulse: 69 (!) 42 (!) 41 83  Resp: 19 18 18 20   Temp: 97.6 F (36.4 C) 98.4 F (36.9 C) 97.7 F (36.5 C) 97.7 F (36.5 C)  TempSrc: Oral Oral Oral Oral  SpO2: 100% 99% 100% 98%  Weight:  95.4 kg    Height:        Intake/Output Summary (Last 24 hours) at  04/19/2021 1814 Last data filed at 04/19/2021 1750 Gross per 24 hour  Intake 960 ml  Output 1125 ml  Net -165 ml    Filed Weights   04/17/21 0446 04/18/21 0510 04/19/21 0435  Weight: 95.5 kg 96.4 kg 95.4 kg   Body mass index is 27.75 kg/m.  Exam:  General: 80 y.o. year-old male well developed well nourished in no acute distress.  Alert and oriented x3. Cardiovascular: Regular rate and rhythm with no rubs or gallops.  No thyromegaly or JVD noted.   Respiratory: Clear to auscultation with no wheezes or rales. Good inspiratory effort. Abdomen: Soft nontender nondistended with normal bowel sounds x4 quadrants. Musculoskeletal: No lower extremity edema. 2/4 pulses in all 4 extremities. Skin: No ulcerative lesions noted or rashes, Psychiatry: Mood is appropriate for condition and setting Neurology:    Data Reviewed: CBC: No results for input(s): WBC, NEUTROABS, HGB, HCT, MCV, PLT in the last 168 hours. Basic Metabolic Panel: Recent Labs  Lab 04/13/21 0405 04/15/21 0237  NA 138 136  K 4.1 4.0  CL 115* 111  CO2 16* 16*  GLUCOSE 134* 111*  BUN 59* 53*  CREATININE 2.45* 2.27*  CALCIUM 9.0 8.8*    GFR: Estimated Creatinine Clearance: 28.8 mL/min (A) (by C-G formula based on SCr of 2.27 mg/dL (H)). Liver Function Tests: No results for input(s): AST, ALT, ALKPHOS, BILITOT, PROT, ALBUMIN in the last 168 hours. No results for input(s): LIPASE, AMYLASE in the last 168 hours. No results for input(s): AMMONIA in the last 168 hours. Coagulation Profile: No results for input(s): INR, PROTIME in the last 168 hours. Cardiac Enzymes: No results for input(s): CKTOTAL, CKMB, CKMBINDEX, TROPONINI in the last 168 hours. BNP (last 3 results) No results for input(s): PROBNP in the last 8760 hours. HbA1C: No results for input(s): HGBA1C in the last 72 hours. CBG: Recent Labs  Lab 04/18/21 1513 04/18/21 2108 04/19/21 0600 04/19/21 1110 04/19/21 1621  GLUCAP 143* 135* 108* 177* 113*     Lipid Profile: No results for input(s): CHOL, HDL, LDLCALC, TRIG, CHOLHDL, LDLDIRECT in the last 72 hours. Thyroid Function Tests: No results for input(s): TSH, T4TOTAL, FREET4, T3FREE, THYROIDAB in the last 72 hours. Anemia Panel: No results for input(s): VITAMINB12, FOLATE, FERRITIN, TIBC, IRON, RETICCTPCT in the last 72 hours. Urine analysis:    Component Value Date/Time   COLORURINE YELLOW 04/11/2021 0814   APPEARANCEUR HAZY (A) 04/11/2021 0814   APPEARANCEUR Clear 01/01/2021 1448   LABSPEC 1.010 04/11/2021 0814   PHURINE 6.0 04/11/2021 [place] 04/11/2021 0814   HGBUR NEGATIVE 04/11/2021 0814   BILIRUBINUR NEGATIVE 04/11/2021 0814   BILIRUBINUR Negative 01/01/2021 [place] 04/11/2021 0814   PROTEINUR NEGATIVE 04/11/2021 0814   NITRITE NEGATIVE 04/11/2021 0814   LEUKOCYTESUR NEGATIVE 04/11/2021 0814   Sepsis Labs: @LABRCNTIP (procalcitonin:4,lacticidven:4)  ) Recent Results (from the past 240 hour(s))  Resp Panel by RT-PCR (Flu A&B, Covid) Nasopharyngeal Swab     Status: None   Collection Time: 04/10/21  4:26 PM   Specimen: Nasopharyngeal Swab; Nasopharyngeal(NP) swabs in vial  transport medium  Result Value Ref Range Status   SARS Coronavirus 2 by RT PCR NEGATIVE NEGATIVE Final    Comment: (NOTE) SARS-CoV-2 target nucleic acids are NOT DETECTED.  The SARS-CoV-2 RNA is generally detectable in upper respiratory specimens during the acute phase of infection. The lowest concentration of SARS-CoV-2 viral copies this assay can detect is 138 copies/mL. A negative result does not preclude SARS-Cov-2 infection and should not be used as the sole basis for treatment or other patient management decisions. A negative result may occur with  improper specimen collection/handling, submission of specimen other than nasopharyngeal swab, presence of viral mutation(s) within the areas targeted by this assay, and inadequate number of viral copies(<138  copies/mL). A negative result must be combined with clinical observations, patient history, and epidemiological information. The expected result is Negative.  Fact Sheet for Patients:  EntrepreneurPulse.com.au  Fact Sheet for Healthcare Providers:  IncredibleEmployment.be  This test is no t yet approved or cleared by the [place] FDA and  has been authorized for detection and/or diagnosis of SARS-CoV-2 by FDA under an Emergency Use Authorization (EUA). This EUA will remain  in effect (meaning this test can be used) for the duration of the COVID-19 declaration under Section 564(b)(1) of the Act, 21 U.S.C.section 360bbb-3(b)(1), unless the authorization is terminated  or revoked sooner.       Influenza A by PCR NEGATIVE NEGATIVE Final   Influenza B by PCR NEGATIVE NEGATIVE Final    Comment: (NOTE) The Xpert Xpress SARS-CoV-2/FLU/RSV plus assay is intended as an aid in the diagnosis of influenza from Nasopharyngeal swab specimens and should not be used as a sole basis for treatment. Nasal washings and aspirates are unacceptable for Xpert Xpress SARS-CoV-2/FLU/RSV testing.  Fact Sheet for Patients: EntrepreneurPulse.com.au  Fact Sheet for Healthcare Providers: IncredibleEmployment.be  This test is not yet approved or cleared by the [place] FDA and has been authorized for detection and/or diagnosis of SARS-CoV-2 by FDA under an Emergency Use Authorization (EUA). This EUA will remain in effect (meaning this test can be used) for the duration of the COVID-19 declaration under Section 564(b)(1) of the Act, 21 U.S.C. section 360bbb-3(b)(1), unless the authorization is terminated or revoked.  Performed at [facility], [place] [address], [place], [place] [address]       Studies: No results found.  Scheduled Meds:  amLODipine  10 mg Oral Daily   aspirin  325 mg Oral Daily   clopidogrel  75  mg Oral QHS   furosemide  40 mg Oral Daily   heparin  5,000 Units Subcutaneous Q12H   insulin aspart  0-9 Units Subcutaneous TID WC   metoprolol succinate  25 mg Oral Daily   sodium bicarbonate  650 mg Oral TID   tamsulosin  0.8 mg Oral QHS    Continuous Infusions:   LOS: 9 days     Cristal Deer, MD [facility]  To reach me or the doctor on call, go to: [email or web address] Password [facility]  04/19/2021, 6:14 PM

## 2021-04-19 NOTE — Plan of Care (Signed)
  Problem: Clinical Measurements: Goal: Ability to maintain clinical measurements within normal limits will improve Outcome: Progressing Goal: Will remain free from infection Outcome: Progressing Goal: Diagnostic test results will improve Outcome: Progressing Goal: Respiratory complications will improve Outcome: Progressing Goal: Cardiovascular complication will be avoided Outcome: Progressing   Problem: Activity: Goal: Risk for activity intolerance will decrease Outcome: Progressing   Problem: Elimination: Goal: Will not experience complications related to bowel motility Outcome: Progressing

## 2021-04-19 NOTE — Progress Notes (Signed)
Nurse paged triad MD for pt [place] rhythm

## 2021-04-20 LAB — GLUCOSE, CAPILLARY
Glucose-Capillary: 101 mg/dL — ABNORMAL HIGH (ref 70–99)
Glucose-Capillary: 155 mg/dL — ABNORMAL HIGH (ref 70–99)
Glucose-Capillary: 155 mg/dL — ABNORMAL HIGH (ref 70–99)
Glucose-Capillary: 158 mg/dL — ABNORMAL HIGH (ref 70–99)

## 2021-04-20 NOTE — Progress Notes (Addendum)
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  He was admitted for bilateral lower extremity weakness with severe debility and inability to walk.  PT OT examined and recommended nursing home placement.  Patient had been discharged but just waiting for insurance approval to go to SNF. Lumbar MRI showed degenerative changes  Subjective: April 19, 2021: Patient examined at bedside he is sitting in the chair recliner Denies any complaints.  He has been discharged but waiting for insurance approval  April 20, 2021 Patient seen and examined at bedside Overnight patient had a run of bigeminy he had denied chest pain and was hemodynamically stable This morning nurse noted heart rate was in the 40s and so she held the metoprolol blood pressure was however normal at 148/50 Oxygen is within normal limits His chemistry was checked it was normal and also magnesium was checked and it was normal His insurance denied the SNF Peer-to-peer review would need to be obtained for appeal  Assessment/Plan: Principal Problem:   AKI (acute kidney injury) ([place]) Active Problems:   Prostate cancer ([place])   Hypertension   General weakness   Diabetes mellitus without complication ([place])  removal of scar tissue.   GOLD SEED IMPLANT N/A 12/16/2020    Procedure: GOLD SEED IMPLANT;  Surgeon: Cleon Gustin, MD;  Location: [facility];  Service: Urology;  Laterality: N/A;  Dr. requests time 1:00  Acute on chronic kidney disease stage IIIb Continue Lasix per home  2.  Generalized weakness bilateral lower extremity weakness  ambulatory dysfunction.  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  Elevated troponin likely secondary to ischemia demand ischemia and AKI  5.  Congestive heart failure: Continue Lasix  6.  For prostate cancer.  Recently completed radiation therapy he has implanted seed gold seed implant. Continue follow-up with urology  7.  Bradycardia started this morning.  Metoprolol has been held.   I will order EKG Depending on what the EKG shows might need to consult cardiology Continue to hold metoprolol  Code Status: Full  Severity of Illness: The appropriate patient status for this patient is INPATIENT. Inpatient status is judged to be reasonable and necessary in order to provide the required intensity of service to ensure the patient's safety. The patient's presenting symptoms, physical exam findings, and initial radiographic and laboratory data in the context of their chronic comorbidities is felt to place them at high risk for further clinical deterioration. Furthermore, it is not anticipated that the patient will be medically stable for discharge from the hospital within 2 midnights of admission. The following factors support the patient status of inpatient.   " Waiting for placement and insurance approval Unsafe for discharge to home * I certify that at the point of admission it is my clinical judgment that the patient will require inpatient hospital care spanning beyond 2 midnights from the point of admission due to high intensity of service, high risk for further deterioration and high  frequency of surveillance required.*   Family Communication: None at bedside  Disposition Plan: Waiting for SNF Status is: Inpatient   Dispo: The  patient is from: Home              Anticipated d/c is to:               Anticipated d/c date is:               Patient currently not medically stable for discharge  Consultants: None  Procedures: None  Antimicrobials: None  DVT prophylaxis: Subacute heparin   Objective: Vitals:   04/20/21 0602 04/20/21 1025 04/20/21 1029 04/20/21 1927  BP: (!) 147/55 (!) 148/50  (!) 143/53  Pulse: 72 (!) 42 (!) 42 (!) 40  Resp: 18   18  Temp: 98.5 F (36.9 C) 98 F (36.7 C) 97.9 F (36.6 C) 97.7 F (36.5 C)  TempSrc: Oral Oral Oral Oral  SpO2: 100% 99%  96%  Weight: 95.5 kg     Height:        Intake/Output Summary (Last 24 hours) at 04/20/2021 2032 Last data filed at 04/20/2021 1930 Gross per 24 hour  Intake 480 ml  Output 1100 ml  Net -620 ml    Filed Weights   04/18/21 0510 04/19/21 0435 04/20/21 0602  Weight: 96.4 kg 95.4 kg 95.5 kg   Body mass index is 27.78 kg/m.  Exam:  General: 80 y.o. year-old male well developed well nourished in no acute distress.  Alert and oriented x3. Cardiovascular: Regular rate and rhythm with no rubs or gallops.  No thyromegaly or JVD noted.   Respiratory: Clear to auscultation with no wheezes or rales. Good inspiratory effort. Abdomen: Soft nontender nondistended with normal bowel sounds x4 quadrants. Musculoskeletal: No lower extremity edema. 2/4 pulses in all 4 extremities. Skin: No ulcerative lesions noted or rashes, Psychiatry: Mood is appropriate for condition and setting Neurology:    Data Reviewed: CBC: No results for input(s): WBC, NEUTROABS, HGB, HCT, MCV, PLT in the last 168 hours. Basic Metabolic Panel: Recent Labs  Lab 04/15/21 0237 04/19/21 2058 04/19/21 2200  NA 136 134*  --   K 4.0 3.9  --   CL 111 109  --   CO2 16* 19*  --   GLUCOSE 111* 208*  --   BUN 53* 37*  --   CREATININE 2.27* 2.26*  --   CALCIUM 8.8* 8.7*  --   MG  --   --  2.0    GFR: Estimated Creatinine Clearance: 29 mL/min (A) (by C-G formula  based on SCr of 2.26 mg/dL (H)). Liver Function Tests: No results for input(s): AST, ALT, ALKPHOS, BILITOT, PROT, ALBUMIN in the last 168 hours. No results for input(s): LIPASE, AMYLASE in the last 168 hours. No results for input(s): AMMONIA in the last 168 hours. Coagulation Profile: No results for input(s): INR, PROTIME in the last 168 hours. Cardiac Enzymes: No results for input(s): CKTOTAL, CKMB, CKMBINDEX, TROPONINI in the last 168 hours. BNP (last 3 results) No results for input(s): PROBNP in the last 8760 hours. HbA1C: No results for input(s): HGBA1C in the last 72 hours. CBG: Recent Labs  Lab 04/19/21 1621 04/19/21 2104 04/20/21 0612 04/20/21 1034 04/20/21 1609  GLUCAP 113* 207* 101* 155* 155*    Lipid Profile: No results for input(s): CHOL, HDL, LDLCALC, TRIG, CHOLHDL, LDLDIRECT in the last 72 hours. Thyroid Function Tests: No results for input(s): TSH, T4TOTAL, FREET4, T3FREE, THYROIDAB in the last 72  hours. Anemia Panel: No results for input(s): VITAMINB12, FOLATE, FERRITIN, TIBC, IRON, RETICCTPCT in the last 72 hours. Urine analysis:    Component Value Date/Time   COLORURINE YELLOW 04/11/2021 0814   APPEARANCEUR HAZY (A) 04/11/2021 0814   APPEARANCEUR Clear 01/01/2021 1448   LABSPEC 1.010 04/11/2021 0814   PHURINE 6.0 04/11/2021 0814   GLUCOSEU NEGATIVE 04/11/2021 0814   HGBUR NEGATIVE 04/11/2021 0814   BILIRUBINUR NEGATIVE 04/11/2021 0814   BILIRUBINUR Negative 01/01/2021 [place] 04/11/2021 0814   PROTEINUR NEGATIVE 04/11/2021 0814   NITRITE NEGATIVE 04/11/2021 0814   LEUKOCYTESUR NEGATIVE 04/11/2021 0814   Sepsis Labs: @LABRCNTIP (procalcitonin:4,lacticidven:4)  ) No results found for this or any previous visit (from the past 240 hour(s)).     Studies: No results found.  Scheduled Meds:  amLODipine  10 mg Oral Daily   aspirin  325 mg Oral Daily   clopidogrel  75 mg Oral QHS   furosemide  40 mg Oral Daily   heparin  5,000  Units Subcutaneous Q12H   insulin aspart  0-9 Units Subcutaneous TID WC   metoprolol succinate  25 mg Oral Daily   sodium bicarbonate  650 mg Oral TID   tamsulosin  0.8 mg Oral QHS    Continuous Infusions:   LOS: 10 days     Cristal Deer, MD [facility]  To reach me or the doctor on call, go to: [email or web address] Password [email or web address]  04/20/2021, 8:32 PM

## 2021-04-20 NOTE — Progress Notes (Signed)
Patient says his stomach felt tight and that he needed to sit on the toilet. RN helped patient to the bathroom, he passed a lot of gas and had a BM, then he started to vomit. RN gave PRN med and notified MD.

## 2021-04-21 ENCOUNTER — Inpatient Hospital Stay (HOSPITAL_COMMUNITY): Payer: Medicare HMO

## 2021-04-21 DIAGNOSIS — C61 Malignant neoplasm of prostate: Secondary | ICD-10-CM

## 2021-04-21 DIAGNOSIS — E119 Type 2 diabetes mellitus without complications: Secondary | ICD-10-CM

## 2021-04-21 DIAGNOSIS — R531 Weakness: Secondary | ICD-10-CM

## 2021-04-21 DIAGNOSIS — I639 Cerebral infarction, unspecified: Secondary | ICD-10-CM

## 2021-04-21 LAB — GLUCOSE, CAPILLARY
Glucose-Capillary: 138 mg/dL — ABNORMAL HIGH (ref 70–99)
Glucose-Capillary: 175 mg/dL — ABNORMAL HIGH (ref 70–99)
Glucose-Capillary: 163 mg/dL — ABNORMAL HIGH (ref 70–99)
Glucose-Capillary: 118 mg/dL — ABNORMAL HIGH (ref 70–99)

## 2021-04-21 MED ORDER — SODIUM CHLORIDE 0.9 % IV SOLN
12.5000 mg | Freq: Four times a day (QID) | INTRAVENOUS | Status: DC | PRN
  Administered 2021-04-21: 12.5 mg via INTRAVENOUS
  Filled 2021-04-21: qty 0.5

## 2021-04-21 MED ORDER — SODIUM CHLORIDE 0.9 % IV SOLN: INTRAVENOUS | Status: DC

## 2021-04-21 MED ORDER — ACETAMINOPHEN 325 MG PO TABS
650.0000 mg | ORAL_TABLET | Freq: Four times a day (QID) | ORAL | Status: DC | PRN
  Administered 2021-04-21: 650 mg via ORAL
  Filled 2021-04-21: qty 2

## 2021-04-21 NOTE — Progress Notes (Signed)
OT Cancellation Note  Patient Details Name: Thomas Duarte MRN: [id number] DOB: 16-Aug-1941   Cancelled Treatment:    Reason Eval/Treat Not Completed: Medical issues which prohibited therapy (Pt with nausea and vomiting today. Will continue to follow.)  [name] 04/21/2021, 3:14 PM Nestor Lewandowsky, OTR/L Acute Rehabilitation Services Pager: [phone] Office: [phone]

## 2021-04-21 NOTE — Care Management Important Message (Signed)
Important Message  Patient Details  Name: RONDO SPITTLER MRN: [id number] Date of Birth: 11/25/1940   Medicare Important Message Given:  Yes     Shelda Altes 04/21/2021, 8:54 AM

## 2021-04-21 NOTE — TOC Progression Note (Signed)
Transition of Care [facility]) - Progression Note    Patient Details  Name: Thomas Duarte MRN: [id number] Date of Birth: 08-12-1941  Transition of Care [facility]) CM/SW [place], [place] Phone Number: 04/21/2021, 12:41 PM  Clinical Narrative:     CSW spoke with MD who confirmed he will complete peer to peer today. CSW will follow up with determination tomorrow. Patient has SNF bed at [facility]. CSW will continue to follow and assist with dc planning needs.  Expected Discharge Plan: [place] Barriers to Discharge: Continued Medical Work up  Expected Discharge Plan and Services Expected Discharge Plan: [place] In-house Referral: Clinical Social Work   Post Acute Care Choice: [place] Living arrangements for the past 2 months: Single Family Home Expected Discharge Date: 04/16/21                                     Social Determinants of Health (SDOH) Interventions    Readmission Risk Interventions No flowsheet data found.

## 2021-04-21 NOTE — Progress Notes (Addendum)
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  At this time patient is awaiting for skilled nursing facility placement but has been denied. on 04/21/2021 patient started having multiple episodes of nausea vomiting.  X-ray of the abdomen showing some ileus-like features.   Assessment/Plan:  Nausea vomiting despite IV antiemetics.  Abdominal x-ray shows some features of ileus.  No fluid levels.  We will keep the patient n.p.o., IV fluids NG tube if continues to vomit.  Check abdominal x-ray in a.m.  CKD stage IIIb.  We will hold Lasix for now.  Has been started on normal saline due to n.p.o. status  Generalized weakness with ambulatory dysfunction.  Physical therapy has recommended skilled nursing facility placement but insurance has declined.  We will continue to monitor closely while in the hospital. MRI showed multilevel lumbar disc disease. Patient will follow-up with neurosurgery as outpatient  Urinary retention/ incontinence History of prostate cancer s/p radiation.  Elevated troponin likely secondary to ischemia demand ischemia and AKI  Congestive heart failure: Was on Lasix on hold.  Has been resumed on IV fluids due to n.p.o. status  History of prostate cancer.  Status post radiation treatment.  Follows up with urology as outpatient.   Bradycardia. Metoprolol has been held. EKG showing PVCs and bigeminy pattern.  We will continue to monitor.  Code Status: Full  Family Communication:  I spoke with the patient's spouse on the phone at home and updated her about the clinical condition.  Disposition Plan:    Status is: Inpatient   Dispo: The patient is from: Home              Anticipated d/c is to: Skilled nursing facility.              Anticipated d/c date is: Unknown at this time              Patient currently not medically stable for discharge  Consultants: None  Procedures: None  Antimicrobials: None  DVT prophylaxis: Heparin subcu  Subjective Today, patient was seen and examined at bedside.  Nursing staff reported that the patient was having multiple episodes of nausea and vomiting.  Abdominal x-ray showed some findings of ileus.  Patient stated that he had bowel movement yesterday.  Denies any chest pain, dizziness, shortness of breath.  Feels nauseated.  Objective: Vitals:   04/20/21 1927 04/21/21 0141 04/21/21 0413 04/21/21 1112  BP: (!) 143/53  (!) 145/53 (!) 149/75  Pulse: (!) 40  (!) 41 89  Resp: 18  18 18   Temp: 97.7 F (36.5 C)  98.3 F (36.8 C) 98.5 F (36.9 C)  TempSrc: Oral  Oral Oral  SpO2: 96%  97% 97%  Weight:  95.6 kg    Height:        Intake/Output Summary (Last 24 hours) at 04/21/2021 1310 Last data filed at 04/21/2021 0800 Gross per 24 hour  Intake 720 ml  Output 800 ml  Net -80 ml    Filed Weights   04/19/21 0435 04/20/21 0602 04/21/21 0141  Weight: 95.4 kg 95.5 kg 95.6 kg   Body mass index is 27.8 kg/m.  Physical exam: General:  Average built, not  in obvious distress HENT:   No scleral pallor or icterus noted. Oral mucosa is moist.  Chest:  Clear breath sounds.  Diminished breath sounds bilaterally. No crackles or wheezes.  CVS: S1 &S2 heard. No murmur.  Regular rate and rhythm. Abdomen: Soft, mildly distended abdomen, nontender,   Bowel sounds are heard.   Extremities: No cyanosis, clubbing or edema.  Peripheral pulses are palpable. Psych: Alert, awake and oriented, normal mood CNS:  No cranial nerve deficits.  Power equal in all extremities.   Skin: Warm and dry.  No rashes noted.   Data Reviewed: CBC: No results for input(s):  WBC, NEUTROABS, HGB, HCT, MCV, PLT in the last 168 hours. Basic Metabolic Panel: Recent Labs  Lab 04/15/21 0237 04/19/21 2058 04/19/21 2200  NA 136 134*  --   K 4.0 3.9  --   CL 111 109  --   CO2 16* 19*  --   GLUCOSE 111* 208*  --   BUN 53* 37*  --   CREATININE 2.27* 2.26*  --   CALCIUM 8.8* 8.7*  --   MG  --   --  2.0    GFR: Estimated Creatinine Clearance: 29 mL/min (A) (by C-G formula based on SCr of 2.26 mg/dL (H)). Liver Function Tests: No results for input(s): AST, ALT, ALKPHOS, BILITOT, PROT, ALBUMIN in the last 168 hours. No results for input(s): LIPASE, AMYLASE in the last 168 hours. No results for input(s): AMMONIA in the last 168 hours. Coagulation Profile: No results for input(s): INR, PROTIME in the last 168 hours. Cardiac Enzymes: No results for input(s): CKTOTAL, CKMB, CKMBINDEX, TROPONINI in the last 168 hours. BNP (last 3 results) No results for input(s): PROBNP in the last 8760 hours. HbA1C: No results for input(s): HGBA1C in the last 72 hours. CBG: Recent Labs  Lab 04/20/21 1034 04/20/21 1609 04/20/21 2103 04/21/21 0603 04/21/21 1110  GLUCAP 155* 155* 158* 138* 175*    Lipid Profile: No results for input(s): CHOL, HDL, LDLCALC, TRIG, CHOLHDL, LDLDIRECT in the last 72 hours. Thyroid Function Tests: No results for input(s): TSH, T4TOTAL, FREET4, T3FREE, THYROIDAB in the last 72 hours. Anemia Panel: No results for input(s): VITAMINB12, FOLATE, FERRITIN, TIBC, IRON, RETICCTPCT in the last 72 hours. Urine analysis:    Component Value Date/Time   COLORURINE YELLOW 04/11/2021 0814   APPEARANCEUR HAZY (A) 04/11/2021 0814   APPEARANCEUR Clear 01/01/2021 1448   LABSPEC 1.010 04/11/2021 0814   PHURINE 6.0 04/11/2021 0814   GLUCOSEU NEGATIVE 04/11/2021 0814   HGBUR NEGATIVE 04/11/2021 0814   BILIRUBINUR NEGATIVE 04/11/2021 0814   BILIRUBINUR Negative 01/01/2021 [place] 04/11/2021 0814   PROTEINUR NEGATIVE 04/11/2021 0814    NITRITE NEGATIVE 04/11/2021 0814   LEUKOCYTESUR NEGATIVE 04/11/2021 0814   Sepsis Labs: @LABRCNTIP (procalcitonin:4,lacticidven:4)  ) No results found for this or any previous visit (from the past 240 hour(s)).     Studies: DG Abd Portable 1V  Result Date: 04/21/2021 CLINICAL DATA:  Vomiting, abdominal pressure, inpatient EXAM: PORTABLE ABDOMEN - 1 VIEW COMPARISON:  None. FINDINGS: Moderately dilated small bowel loops throughout the central abdomen up to the 5.0 cm diameter. Mild colonic stool and gas. No evidence of pneumatosis or pneumoperitoneum. Mild gastric distension. Clear lung bases. No radiopaque nephrolithiasis. Moderate lumbar spondylosis. IMPRESSION: Moderately dilated small bowel loops throughout the central abdomen and mild gastric distension, suspicious for distal small bowel obstruction. No evidence of pneumatosis or pneumoperitoneum. CT abdomen/pelvis with oral and IV contrast may be obtained for further evaluation  as clinically warranted. Electronically Signed   By: Ilona Sorrel M.D.   On: 04/21/2021 07:36    Scheduled Meds:  amLODipine  10 mg Oral Daily   aspirin  325 mg Oral Daily   clopidogrel  75 mg Oral QHS   furosemide  40 mg Oral Daily   heparin  5,000 Units Subcutaneous Q12H   insulin aspart  0-9 Units Subcutaneous TID WC   metoprolol succinate  25 mg Oral Daily   sodium bicarbonate  650 mg Oral TID   tamsulosin  0.8 mg Oral QHS    Continuous Infusions:  sodium chloride 75 mL/hr at 04/21/21 1039   promethazine (PHENERGAN) injection (IM or IVPB) 12.5 mg (04/21/21 1036)     LOS: 11 days     Flora Lipps, MD [facility] 04/21/2021, 1:10 PM

## 2021-04-21 NOTE — Plan of Care (Signed)

## 2021-04-21 NOTE — Progress Notes (Signed)
PT Cancellation Note  Patient Details Name: Thomas Duarte MRN: [id number] DOB: 1941-01-16   Cancelled Treatment:    Reason Eval/Treat Not Completed: Medical issues which prohibited therapy Spoke with RN and pt has had several episodes of nausea and vomitting today and is still nauseated.  Not appropriate for therapy at this time.  Abran Richard, PT Acute Rehab Services Pager [phone] [facility] [place] 04/21/2021, 12:53 PM

## 2021-04-22 ENCOUNTER — Inpatient Hospital Stay (HOSPITAL_COMMUNITY): Payer: Medicare HMO

## 2021-04-22 ENCOUNTER — Encounter (HOSPITAL_COMMUNITY): Payer: Self-pay | Admitting: Internal Medicine

## 2021-04-22 LAB — CBC
HCT: 25.3 % — ABNORMAL LOW (ref 39.0–52.0)
Hemoglobin: 8.2 g/dL — ABNORMAL LOW (ref 13.0–17.0)
MCH: 31.3 pg (ref 26.0–34.0)
MCHC: 32.4 g/dL (ref 30.0–36.0)
MCV: 96.6 fL (ref 80.0–100.0)
Platelets: 181 10*3/uL (ref 150–400)
RBC: 2.62 MIL/uL — ABNORMAL LOW (ref 4.22–5.81)
RDW: 13.6 % (ref 11.5–15.5)
WBC: 7.1 10*3/uL (ref 4.0–10.5)
nRBC: 0 % (ref 0.0–0.2)

## 2021-04-22 LAB — BASIC METABOLIC PANEL
Anion gap: 7 (ref 5–15)
BUN: 40 mg/dL — ABNORMAL HIGH (ref 8–23)
CO2: 26 mmol/L (ref 22–32)
Calcium: 8.8 mg/dL — ABNORMAL LOW (ref 8.9–10.3)
Chloride: 108 mmol/L (ref 98–111)
Creatinine, Ser: 2.54 mg/dL — ABNORMAL HIGH (ref 0.61–1.24)
GFR, Estimated: 25 mL/min — ABNORMAL LOW (ref >60)
Glucose, Bld: 119 mg/dL — ABNORMAL HIGH (ref 70–99)
Potassium: 3.9 mmol/L (ref 3.5–5.1)
Sodium: 141 mmol/L (ref 135–145)

## 2021-04-22 LAB — GLUCOSE, CAPILLARY
Glucose-Capillary: 112 mg/dL — ABNORMAL HIGH (ref 70–99)
Glucose-Capillary: 124 mg/dL — ABNORMAL HIGH (ref 70–99)
Glucose-Capillary: 103 mg/dL — ABNORMAL HIGH (ref 70–99)
Glucose-Capillary: 116 mg/dL — ABNORMAL HIGH (ref 70–99)

## 2021-04-22 LAB — PHOSPHORUS: Phosphorus: 4.1 mg/dL (ref 2.5–4.6)

## 2021-04-22 LAB — MAGNESIUM: Magnesium: 2.1 mg/dL (ref 1.7–2.4)

## 2021-04-22 MED ORDER — IOHEXOL 9 MG/ML PO SOLN
500.0000 mL | ORAL | Status: AC
Start: 2021-04-22 — End: 2021-04-22
  Administered 2021-04-22 (×2): 500 mL via ORAL

## 2021-04-22 NOTE — Progress Notes (Addendum)
Patient started to work with OT and the patient had a significant coughing fit and the ng tube became dislodged from nose. Paused suction. Was able to reapply the holder to nose and advance the ng tube to the measurement mark. No lung sounds through tube and gastric juices in tube. Notified MD, order for xray obtained. Xray confirmed placement. Intermittent suction restarted.

## 2021-04-22 NOTE — Progress Notes (Signed)
Patient having multi focal pvc's. MD is aware

## 2021-04-22 NOTE — TOC Progression Note (Signed)
Transition of Care [facility]) - Progression Note    Patient Details  Name: Thomas Duarte MRN: [id number] Date of Birth: 1941-01-21  Transition of Care [facility]) CM/SW [place], [place] Phone Number: 04/22/2021, 3:15 PM  Clinical Narrative:     CSW spoke with Edwena Blow with [facility] who reported to CSW that peer to peer was approved. Reference number # [id number]. Approval is from 9/6-9/12. Next review date is 9/12. CSW informed MD. Patient has SNF bed at [facility] when medically ready.  Expected Discharge Plan: [place] Barriers to Discharge: Continued Medical Work up  Expected Discharge Plan and Services Expected Discharge Plan: [place] In-house Referral: Clinical Social Work   Post Acute Care Choice: [place] Living arrangements for the past 2 months: Single Family Home Expected Discharge Date: 04/16/21                                     Social Determinants of Health (SDOH) Interventions    Readmission Risk Interventions No flowsheet data found.

## 2021-04-22 NOTE — Progress Notes (Signed)
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  At this time, patient is awaiting for skilled nursing facility placement but has been denied. On 04/21/2021, patient started having multiple episodes of nausea, vomiting.  X-ray of the abdomen showing some ileus-like features.   Assessment/Plan:  Nausea vomiting despite IV antiemetics.  Abdominal x-ray showed some features of ileus.  No fluid levels. Will continue to keep the patient n.p.o., IV fluids, continue NG tube with intermittent suction.  Abdominal x-ray noted with distended bowel.  Potassium of 3.9.  Magnesium 2.1.  We will continue to monitor closely.  Patient does have a history of exploratory laparotomy in the past as listed in the computer.  He did have some gas.  Spoke with general surgery regarding the current situation for further guidance.  CKD stage IIIb.  Continue to hold the Lasix..  Continue normal saline.  Generalized weakness with ambulatory dysfunction.  Physical therapy has recommended skilled nursing facility placement but insurance has declined.  We will continue to monitor closely while in the hospital. MRI showed multilevel lumbar disc disease. Patient will follow-up with neurosurgery as outpatient  Urinary retention/ incontinence History of prostate cancer s/p radiation.  Elevated troponin likely secondary to ischemia demand ischemia and AKI.  Unlikely to be secondary to acute coronary syndrome.  Congestive heart failure: Was on Lasix on hold.  Has been resumed on IV fluids due to n.p.o. status  History of prostate cancer.  Status post  radiation treatment.  Follows up with urology as outpatient.   Bradycardia. Metoprolol has been held. EKG showing PVCs and bigeminy pattern.  We will continue to monitor.  History of stroke.  On aspirin and Plavix.  Currently NPO.  Code Status: Full  Family Communication:  None today.  I spoke with the patient's spouse on the phone at home and updated her about the clinical condition yesterday.  Disposition Plan:   Status is: Inpatient   Dispo: The patient is from: Home              Anticipated d/c is to: Skilled nursing facility.              Anticipated d/c date is: Unknown at this time, I have called the insurance company this morning and awaiting for callback for peer-to-peer.              Patient currently not medically stable for discharge  Consultants: Surgery  Procedures: NG tube placement  Antimicrobials: None  DVT prophylaxis: Heparin subcu  Subjective Today, patient was seen and examined at bedside.  Patient any nausea vomiting or overt abdominal pain.  Had passed some gas but no bowel movement.  Still on NG tube.  Objective: Vitals:   04/21/21 1112 04/21/21 2037 04/22/21 0604 04/22/21 1121  BP: (!) 149/75 (!) 154/70 140/61 (!) 151/61  Pulse: 89 79 80 72  Resp: 18 20 20 19   Temp: 98.5 F (36.9 C) (!) 97.5 F (36.4 C) 99.4 F (37.4 C) 97.8 F (36.6 C)  TempSrc: Oral Oral Oral Oral  SpO2: 97% 97% (!) 89% 94%  Weight:   96.5 kg   Height:        Intake/Output Summary (  Last 24 hours) at 04/22/2021 1333 Last data filed at 04/22/2021 0300 Gross per 24 hour  Intake 1048.73 ml  Output 1350 ml  Net -301.27 ml    Filed Weights   04/20/21 0602 04/21/21 0141 04/22/21 0604  Weight: 95.5 kg 95.6 kg 96.5 kg   Body mass index is 28.07 kg/m.  Physical exam:  General:  Average built, not in obvious distress, NG tube in place HENT:   No scleral pallor or icterus noted. Oral mucosa is dry Chest:  Clear breath sounds.  Diminished breath sounds bilaterally. No  crackles or wheezes.  CVS: S1 &S2 heard. No murmur.  Regular rate and rhythm. Abdomen: Soft,  nontender,   Bowel sounds are heard.   Extremities: No cyanosis, clubbing or edema.  Peripheral pulses are palpable. Psych: Alert, awake and oriented, normal mood CNS:  No cranial nerve deficits.  Power equal in all extremities.   Skin: Warm and dry.  No rashes noted.  Data Reviewed: CBC: Recent Labs  Lab 04/22/21 0341  WBC 7.1  HGB 8.2*  HCT 25.3*  MCV 96.6  PLT 098   Basic Metabolic Panel: Recent Labs  Lab 04/19/21 2058 04/19/21 2200 04/22/21 0341  NA 134*  --  141  K 3.9  --  3.9  CL 109  --  108  CO2 19*  --  26  GLUCOSE 208*  --  119*  BUN 37*  --  40*  CREATININE 2.26*  --  2.54*  CALCIUM 8.7*  --  8.8*  MG  --  2.0 2.1  PHOS  --   --  4.1    GFR: Estimated Creatinine Clearance: 27.9 mL/min (A) (by C-G formula based on SCr of 2.54 mg/dL (H)). Liver Function Tests: No results for input(s): AST, ALT, ALKPHOS, BILITOT, PROT, ALBUMIN in the last 168 hours. No results for input(s): LIPASE, AMYLASE in the last 168 hours. No results for input(s): AMMONIA in the last 168 hours. Coagulation Profile: No results for input(s): INR, PROTIME in the last 168 hours. Cardiac Enzymes: No results for input(s): CKTOTAL, CKMB, CKMBINDEX, TROPONINI in the last 168 hours. BNP (last 3 results) No results for input(s): PROBNP in the last 8760 hours. HbA1C: No results for input(s): HGBA1C in the last 72 hours. CBG: Recent Labs  Lab 04/21/21 1110 04/21/21 1532 04/21/21 2112 04/22/21 0600 04/22/21 1122  GLUCAP 175* 163* 118* 112* 124*    Lipid Profile: No results for input(s): CHOL, HDL, LDLCALC, TRIG, CHOLHDL, LDLDIRECT in the last 72 hours. Thyroid Function Tests: No results for input(s): TSH, T4TOTAL, FREET4, T3FREE, THYROIDAB in the last 72 hours. Anemia Panel: No results for input(s): VITAMINB12, FOLATE, FERRITIN, TIBC, IRON, RETICCTPCT in the last 72 hours. Urine  analysis:    Component Value Date/Time   COLORURINE YELLOW 04/11/2021 0814   APPEARANCEUR HAZY (A) 04/11/2021 0814   APPEARANCEUR Clear 01/01/2021 1448   LABSPEC 1.010 04/11/2021 0814   PHURINE 6.0 04/11/2021 0814   GLUCOSEU NEGATIVE 04/11/2021 0814   HGBUR NEGATIVE 04/11/2021 0814   BILIRUBINUR NEGATIVE 04/11/2021 0814   BILIRUBINUR Negative 01/01/2021 [place] 04/11/2021 0814   PROTEINUR NEGATIVE 04/11/2021 0814   NITRITE NEGATIVE 04/11/2021 0814   LEUKOCYTESUR NEGATIVE 04/11/2021 0814   Sepsis Labs: @LABRCNTIP (procalcitonin:4,lacticidven:4)  ) No results found for this or any previous visit (from the past 240 hour(s)).     Studies: DG Abd 1 View  Result Date: 04/22/2021 CLINICAL DATA:  Ileus. EXAM: ABDOMEN - 1 VIEW COMPARISON:  April 21, 2021. FINDINGS: Stable small bowel dilatation is noted most consistent with postoperative ileus. No colonic dilatation is noted. Distal tip of nasogastric tube is seen in expected position of distal stomach. IMPRESSION: Stable small bowel dilatation is noted most consistent postoperative ileus. Electronically Signed   By: Marijo Conception M.D.   On: 04/22/2021 08:21   DG Abd 1 View  Result Date: 04/21/2021 CLINICAL DATA:  Encounter for NG tube placement EXAM: ABDOMEN - 1 VIEW COMPARISON:  One view abdomen 04/21/2021 at 1:48 p.m. FINDINGS: NG tube was advanced and the side port is now in the stomach. Gaseous distension of small bowel noted. No free air. Heart size is normal. Lung bases are clear. IMPRESSION: 1. NG tube advanced and the side port is now in the stomach. 2. Gaseous distension of small bowel. Electronically Signed   By: San Morelle M.D.   On: 04/21/2021 14:52   DG Abd Portable 1V  Result Date: 04/21/2021 CLINICAL DATA:  Nasogastric tube placement, vomiting EXAM: PORTABLE ABDOMEN - 1 VIEW COMPARISON:  Portable exam 1348 hours compared to 0723 hours FINDINGS: Tip of nasogastric tube projects over distal  esophagus; recommend advancing tube 12 cm to place proximal side-port within the stomach. Dilated bowel loops in upper abdomen. Gaseous distention of stomach. Atherosclerotic calcification aorta. IMPRESSION: Recommend advancing nasogastric tube 12 cm to place proximal side-port within stomach. Aortic Atherosclerosis (ICD10-I70.0). Electronically Signed   By: Lavonia Dana M.D.   On: 04/21/2021 14:08    Scheduled Meds:  amLODipine  10 mg Oral Daily   aspirin  325 mg Oral Daily   clopidogrel  75 mg Oral QHS   furosemide  40 mg Oral Daily   heparin  5,000 Units Subcutaneous Q12H   insulin aspart  0-9 Units Subcutaneous TID WC   metoprolol succinate  25 mg Oral Daily   sodium bicarbonate  650 mg Oral TID   tamsulosin  0.8 mg Oral QHS    Continuous Infusions:  sodium chloride 75 mL/hr at 04/22/21 0300   promethazine (PHENERGAN) injection (IM or IVPB) Stopped (04/21/21 1930)     LOS: 12 days     Flora Lipps, MD [facility] 04/22/2021, 1:33 PM

## 2021-04-22 NOTE — TOC Progression Note (Signed)
Transition of Care [facility]) - Progression Note    Patient Details  Name: Thomas Duarte MRN: [id number] Date of Birth: January 30, 1941  Transition of Care [facility]) CM/SW [place], [place] Phone Number: 04/22/2021, 11:05 AM  Clinical Narrative:     CSW checked on status with MD on peer to peer. MD confirmed he is awaiting callback from patients insurance. CSW awaiting determination of patients peer to peer. Patient has SN[facility] bed at [facility]. CSW will continue to follow and assist with dc planning needs.  Expected Discharge Plan: [place] Barriers to Discharge: Continued Medical Work up  Expected Discharge Plan and Services Expected Discharge Plan: [place] In-house Referral: Clinical Social Work   Post Acute Care Choice: [place] Living arrangements for the past 2 months: Single Family Home Expected Discharge Date: 04/16/21                                     Social Determinants of Health (SDOH) Interventions    Readmission Risk Interventions No flowsheet data found.

## 2021-04-22 NOTE — Progress Notes (Addendum)
PT Cancellation Note  Patient Details Name: Thomas Duarte MRN: [id number] DOB: 03/29/1941   Cancelled Treatment:    Reason Eval/Treat Not Completed: (P) Medical issues which prohibited therapy Pt NG tube has become dislodged and he is headed to x-ray for replacement.  PT will follow back after NG tube has been replaced.  Emmalynne Courtney B. Migdalia Dk PT, DPT Acute Rehabilitation Services Pager [phone] Office [phone]    [place] 04/22/2021, 9:38 AM

## 2021-04-22 NOTE — Consult Note (Signed)
Thomas Duarte 10-Feb-1941  [id number].    Requesting MD: Dr. Flora Lipps Chief Complaint/Reason for Consult: N/V, abdominal distention  HPI:  This is an 80 yo white male with a history of HTN, CVA x2 on plavix, prostate cancer, s/p treatment, BPH with urinary frequency, HLD, CHF, CKD and DM who was admitted a week and a half ago secondary to BLE weakness that has been going on for the the last several weeks.  He has been having issues with urinary frequency as well.  It appears he was noted to have acute on chronic kidney failure with urinary incontinence and has been treated for that while here.  Sunday he developed some nausea and abdominal bloating.  He began to have vomiting yesterday that was significant.  He has had an NGT placed with 1300cc documented out and about 400cc in the cannister currently.  He passed flatus yesterday as well as a BM.  He denies either of these today.  He denies any abdominal pain.  He feels better with his NGT in place. He had a plain film yesterday with small bowel dilatation.  No CT scan has been done.  He does have a history of an ex lap with LOA and meckel's diverticulectomy a couple of years ago.  We have been asked to see him.  ROS: ROS: Please see HPI, otherwise all other systems have been reviewed and are negative except some numbness secondary to his prior CVA in his right pointer and middle fingers.  History reviewed. No pertinent family history.  Past Medical History:  Diagnosis Date   BPH (benign prostatic hyperplasia)    Diabetes mellitus without complication ([place])    Hypercholesteremia    Hypertension    Stroke ([place]) 2019   x2; numbness of right thumb and pointer and middle finger, no other deficits    Past Surgical History:  Procedure Laterality Date   CATARACT EXTRACTION W/PHACO Left 10/21/2018   Procedure: CATARACT EXTRACTION PHACO AND INTRAOCULAR LENS PLACEMENT LEFT EYE;  Surgeon: Baruch Goldmann, MD;  Location: [facility];   Service: Ophthalmology;  Laterality: Left;  CDE: 5.18   CATARACT EXTRACTION W/PHACO Right 06/23/2019   Procedure: CATARACT EXTRACTION PHACO AND INTRAOCULAR LENS PLACEMENT (IOC);  Surgeon: Baruch Goldmann, MD;  Location: [facility];  Service: Ophthalmology;  Laterality: Right;  CDE: 15.0   EXPLORATORY LAPAROTOMY     LOA, 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 requests time 1:00   HERNIA REPAIR Left 1960   Inguinal   IR GENERIC HISTORICAL  04/28/2016   IR RADIOLOGIST EVAL & MGMT 04/28/2016 [facility]INTERV RAD   SPACE OAR INSTILLATION N/A 12/16/2020   Procedure: SPACE OAR INSTILLATION;  Surgeon: Cleon Gustin, MD;  Location: [facility];  Service: Urology;  Laterality: N/A;   TONSILLECTOMY  1952   adenoidectomy    Social History:  reports that he has never smoked. He has never used smokeless tobacco. He reports that he does not drink alcohol and does not use drugs.  Allergies:  Allergies  Allergen Reactions   Cucumber Extract     Medications Prior to Admission  Medication Sig Dispense Refill   aspirin 325 MG tablet Take 325 mg by mouth daily.     clopidogrel (PLAVIX) 75 MG tablet Take 75 mg by mouth at bedtime.  Glycerin-Hypromellose-PEG 400 0.2-0.2-1 % SOLN Place 1 drop into both eyes 3 (three) times daily as needed (dry eyes).     ibuprofen (ADVIL) 200 MG tablet Take 400 mg by mouth at bedtime as needed for mild pain.     KLOR-CON M10 10 MEQ tablet Take 10 mEq by mouth 2 (two) times daily.     lisinopril (PRINIVIL,ZESTRIL) 40 MG tablet Take 40 mg by mouth daily.     metFORMIN (GLUCOPHAGE) 500 MG tablet Take 500 mg by mouth daily with breakfast.     metoprolol succinate (TOPROL-XL) 25 MG 24 hr tablet Take 25 mg by mouth daily.     OVER THE COUNTER MEDICATION Apply 1 application topically 2 (two)  times daily as needed (pain). Real Time Pain Relief     tamsulosin (FLOMAX) 0.4 MG CAPS capsule Take 0.8 mg by mouth at bedtime.      tobramycin (TOBREX) 0.3 % ophthalmic solution Place 1 drop into both eyes every 4 (four) hours as needed (after eye injections until healed).     [DISCONTINUED] furosemide (LASIX) 40 MG tablet Take 40 mg by mouth 2 (two) times daily.     Lancets (ONETOUCH DELICA PLUS [id number]) MISC SMARTSIG:1 Strip(s) Topical Daily     ONETOUCH VERIO test strip 1 each daily.       Physical Exam: Blood pressure (!) 151/61, pulse 72, temperature 97.8 F (36.6 C), temperature source Oral, resp. rate 19, height 6\' 1"  (1.854 m), weight 96.5 kg, SpO2 94 %. General: pleasant, WD, WN, elderly white male who is laying in bed in NAD HEENT: head is normocephalic, atraumatic.  Sclera are noninjected.  PERRL.  Ears and nose without any masses or lesions.  Mouth is pink and moist Heart: regular, rate, and rhythm.  Normal s1,s2. No obvious murmurs, gallops, or rubs noted.  Palpable radial and pedal pulses bilaterally Lungs: CTAB, no wheezes, rhonchi, or rales noted.  Respiratory effort nonlabored Abd: NT, milder upper abdominal distention but overall very soft, +BS, no masses, hernias, or organomegaly.  NGT in place with some bilious output in the cannister MS: all 4 extremities are symmetrical with no cyanosis, clubbing, or edema. Skin: warm and dry with no masses, lesions, or rashes Neuro: Cranial nerves 2-12 grossly intact, sensation is normal throughout Psych: A&Ox3 with an appropriate affect.   Results for orders placed or performed during the hospital encounter of 04/10/21 (from the past 48 hour(s))  Glucose, capillary     Status: Abnormal   Collection Time: 04/20/21  4:09 PM  Result Value Ref Range   Glucose-Capillary 155 (H) 70 - 99 mg/dL    Comment: Glucose reference range applies only to samples taken after fasting for at least 8 hours.  Glucose, capillary     Status: Abnormal    Collection Time: 04/20/21  9:03 PM  Result Value Ref Range   Glucose-Capillary 158 (H) 70 - 99 mg/dL    Comment: Glucose reference range applies only to samples taken after fasting for at least 8 hours.   Comment 1 Notify RN    Comment 2 Document in Chart   Glucose, capillary     Status: Abnormal   Collection Time: 04/21/21  6:03 AM  Result Value Ref Range   Glucose-Capillary 138 (H) 70 - 99 mg/dL    Comment: Glucose reference range applies only to samples taken after fasting for at least 8 hours.   Comment 1 Notify RN    Comment 2 Document in Chart   Glucose, capillary  Status: Abnormal   Collection Time: 04/21/21 11:10 AM  Result Value Ref Range   Glucose-Capillary 175 (H) 70 - 99 mg/dL    Comment: Glucose reference range applies only to samples taken after fasting for at least 8 hours.  Glucose, capillary     Status: Abnormal   Collection Time: 04/21/21  3:32 PM  Result Value Ref Range   Glucose-Capillary 163 (H) 70 - 99 mg/dL    Comment: Glucose reference range applies only to samples taken after fasting for at least 8 hours.  Glucose, capillary     Status: Abnormal   Collection Time: 04/21/21  9:12 PM  Result Value Ref Range   Glucose-Capillary 118 (H) 70 - 99 mg/dL    Comment: Glucose reference range applies only to samples taken after fasting for at least 8 hours.  Basic metabolic panel     Status: Abnormal   Collection Time: 04/22/21  3:41 AM  Result Value Ref Range   Sodium 141 135 - 145 mmol/L   Potassium 3.9 3.5 - 5.1 mmol/L   Chloride 108 98 - 111 mmol/L   CO2 26 22 - 32 mmol/L   Glucose, Bld 119 (H) 70 - 99 mg/dL    Comment: Glucose reference range applies only to samples taken after fasting for at least 8 hours.   BUN 40 (H) 8 - 23 mg/dL   Creatinine, Ser 2.54 (H) 0.61 - 1.24 mg/dL   Calcium 8.8 (L) 8.9 - 10.3 mg/dL   GFR, Estimated 25 (L) >60 mL/min    Comment: (NOTE) Calculated using the CKD-EPI Creatinine Equation (2021)    Anion gap 7 5 - 15     Comment: Performed at [facility] [address], [place], [place] [address]  CBC     Status: Abnormal   Collection Time: 04/22/21  3:41 AM  Result Value Ref Range   WBC 7.1 4.0 - 10.5 K/uL   RBC 2.62 (L) 4.22 - 5.81 MIL/uL   Hemoglobin 8.2 (L) 13.0 - 17.0 g/dL   HCT 25.3 (L) 39.0 - 52.0 %   MCV 96.6 80.0 - 100.0 fL   MCH 31.3 26.0 - 34.0 pg   MCHC 32.4 30.0 - 36.0 g/dL   RDW 13.6 11.5 - 15.5 %   Platelets 181 150 - 400 K/uL   nRBC 0.0 0.0 - 0.2 %    Comment: Performed at [facility], [place] [address], [place], [place] [address]  Magnesium     Status: None   Collection Time: 04/22/21  3:41 AM  Result Value Ref Range   Magnesium 2.1 1.7 - 2.4 mg/dL    Comment: Performed at [facility] [address] [place], [place] [address]  Phosphorus     Status: None   Collection Time: 04/22/21  3:41 AM  Result Value Ref Range   Phosphorus 4.1 2.5 - 4.6 mg/dL    Comment: Performed at [facility] [address], [place], [place] [address]  Glucose, capillary     Status: Abnormal   Collection Time: 04/22/21  6:00 AM  Result Value Ref Range   Glucose-Capillary 112 (H) 70 - 99 mg/dL    Comment: Glucose reference range applies only to samples taken after fasting for at least 8 hours.  Glucose, capillary     Status: Abnormal   Collection Time: 04/22/21 11:22 AM  Result Value Ref Range   Glucose-Capillary 124 (H) 70 - 99 mg/dL    Comment: Glucose reference  range applies only to samples taken after fasting for at least 8 hours.   DG Abd 1 View  Result Date: 04/22/2021 CLINICAL DATA:  Ileus. EXAM: ABDOMEN - 1 VIEW COMPARISON:  April 21, 2021. FINDINGS: Stable small bowel dilatation is noted most consistent with postoperative ileus. No colonic dilatation is noted. Distal tip of nasogastric tube is seen in expected position of distal stomach. IMPRESSION: Stable small bowel dilatation is noted most consistent postoperative ileus. Electronically Signed   By: Marijo Conception  M.D.   On: 04/22/2021 08:21   DG Abd 1 View  Result Date: 04/21/2021 CLINICAL DATA:  Encounter for NG tube placement EXAM: ABDOMEN - 1 VIEW COMPARISON:  One view abdomen 04/21/2021 at 1:48 p.m. FINDINGS: NG tube was advanced and the side port is now in the stomach. Gaseous distension of small bowel noted. No free air. Heart size is normal. Lung bases are clear. IMPRESSION: 1. NG tube advanced and the side port is now in the stomach. 2. Gaseous distension of small bowel. Electronically Signed   By: San Morelle M.D.   On: 04/21/2021 14:52   DG Abd Portable 1V  Result Date: 04/21/2021 CLINICAL DATA:  Nasogastric tube placement, vomiting EXAM: PORTABLE ABDOMEN - 1 VIEW COMPARISON:  Portable exam 1348 hours compared to 0723 hours FINDINGS: Tip of nasogastric tube projects over distal esophagus; recommend advancing tube 12 cm to place proximal side-port within the stomach. Dilated bowel loops in upper abdomen. Gaseous distention of stomach. Atherosclerotic calcification aorta. IMPRESSION: Recommend advancing nasogastric tube 12 cm to place proximal side-port within stomach. Aortic Atherosclerosis (ICD10-I70.0). Electronically Signed   By: Lavonia Dana M.D.   On: 04/21/2021 14:08   DG Abd Portable 1V  Result Date: 04/21/2021 CLINICAL DATA:  Vomiting, abdominal pressure, inpatient EXAM: PORTABLE ABDOMEN - 1 VIEW COMPARISON:  None. FINDINGS: Moderately dilated small bowel loops throughout the central abdomen up to the 5.0 cm diameter. Mild colonic stool and gas. No evidence of pneumatosis or pneumoperitoneum. Mild gastric distension. Clear lung bases. No radiopaque nephrolithiasis. Moderate lumbar spondylosis. IMPRESSION: Moderately dilated small bowel loops throughout the central abdomen and mild gastric distension, suspicious for distal small bowel obstruction. No evidence of pneumatosis or pneumoperitoneum. CT abdomen/pelvis with oral and IV contrast may be obtained for further evaluation as clinically  warranted. Electronically Signed   By: Ilona Sorrel M.D.   On: 04/21/2021 07:36      Assessment/Plan N/V/abdominal distention The patient has begun to have N/V with abdominal distention over the last 1-2 days.  He has a history of prior abdominal surgery and could have an SBO secondary to adhesive disease; however, he could have an ileus secondary to immobility and other medical issues.  He has some stool and air noted in his colon on his plain film, but to better assess, he will obtain a CT scan to further evaluation.  Cont NGT for now.  If he has evidence of a SBO, we will perform the SBO protocol.  Further recommendations can be made after his CT scan returns.  Discussed with RN regarding contrast.  Recommend holding plavix in the setting he were to need a surgical procedure.  Would also recommend transition his medications to IV as if he has a SBO, he is not going to absorb his medications orally .  We will follow.   FEN - NPO/NGT/IVFs VTE - hold plavix, on heparin ID - none  Acute on chronic CKD CHF DM CVAs Prostate cancer HTN Urinary incontinence  Claiborne Billings  Darol Destine, [facility] 04/22/2021, 2:56 PM Please see Amion for pager number during day hours 7:00am-4:30pm or 7:00am -11:30am on weekends

## 2021-04-22 NOTE — Progress Notes (Signed)
Occupational Therapy Treatment Patient Details Name: Thomas Duarte MRN: [id number] DOB: 1941-04-19 Today's Date: 04/22/2021    History of present illness Pt is an 80 y.o. male admitted 04/10/21 with c/o worsening BLE weakness, urinary incontinence; of note, pt recently started prostate CA treatment (completed 4 wks of radiation 2 wks prior). Workup for AKI on CKD 3. Lumbar MRI showed degenerative disease. 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   OT comments  Patient supine in bed and eager to get OOB.  Noted increased chest congestion and coughing this am.  Pt transitioned to EOB with min assist, once sitting EOB pt with increased coughing and noted NG tube position change.  RN notified and in room to assess.  Assisted pt back to supine with min assist.  Limited session.  Will follow and see as able.    Follow Up Recommendations  SNF;Supervision/Assistance - 24 hour    Equipment Recommendations  Other (comment) (TBD)    Recommendations for Other Services PT consult    Precautions / Restrictions Precautions Precautions: Fall Precaution Comments: NG tube       Mobility Bed Mobility Overal bed mobility: Needs Assistance Bed Mobility: Supine to Sit;Sit to Supine     Supine to sit: Min assist;HOB elevated Sit to supine: Min assist   General bed mobility comments: min assist to ascend trunk, and min assist to bring LE fully back to supine    Transfers                 General transfer comment: deferred    Balance Overall balance assessment: Needs assistance Sitting-balance support: No upper extremity supported;Feet supported Sitting balance-Leahy Scale: Good                                     ADL either performed or assessed with clinical judgement   ADL Overall ADL's : Needs assistance/impaired                                      Functional mobility during ADLs: Minimal assistance General ADL Comments: EOB only, limited due to NG tube becoming disloged after coughing at EOB     Vision       Perception     Praxis      Cognition Arousal/Alertness: Awake/alert Behavior During Therapy: WFL for tasks assessed/performed;Flat affect Overall Cognitive Status: No family/caregiver present to determine baseline cognitive functioning Area of Impairment: Following commands;Orientation;Awareness;Problem solving                 Orientation Level: Disoriented to;Time     Following Commands: Follows one step commands consistently;Follows one step commands with increased time   Awareness: Emergent Problem Solving: Slow processing;Requires verbal cues;Difficulty sequencing General Comments: pt reoriented to month (reports august), follows 1 step commands with increased time and cueing for problem sovling and slow processing        Exercises     Shoulder Instructions       General Comments VSS on RA    Pertinent Vitals/ Pain       Pain Assessment: Faces Faces Pain Scale: No hurt  Home Living  Prior Functioning/Environment              Frequency  Min 2X/week        Progress Toward Goals  OT Goals(current goals can now be found in the care plan section)  Progress towards OT goals: Progressing toward goals  Acute Rehab OT Goals Patient Stated Goal: to improve strength and return to independence OT Goal Formulation: With patient  Plan Discharge plan remains appropriate;Frequency remains appropriate    Co-evaluation                 AM-PAC OT "6 Clicks" Daily Activity     Outcome Measure   Help from another person eating meals?: Total Help from another person taking care of personal grooming?: A Little Help from another person toileting, which includes using toliet, bedpan, or urinal?: A  Lot Help from another person bathing (including washing, rinsing, drying)?: A Lot Help from another person to put on and taking off regular upper body clothing?: A Little Help from another person to put on and taking off regular lower body clothing?: A Lot 6 Click Score: 13    End of Session    OT Visit Diagnosis: Other abnormalities of gait and mobility (R26.89);Muscle weakness (generalized) (M62.81)   Activity Tolerance Treatment limited secondary to medical complications (Comment) (NG tube position)   Patient Left in bed;with call bell/phone within reach;with nursing/sitter in room   Nurse Communication Mobility status        Time: 2449-7530 OT Time Calculation (min): 21 min  Charges: OT General Charges $OT Visit: 1 Visit OT Treatments $Self Care/Home Management : 8-22 mins  Jolaine Artist, OT Acute Rehabilitation Services Pager [phone] Office [place] 04/22/2021, 9:50 AM

## 2021-04-22 NOTE — Progress Notes (Signed)
Physical Therapy Treatment Patient Details Name: Thomas Duarte MRN: [id number] DOB: Mar 25, 1941 Today's Date: 04/22/2021    History of Present Illness Pt is an 80 y.o. male admitted 04/10/21 with c/o worsening BLE weakness, urinary incontinence; of note, pt recently started prostate CA treatment (completed 4 wks of radiation 2 wks prior). Workup for AKI on CKD 3. Lumbar MRI showed degenerative disease. 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    PT Comments    Pt had trouble in mobility with his NG tube and RN requested it remain connected to wall suction for therapy. Pt able to get up to EoB with min A and pivot to chair with min A. Pt reports minor nausea and deferred further exercise. D/c plan remains appropriate at this time. PT will continue to follow acutely.      Follow Up Recommendations  SNF;Supervision for mobility/OOB     Equipment Recommendations  None recommended by PT       Precautions / Restrictions Precautions Precautions: Fall Precaution Comments: NG tube Restrictions Weight Bearing Restrictions: No    Mobility  Bed Mobility Overal bed mobility: Needs Assistance Bed Mobility: Supine to Sit     Supine to sit: Min assist;HOB elevated     General bed mobility comments: min assist to bring trunk to upright    Transfers Overall transfer level: Needs assistance   Transfers: Stand Pivot Transfers Sit to Stand: Min assist         General transfer comment: min A for steadying with standing and pivoting to recliner on RR  Ambulation/Gait             General Gait Details: deferred due to NG tube to wall suction       Balance Overall balance assessment: Needs assistance Sitting-balance support: No upper extremity supported;Feet supported Sitting balance-Leahy Scale: Good                                       Cognition Arousal/Alertness: Awake/alert Behavior During Therapy: WFL for tasks assessed/performed Overall Cognitive Status: No family/caregiver present to determine baseline cognitive functioning Area of Impairment: Awareness;Problem solving;Safety/judgement                       Following Commands: Follows one step commands consistently;Follows one step commands with increased time Safety/Judgement: Decreased awareness of safety;Decreased awareness of deficits Awareness: Emergent Problem Solving: Slow processing;Requires verbal cues;Difficulty sequencing General Comments: requires increased time, decreased awareness to deficits, safety. No concern of soiled bed.         General Comments General comments (skin integrity, edema, etc.): VSS on RA      Pertinent Vitals/Pain Pain Assessment: Faces Faces Pain Scale: No hurt     PT Goals (current goals can now be found in the care plan section) Acute Rehab PT Goals Patient Stated Goal: to improve strength and return to independence PT Goal Formulation: With patient Time For Goal Achievement: 04/26/21 Potential to Achieve Goals: Good Progress towards PT goals: Not progressing toward goals - comment (limited by NGtube)    Frequency    Min 3X/week      PT Plan Current plan remains appropriate       AM-PAC PT "6 Clicks" Mobility   Outcome Measure  Help needed turning from your  back to your side while in a flat bed without using bedrails?: None Help needed moving from lying on your back to sitting on the side of a flat bed without using bedrails?: A Little Help needed moving to and from a bed to a chair (including a wheelchair)?: A Little Help needed standing up from a chair using your arms (e.g., wheelchair or bedside chair)?: A Little Help needed to walk in hospital room?: A Little Help needed climbing 3-5 steps with a railing? : A Lot 6 Click Score: 18    End of Session Equipment Utilized During  Treatment: Gait belt Activity Tolerance: Patient tolerated treatment well Patient left: with call bell/phone within reach;in chair;with chair alarm set Nurse Communication: Mobility status PT Visit Diagnosis: Other abnormalities of gait and mobility (R26.89);Muscle weakness (generalized) (M62.81)     Time: 8657-8469 PT Time Calculation (min) (ACUTE ONLY): 20 min  Charges:  $Therapeutic Activity: 8-22 mins                     Makaylynn Bonillas B. Migdalia Dk PT, DPT Acute Rehabilitation Services Pager [phone] Office [phone]    [place] 04/22/2021, 1:52 PM

## 2021-04-23 ENCOUNTER — Inpatient Hospital Stay (HOSPITAL_COMMUNITY): Payer: Medicare HMO

## 2021-04-23 DIAGNOSIS — K56609 Unspecified intestinal obstruction, unspecified as to partial versus complete obstruction: Secondary | ICD-10-CM

## 2021-04-23 LAB — COMPREHENSIVE METABOLIC PANEL
ALT: 11 U/L (ref 0–44)
AST: 13 U/L — ABNORMAL LOW (ref 15–41)
Albumin: 2.4 g/dL — ABNORMAL LOW (ref 3.5–5.0)
Alkaline Phosphatase: 69 U/L (ref 38–126)
Anion gap: 6 (ref 5–15)
BUN: 37 mg/dL — ABNORMAL HIGH (ref 8–23)
CO2: 26 mmol/L (ref 22–32)
Calcium: 8.6 mg/dL — ABNORMAL LOW (ref 8.9–10.3)
Chloride: 106 mmol/L (ref 98–111)
Creatinine, Ser: 2.4 mg/dL — ABNORMAL HIGH (ref 0.61–1.24)
GFR, Estimated: 26 mL/min — ABNORMAL LOW (ref >60)
Glucose, Bld: 124 mg/dL — ABNORMAL HIGH (ref 70–99)
Potassium: 3.7 mmol/L (ref 3.5–5.1)
Sodium: 138 mmol/L (ref 135–145)
Total Bilirubin: 0.5 mg/dL (ref 0.3–1.2)
Total Protein: 5.2 g/dL — ABNORMAL LOW (ref 6.5–8.1)

## 2021-04-23 LAB — GLUCOSE, CAPILLARY
Glucose-Capillary: 110 mg/dL — ABNORMAL HIGH (ref 70–99)
Glucose-Capillary: 105 mg/dL — ABNORMAL HIGH (ref 70–99)
Glucose-Capillary: 100 mg/dL — ABNORMAL HIGH (ref 70–99)
Glucose-Capillary: 109 mg/dL — ABNORMAL HIGH (ref 70–99)

## 2021-04-23 LAB — PHOSPHORUS: Phosphorus: 3.5 mg/dL (ref 2.5–4.6)

## 2021-04-23 LAB — CBC
HCT: 23.8 % — ABNORMAL LOW (ref 39.0–52.0)
Hemoglobin: 7.9 g/dL — ABNORMAL LOW (ref 13.0–17.0)
MCH: 32.2 pg (ref 26.0–34.0)
MCHC: 33.2 g/dL (ref 30.0–36.0)
MCV: 97.1 fL (ref 80.0–100.0)
Platelets: 167 10*3/uL (ref 150–400)
RBC: 2.45 MIL/uL — ABNORMAL LOW (ref 4.22–5.81)
RDW: 13.5 % (ref 11.5–15.5)
WBC: 5.8 10*3/uL (ref 4.0–10.5)
nRBC: 0 % (ref 0.0–0.2)

## 2021-04-23 LAB — MAGNESIUM: Magnesium: 2 mg/dL (ref 1.7–2.4)

## 2021-04-23 MED ORDER — ACETAMINOPHEN 650 MG RE SUPP: 650.0000 mg | RECTAL | Status: DC | PRN

## 2021-04-23 MED ORDER — METOPROLOL TARTRATE 5 MG/5ML IV SOLN
5.0000 mg | Freq: Once | INTRAVENOUS | Status: AC
  Administered 2021-04-23: 5 mg via INTRAVENOUS
  Filled 2021-04-23: qty 5

## 2021-04-23 MED ORDER — DIATRIZOATE MEGLUMINE & SODIUM 66-10 % PO SOLN
90.0000 mL | Freq: Once | ORAL | Status: AC
  Administered 2021-04-24: 90 mL via NASOGASTRIC
  Filled 2021-04-23 (×2): qty 90

## 2021-04-23 NOTE — Progress Notes (Deleted)
Blood found in foley. Flushed the cathter and bag foley bag replaced. Attending made aware of finding. Will continue to monitor.

## 2021-04-23 NOTE — Consult Note (Signed)
   [facility] Inpatient Consult   04/23/2021  [name] 1940-10-28 [id number]  [facility] [[facility]] Patient: Thomas Duarte Medicare  Patient screened for length of stay hospitalization was discussed in morning progression regarding recommendations for a skilled nursing facility level of care for post hospital transition.   Plan:  Continue to follow progress and disposition to assess for post hospital care management needs.    For questions contact:   Natividad Brood, RN BSN [facility] Liaison  [phone] business mobile phone Toll free office [phone]  Fax number: [phone] [email or web address]

## 2021-04-23 NOTE — Progress Notes (Addendum)
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  At this time, patient is awaiting for skilled nursing facility placement but has been denied. On 04/21/2021, patient started having multiple episodes of nausea, vomiting.  X-ray of the abdomen showing some ileus-like features.  General surgery was consulted.  Assessment/Plan:  Nausea, vomiting likely secondary to a partial small bowel obstruction  Abdominal x-ray showed some features of ileus.  No fluid levels.  Continue n.p.o., IV fluids, continue NG tube with intermittent suction.  General surgery was consulted and CT scan of the abdomen shows some small bowel dilatation with possible transition point.  Continue to replenish electrolytes, IV fluids.   History of exploratory laparotomy in the past.  Did have some gas.  Denies overt pain.  We will follow surgical recommendation.  CKD stage IIIb.  Continue to hold Lasix.  Continue to monitor BMP.  Generalized weakness with ambulatory dysfunction.  Physical therapy has recommended skilled nursing facility placement  MRI showed multilevel lumbar disc disease. Patient will follow-up with neurosurgery as outpatient.  Awaiting for skilled nursing facility placement  Urinary retention/ incontinence History of prostate cancer s/p radiation.  Continue supportive care.  Elevated troponin likely secondary to ischemia demand ischemia and AKI.  No acute issues.  Congestive heart failure:  Lasix on hold.  Has been resumed on IV fluids due to n.p.o. status.  Positive for 338 mL.  If continues to be positive then might need Lasix.  Hold  sodium bicarbonate tablet.  History of prostate cancer.  Status post radiation treatment.  Follows up with urology as outpatient.   Bradycardia.  On metoprolol.  EKG showing PVCs and bigeminy pattern.  We will continue to monitor.   History of stroke.  On aspirin and Plavix.  Currently NPO.  Hold due to n.p.o. status   Code Status: Full  Family Communication:  Spoke with the spouse on 04/23/2021 and updated her about the clinical condition of the patient.  Disposition Plan:   Status is: Inpatient   Dispo: The patient is from: Home              Anticipated d/c is to: Skilled nursing facility.              Anticipated d/c date is: Unknown at this time, .              Patient currently not medically stable for discharge  Consultants: General surgery  Procedures: NG tube placement  Antimicrobials: None  DVT prophylaxis: Heparin subcu  Subjective  Patient was seen and examined at bedside.  Patient denies any nausea vomiting or overt abdominal pain had passed some gas.  He continues to have greenish NG output.  Denies any shortness of breath, fever, chills or rigor.  Objective: Vitals:   04/22/21 2035 04/23/21 0100 04/23/21 0500 04/23/21 1020  BP: (!) 163/65  (!) 178/51 (!) 159/71  Pulse: 90  68 81  Resp: 20   20  Temp: 98 F (36.7 C)  97.9 F (36.6 C) 98.3 F (36.8 C)  TempSrc: Oral  Oral Oral  SpO2: 96%     Weight:  96 kg    Height:  Intake/Output Summary (Last 24 hours) at 04/23/2021 1032 Last data filed at 04/23/2021 0908 Gross per 24 hour  Intake 1960.73 ml  Output 1378 ml  Net 582.73 ml    Filed Weights   04/21/21 0141 04/22/21 0604 04/23/21 0100  Weight: 95.6 kg 96.5 kg 96 kg   Body mass index is 27.92 kg/m.  Physical exam:  General:  Average built, not in obvious distress, NG-tube in place, elderly, appears weak HENT:   No scleral pallor or icterus noted. Oral mucosa is mildly dry. Chest:  Clear breath sounds.  Diminished breath sounds  bilaterally. No crackles or wheezes.  CVS: S1 &S2 heard. No murmur.  Regular rate and rhythm. Abdomen: Soft, nontender, nondistended.  Bowel sounds are heard.   Extremities: No cyanosis, clubbing or edema.  Peripheral pulses are palpable. Psych: Alert, awake and oriented, normal mood CNS:  No cranial nerve deficits.  Power equal in all extremities.   Skin: Warm and dry.  No rashes noted.  Data Reviewed:  CBC: Recent Labs  Lab 04/22/21 0341 04/23/21 0154  WBC 7.1 5.8  HGB 8.2* 7.9*  HCT 25.3* 23.8*  MCV 96.6 97.1  PLT 181 287    Basic Metabolic Panel: Recent Labs  Lab 04/19/21 2058 04/19/21 2200 04/22/21 0341 04/23/21 0154  NA 134*  --  141 138  K 3.9  --  3.9 3.7  CL 109  --  108 106  CO2 19*  --  26 26  GLUCOSE 208*  --  119* 124*  BUN 37*  --  40* 37*  CREATININE 2.26*  --  2.54* 2.40*  CALCIUM 8.7*  --  8.8* 8.6*  MG  --  2.0 2.1 2.0  PHOS  --   --  4.1 3.5    GFR: Estimated Creatinine Clearance: 29.5 mL/min (A) (by C-G formula based on SCr of 2.4 mg/dL (H)). Liver Function Tests: Recent Labs  Lab 04/23/21 0154  AST 13*  ALT 11  ALKPHOS 69  BILITOT 0.5  PROT 5.2*  ALBUMIN 2.4*   No results for input(s): LIPASE, AMYLASE in the last 168 hours. No results for input(s): AMMONIA in the last 168 hours. Coagulation Profile: No results for input(s): INR, PROTIME in the last 168 hours. Cardiac Enzymes: No results for input(s): CKTOTAL, CKMB, CKMBINDEX, TROPONINI in the last 168 hours. BNP (last 3 results) No results for input(s): PROBNP in the last 8760 hours. HbA1C: No results for input(s): HGBA1C in the last 72 hours. CBG: Recent Labs  Lab 04/22/21 0600 04/22/21 1122 04/22/21 1645 04/22/21 2052 04/23/21 0619  GLUCAP 112* 124* 103* 116* 110*    Lipid Profile: No results for input(s): CHOL, HDL, LDLCALC, TRIG, CHOLHDL, LDLDIRECT in the last 72 hours. Thyroid Function Tests: No results for input(s): TSH, T4TOTAL, FREET4, T3FREE, THYROIDAB in the  last 72 hours. Anemia Panel: No results for input(s): VITAMINB12, FOLATE, FERRITIN, TIBC, IRON, RETICCTPCT in the last 72 hours. Urine analysis:    Component Value Date/Time   COLORURINE YELLOW 04/11/2021 0814   APPEARANCEUR HAZY (A) 04/11/2021 0814   APPEARANCEUR Clear 01/01/2021 1448   LABSPEC 1.010 04/11/2021 0814   PHURINE 6.0 04/11/2021 [place] 04/11/2021 0814   HGBUR NEGATIVE 04/11/2021 0814   BILIRUBINUR NEGATIVE 04/11/2021 0814   BILIRUBINUR Negative 01/01/2021 [place] 04/11/2021 0814   PROTEINUR NEGATIVE 04/11/2021 0814   NITRITE NEGATIVE 04/11/2021 0814   LEUKOCYTESUR NEGATIVE 04/11/2021 0814   Sepsis Labs: @LABRCNTIP (procalcitonin:4,lacticidven:4)  ) No results found for  this or any previous visit (from the past 240 hour(s)).     Studies: CT ABDOMEN PELVIS WO CONTRAST  Result Date: 04/22/2021 CLINICAL DATA:  Nausea and vomiting.  History of prostate cancer. EXAM: CT ABDOMEN AND PELVIS WITHOUT CONTRAST TECHNIQUE: Multidetector CT imaging of the abdomen and pelvis was performed following the standard protocol without IV contrast. COMPARISON:  CT abdomen and pelvis 10/02/2020. FINDINGS: Lower chest: There are small bilateral pleural effusions. Hepatobiliary: No focal liver abnormality is seen. No gallstones, gallbladder wall thickening, or biliary dilatation. Pancreas: Unremarkable. No pancreatic ductal dilatation or surrounding inflammatory changes. Spleen: Normal in size without focal abnormality. Adrenals/Urinary Tract: Adrenal glands are unremarkable. There is no hydronephrosis or renal calculus. There is a stable rounded hypodensity in the medial left kidney measuring 14 mm, likely a cyst. Bladder is unremarkable. Stomach/Bowel: There are dilated small bowel loops in the mid abdomen with air-fluid levels measuring up to 4.7 cm. Oral contrast is seen proximal to this level. A transition point is likely present seen on coronal image 6/22 in  the mid small bowel. Distal small bowel is decompressed. The colon and stomach are not dilated. The appendix is not visualized. No focal inflammatory changes are seen. Nasogastric tube tip is in the mid stomach. There is no free intraperitoneal air or pneumatosis identified. Vascular/Lymphatic: Aortic atherosclerosis. No enlarged abdominal or pelvic lymph nodes. Reproductive: Prostate radiotherapy seeds are present, new from the prior examination. Other: No abdominal wall hernia or abnormality. No abdominopelvic ascites. There is some scarring in the anterior abdominal wall. Musculoskeletal: Degenerative changes affect the spine. No suspicious osseous lesions are identified. IMPRESSION: 1. Findings compatible with mid small-bowel obstruction. Transition point is seen in the mid abdomen. No free air or pneumatosis. 2. Nasogastric tube tip in the mid stomach. 3. Prostate radiotherapy seeds. 4. Small bilateral pleural effusions. Electronically Signed   By: Ronney Asters M.D.   On: 04/22/2021 20:08    Scheduled Meds:  amLODipine  10 mg Oral Daily   aspirin  325 mg Oral Daily   clopidogrel  75 mg Oral QHS   diatrizoate meglumine-sodium  90 mL Per NG tube Once   heparin  5,000 Units Subcutaneous Q12H   insulin aspart  0-9 Units Subcutaneous TID WC   metoprolol succinate  25 mg Oral Daily   sodium bicarbonate  650 mg Oral TID   tamsulosin  0.8 mg Oral QHS    Continuous Infusions:  sodium chloride 75 mL/hr at 04/23/21 0512   promethazine (PHENERGAN) injection (IM or IVPB) Stopped (04/21/21 1930)     LOS: 13 days    Flora Lipps, MD [facility] 04/23/2021, 10:32 AM

## 2021-04-23 NOTE — Progress Notes (Signed)
Physical Therapy Treatment Patient Details Name: Thomas Duarte MRN: [id number] DOB: September 19, 1940 Today's Date: 04/23/2021    History of Present Illness Pt is an 80 y.o. male admitted 04/10/21 with c/o worsening BLE weakness, urinary incontinence; of note, pt recently started prostate CA treatment (completed 4 wks of radiation 2 wks prior). Workup for AKI on CKD 3. Lumbar MRI showed degenerative disease. 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    PT Comments    Pt agreeable to getting up to chair, however mobility continues to be limited by wall suction on NG-tube. Pt also limited by decreased strength and balance. Pt is currently min guard for bed mobility, min A for power up with standing and min guard for stepping to chair. D/c plans remain appropriate at this time. PT will continue to follow acutely.    Follow Up Recommendations  SNF;Supervision for mobility/OOB     Equipment Recommendations  None recommended by PT       Precautions / Restrictions Precautions Precautions: Fall Precaution Comments: NG tube Restrictions Weight Bearing Restrictions: No    Mobility  Bed Mobility Overal bed mobility: Needs Assistance Bed Mobility: Supine to Sit     Supine to sit: HOB elevated;Min guard     General bed mobility comments: min guard for safety pt able to use bedrail to pull to EoB, increased effort to right trunk but no physical assist is needed    Transfers Overall transfer level: Needs assistance     Sit to Stand: Min assist         General transfer comment: min A for steadying with standing at RW  Ambulation/Gait Ambulation/Gait assistance: Min guard Gait Distance (Feet): 3 Feet Assistive device: Rolling walker (2 wheeled) Gait Pattern/deviations: Step-to pattern;Decreased step length - right;Decreased step length - left;Shuffle;Trunk  flexed Gait velocity: Decreased Gait velocity interpretation: <1.31 ft/sec, indicative of household ambulator General Gait Details: min guard for stepping from bed to recliner         Balance Overall balance assessment: Needs assistance Sitting-balance support: No upper extremity supported;Feet supported Sitting balance-Leahy Scale: Good     Standing balance support: Bilateral upper extremity supported;Single extremity supported;During functional activity Standing balance-Leahy Scale: Fair Standing balance comment: Reliant on UE support                            Cognition Arousal/Alertness: Awake/alert Behavior During Therapy: WFL for tasks assessed/performed Overall Cognitive Status: No family/caregiver present to determine baseline cognitive functioning Area of Impairment: Awareness;Problem solving;Safety/judgement                       Following Commands: Follows one step commands consistently;Follows one step commands with increased time Safety/Judgement: Decreased awareness of safety;Decreased awareness of deficits Awareness: Emergent Problem Solving: Slow processing;Requires verbal cues;Difficulty sequencing General Comments: requires increased time for command follow      Exercises General Exercises - Lower Extremity Long Arc Quad: AROM;20 reps;Seated Hip Flexion/Marching: AROM;Seated;20 reps    General Comments General comments (skin integrity, edema, etc.): VSS on RA      Pertinent Vitals/Pain Pain Assessment: No/denies pain Faces Pain Scale: No hurt     PT Goals (current goals can now be found in the care plan section) Acute Rehab PT Goals Patient Stated Goal: to improve strength and return to independence PT Goal  Formulation: With patient Time For Goal Achievement: 04/26/21 Potential to Achieve Goals: Good Progress towards PT goals: Not progressing toward goals - comment (limited by NG tube)    Frequency    Min  3X/week      PT Plan Current plan remains appropriate       AM-PAC PT "6 Clicks" Mobility   Outcome Measure  Help needed turning from your back to your side while in a flat bed without using bedrails?: None Help needed moving from lying on your back to sitting on the side of a flat bed without using bedrails?: A Little Help needed moving to and from a bed to a chair (including a wheelchair)?: A Little Help needed standing up from a chair using your arms (e.g., wheelchair or bedside chair)?: A Little Help needed to walk in hospital room?: A Little Help needed climbing 3-5 steps with a railing? : A Lot 6 Click Score: 18    End of Session Equipment Utilized During Treatment: Gait belt Activity Tolerance: Patient tolerated treatment well Patient left: with call bell/phone within reach;in chair;with chair alarm set Nurse Communication: Mobility status PT Visit Diagnosis: Other abnormalities of gait and mobility (R26.89);Muscle weakness (generalized) (M62.81)     Time: 2548-6282 PT Time Calculation (min) (ACUTE ONLY): 30 min  Charges:  $Therapeutic Exercise: 8-22 mins $Therapeutic Activity: 8-22 mins                     Ertha Nabor B. Migdalia Dk PT, DPT Acute Rehabilitation Services Pager [phone] Office [phone]    [place] 04/23/2021, 11:59 AM

## 2021-04-23 NOTE — Progress Notes (Signed)
Progress Note     Subjective: Feeling okay this morning. No nausea or emesis with NGT. Passing flatus but no BM.  No abdominal pain  Objective: Vital signs in last 24 hours: Temp:  [97.8 F (36.6 C)-98 F (36.7 C)] 97.9 F (36.6 C) (09/07 0500) Pulse Rate:  [68-90] 68 (09/07 0500) Resp:  [19-20] 20 (09/06 2035) BP: (151-178)/(51-65) 178/51 (09/07 0500) SpO2:  [94 %-96 %] 96 % (09/06 2035) Weight:  [96 kg] 96 kg (09/07 0100) Last BM Date: 04/20/21  Intake/Output from previous day: 09/06 0701 - 09/07 0700 In: 1960.7 [I.V.:1960.7] Out: 1228 [Urine:528; Emesis/NG output:700] Intake/Output this shift: No intake/output data recorded.  PE: General: pleasant, WD, male who is laying in bed in NAD HEENT: head is normocephalic, atraumatic. Mouth is pink and moist Heart:  Palpable radial and pedal pulses bilaterally Lungs:  Respiratory effort nonlabored Abd: soft, NT, ND, +BS, well healed midline abdominal scar MSK: all 4 extremities are symmetrical with no cyanosis, clubbing, or edema. Skin: warm and dry with no masses, lesions, or rashes Psych: A&Ox3 with an appropriate affect.    Lab Results:  Recent Labs    04/22/21 0341 04/23/21 0154  WBC 7.1 5.8  HGB 8.2* 7.9*  HCT 25.3* 23.8*  PLT 181 167   BMET Recent Labs    04/22/21 0341 04/23/21 0154  NA 141 138  K 3.9 3.7  CL 108 106  CO2 26 26  GLUCOSE 119* 124*  BUN 40* 37*  CREATININE 2.54* 2.40*  CALCIUM 8.8* 8.6*   PT/INR No results for input(s): LABPROT, INR in the last 72 hours. CMP     Component Value Date/Time   NA 138 04/23/2021 0154   NA 141 09/09/2020 1617   K 3.7 04/23/2021 0154   CL 106 04/23/2021 0154   CO2 26 04/23/2021 0154   GLUCOSE 124 (H) 04/23/2021 0154   BUN 37 (H) 04/23/2021 0154   BUN 39 (H) 09/09/2020 1617   CREATININE 2.40 (H) 04/23/2021 0154   CALCIUM 8.6 (L) 04/23/2021 0154   PROT 5.2 (L) 04/23/2021 0154   ALBUMIN 2.4 (L) 04/23/2021 0154   AST 13 (L) 04/23/2021 0154   ALT  11 04/23/2021 0154   ALKPHOS 69 04/23/2021 0154   BILITOT 0.5 04/23/2021 0154   GFRNONAA 26 (L) 04/23/2021 0154   GFRAA 40 (L) 09/09/2020 1617   Lipase     Component Value Date/Time   LIPASE 42 04/10/2021 1438       Studies/Results: CT ABDOMEN PELVIS WO CONTRAST  Result Date: 04/22/2021 CLINICAL DATA:  Nausea and vomiting.  History of prostate cancer. EXAM: CT ABDOMEN AND PELVIS WITHOUT CONTRAST TECHNIQUE: Multidetector CT imaging of the abdomen and pelvis was performed following the standard protocol without IV contrast. COMPARISON:  CT abdomen and pelvis 10/02/2020. FINDINGS: Lower chest: There are small bilateral pleural effusions. Hepatobiliary: No focal liver abnormality is seen. No gallstones, gallbladder wall thickening, or biliary dilatation. Pancreas: Unremarkable. No pancreatic ductal dilatation or surrounding inflammatory changes. Spleen: Normal in size without focal abnormality. Adrenals/Urinary Tract: Adrenal glands are unremarkable. There is no hydronephrosis or renal calculus. There is a stable rounded hypodensity in the medial left kidney measuring 14 mm, likely a cyst. Bladder is unremarkable. Stomach/Bowel: There are dilated small bowel loops in the mid abdomen with air-fluid levels measuring up to 4.7 cm. Oral contrast is seen proximal to this level. A transition point is likely present seen on coronal image 6/22 in the mid small bowel. Distal small bowel is  decompressed. The colon and stomach are not dilated. The appendix is not visualized. No focal inflammatory changes are seen. Nasogastric tube tip is in the mid stomach. There is no free intraperitoneal air or pneumatosis identified. Vascular/Lymphatic: Aortic atherosclerosis. No enlarged abdominal or pelvic lymph nodes. Reproductive: Prostate radiotherapy seeds are present, new from the prior examination. Other: No abdominal wall hernia or abnormality. No abdominopelvic ascites. There is some scarring in the anterior abdominal  wall. Musculoskeletal: Degenerative changes affect the spine. No suspicious osseous lesions are identified. IMPRESSION: 1. Findings compatible with mid small-bowel obstruction. Transition point is seen in the mid abdomen. No free air or pneumatosis. 2. Nasogastric tube tip in the mid stomach. 3. Prostate radiotherapy seeds. 4. Small bilateral pleural effusions. Electronically Signed   By: Ronney Asters M.D.   On: 04/22/2021 20:08   DG Abd 1 View  Result Date: 04/22/2021 CLINICAL DATA:  NG tube placement in a 80 year old male with suspected ileus or partial small bowel obstruction. EXAM: ABDOMEN - 1 VIEW COMPARISON:  April 22, 2021. FINDINGS: Nasogastric tube in the distal stomach, tip in the antro pyloric region. Side port below the GE junction. Persistent small bowel distension. Scattered colonic loops with gas. Pelvic bowel loops not imaged. IMPRESSION: Nasogastric tube in the distal stomach, tip in the antro pyloric region decreased gastric distension since previous imaging. Persistent small bowel distension. Ileus versus partial small bowel obstruction, could consider CT for further evaluation as warranted. Electronically Signed   By: Zetta Bills M.D.   On: 04/22/2021 09:57   DG Abd 1 View  Result Date: 04/22/2021 CLINICAL DATA:  Ileus. EXAM: ABDOMEN - 1 VIEW COMPARISON:  April 21, 2021. FINDINGS: Stable small bowel dilatation is noted most consistent with postoperative ileus. No colonic dilatation is noted. Distal tip of nasogastric tube is seen in expected position of distal stomach. IMPRESSION: Stable small bowel dilatation is noted most consistent postoperative ileus. Electronically Signed   By: Marijo Conception M.D.   On: 04/22/2021 08:21   DG Abd 1 View  Result Date: 04/21/2021 CLINICAL DATA:  Encounter for NG tube placement EXAM: ABDOMEN - 1 VIEW COMPARISON:  One view abdomen 04/21/2021 at 1:48 p.m. FINDINGS: NG tube was advanced and the side port is now in the stomach. Gaseous distension  of small bowel noted. No free air. Heart size is normal. Lung bases are clear. IMPRESSION: 1. NG tube advanced and the side port is now in the stomach. 2. Gaseous distension of small bowel. Electronically Signed   By: San Morelle M.D.   On: 04/21/2021 14:52   DG Abd Portable 1V  Result Date: 04/21/2021 CLINICAL DATA:  Nasogastric tube placement, vomiting EXAM: PORTABLE ABDOMEN - 1 VIEW COMPARISON:  Portable exam 1348 hours compared to 0723 hours FINDINGS: Tip of nasogastric tube projects over distal esophagus; recommend advancing tube 12 cm to place proximal side-port within the stomach. Dilated bowel loops in upper abdomen. Gaseous distention of stomach. Atherosclerotic calcification aorta. IMPRESSION: Recommend advancing nasogastric tube 12 cm to place proximal side-port within stomach. Aortic Atherosclerosis (ICD10-I70.0). Electronically Signed   By: Lavonia Dana M.D.   On: 04/21/2021 14:08    Anti-infectives: Anti-infectives (From admission, onward)    None        Assessment/Plan SBO CT scan with findings compatible with mid small-bowel obstruction.  Transition point is seen in the mid abdomen. No free air or pneumatosis. - h/o of ex lap  - will start SBO protocol and monitor bowel function and abdominal films -  keep K>4 and Mg > 2 for bowel function  FEN - NPO/NGT/IVFs VTE - hold plavix, on heparin subq ID - none   Acute on chronic CKD CHF DM CVAs Prostate cancer HTN Urinary incontinence    LOS: 13 days    Winferd Humphrey, [facility] 04/23/2021, 9:06 AM Please see Amion for pager number during day hours 7:00am-4:30pm

## 2021-04-24 ENCOUNTER — Inpatient Hospital Stay (HOSPITAL_COMMUNITY): Payer: Medicare HMO

## 2021-04-24 LAB — BASIC METABOLIC PANEL
Anion gap: 9 (ref 5–15)
BUN: 38 mg/dL — ABNORMAL HIGH (ref 8–23)
CO2: 27 mmol/L (ref 22–32)
Calcium: 8.8 mg/dL — ABNORMAL LOW (ref 8.9–10.3)
Chloride: 107 mmol/L (ref 98–111)
Creatinine, Ser: 2.14 mg/dL — ABNORMAL HIGH (ref 0.61–1.24)
GFR, Estimated: 30 mL/min — ABNORMAL LOW (ref >60)
Glucose, Bld: 113 mg/dL — ABNORMAL HIGH (ref 70–99)
Potassium: 3.6 mmol/L (ref 3.5–5.1)
Sodium: 143 mmol/L (ref 135–145)

## 2021-04-24 LAB — MAGNESIUM: Magnesium: 2.2 mg/dL (ref 1.7–2.4)

## 2021-04-24 LAB — GLUCOSE, CAPILLARY
Glucose-Capillary: 99 mg/dL (ref 70–99)
Glucose-Capillary: 123 mg/dL — ABNORMAL HIGH (ref 70–99)
Glucose-Capillary: 144 mg/dL — ABNORMAL HIGH (ref 70–99)
Glucose-Capillary: 115 mg/dL — ABNORMAL HIGH (ref 70–99)

## 2021-04-24 LAB — CBC
HCT: 23.7 % — ABNORMAL LOW (ref 39.0–52.0)
Hemoglobin: 7.7 g/dL — ABNORMAL LOW (ref 13.0–17.0)
MCH: 31.6 pg (ref 26.0–34.0)
MCHC: 32.5 g/dL (ref 30.0–36.0)
MCV: 97.1 fL (ref 80.0–100.0)
Platelets: 177 10*3/uL (ref 150–400)
RBC: 2.44 MIL/uL — ABNORMAL LOW (ref 4.22–5.81)
RDW: 13.3 % (ref 11.5–15.5)
WBC: 5.5 10*3/uL (ref 4.0–10.5)
nRBC: 0 % (ref 0.0–0.2)

## 2021-04-24 LAB — PHOSPHORUS: Phosphorus: 3.4 mg/dL (ref 2.5–4.6)

## 2021-04-24 MED ORDER — METOPROLOL TARTRATE 5 MG/5ML IV SOLN: 5.0000 mg | INTRAVENOUS | Status: DC | PRN

## 2021-04-24 MED ORDER — METOPROLOL TARTRATE 5 MG/5ML IV SOLN
2.5000 mg | Freq: Four times a day (QID) | INTRAVENOUS | Status: DC
  Administered 2021-04-24 – 2021-04-29 (×19): 2.5 mg via INTRAVENOUS
  Filled 2021-04-24 (×18): qty 5

## 2021-04-24 MED ORDER — HYDRALAZINE HCL 20 MG/ML IJ SOLN
10.0000 mg | Freq: Four times a day (QID) | INTRAMUSCULAR | Status: DC | PRN
  Administered 2021-04-25 – 2021-04-26 (×3): 10 mg via INTRAVENOUS
  Filled 2021-04-24 (×3): qty 1

## 2021-04-24 NOTE — Plan of Care (Signed)
Patient progressing 

## 2021-04-24 NOTE — Progress Notes (Signed)
General Surgery Follow Up Note  Subjective:    Overnight Issues:   Objective:  Vital signs for last 24 hours: Temp:  [98 F (36.7 C)-98.9 F (37.2 C)] 98 F (36.7 C) (09/08 0441) Pulse Rate:  [75-81] 77 (09/08 0441) Resp:  [18-20] 18 (09/08 0441) BP: (157-176)/(55-78) 161/78 (09/08 0441) SpO2:  [91 %-98 %] 98 % (09/08 0441) Weight:  [95.6 kg] 95.6 kg (09/08 0441)  Hemodynamic parameters for last 24 hours:    Intake/Output from previous day: 09/07 0701 - 09/08 0700 In: 1562.7 [I.V.:1562.7] Out: 2550 [Urine:750; Emesis/NG output:1800]  Intake/Output this shift: Total I/O In: -  Out: 300 [Urine:300]  Vent settings for last 24 hours:    Physical Exam:  Gen: comfortable, no distress Neuro: non-focal exam HEENT: PERRL Neck: supple CV: RRR Pulm: unlabored breathing Abd: soft, NT, NG with dark bilious o/p with maybe a reddish hue GU: clear yellow urine Extr: wwp, no edema   Results for orders placed or performed during the hospital encounter of 04/10/21 (from the past 24 hour(s))  Glucose, capillary     Status: Abnormal   Collection Time: 04/23/21 11:13 AM  Result Value Ref Range   Glucose-Capillary 105 (H) 70 - 99 mg/dL  Glucose, capillary     Status: Abnormal   Collection Time: 04/23/21  4:15 PM  Result Value Ref Range   Glucose-Capillary 100 (H) 70 - 99 mg/dL  Glucose, capillary     Status: Abnormal   Collection Time: 04/23/21  8:56 PM  Result Value Ref Range   Glucose-Capillary 109 (H) 70 - 99 mg/dL   Comment 1 Notify RN    Comment 2 Document in Chart   Basic metabolic panel     Status: Abnormal   Collection Time: 04/24/21  3:30 AM  Result Value Ref Range   Sodium 143 135 - 145 mmol/L   Potassium 3.6 3.5 - 5.1 mmol/L   Chloride 107 98 - 111 mmol/L   CO2 27 22 - 32 mmol/L   Glucose, Bld 113 (H) 70 - 99 mg/dL   BUN 38 (H) 8 - 23 mg/dL   Creatinine, Ser 2.14 (H) 0.61 - 1.24 mg/dL   Calcium 8.8 (L) 8.9 - 10.3 mg/dL   GFR, Estimated 30 (L) >60 mL/min    Anion gap 9 5 - 15  CBC     Status: Abnormal   Collection Time: 04/24/21  3:30 AM  Result Value Ref Range   WBC 5.5 4.0 - 10.5 K/uL   RBC 2.44 (L) 4.22 - 5.81 MIL/uL   Hemoglobin 7.7 (L) 13.0 - 17.0 g/dL   HCT 23.7 (L) 39.0 - 52.0 %   MCV 97.1 80.0 - 100.0 fL   MCH 31.6 26.0 - 34.0 pg   MCHC 32.5 30.0 - 36.0 g/dL   RDW 13.3 11.5 - 15.5 %   Platelets 177 150 - 400 K/uL   nRBC 0.0 0.0 - 0.2 %  Magnesium     Status: None   Collection Time: 04/24/21  3:30 AM  Result Value Ref Range   Magnesium 2.2 1.7 - 2.4 mg/dL  Phosphorus     Status: None   Collection Time: 04/24/21  3:30 AM  Result Value Ref Range   Phosphorus 3.4 2.5 - 4.6 mg/dL  Glucose, capillary     Status: None   Collection Time: 04/24/21  6:13 AM  Result Value Ref Range   Glucose-Capillary 99 70 - 99 mg/dL    Assessment & Plan:  Present on Admission:  Prostate cancer ([place])  Hypertension  (Resolved) Stroke ([place])    LOS: 14 days   Additional comments:I reviewed the patient's new clinical lab test results.   and I reviewed the patients new imaging test results.    SBO CT scan with findings compatible with mid small-bowel obstruction.  Transition point is seen in the mid abdomen. No free air or pneumatosis. - h/o of ex lap  - reposition NGT and initiate SBO protocol. Monitor bowel function and abdominal films - keep K>4 and Mg > 2 for bowel function   FEN - NPO/NGT/IVFs VTE - hold plavix, on heparin subq ID - none   Acute on chronic CKD CHF DM CVAs Prostate cancer HTN Urinary incontinence    Jesusita Oka, MD Trauma & General Surgery Please use [email or web address] to contact on call provider  04/24/2021  *Care during the described time interval was provided by me. I have reviewed this patient's available data, including medical history, events of note, physical examination and test results as part of my evaluation.

## 2021-04-24 NOTE — Progress Notes (Signed)
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  At this time, patient is awaiting for skilled nursing facility placement but has been denied. On 04/21/2021, patient started having multiple episodes of nausea, vomiting.  X-ray of the abdomen showing some ileus-like features.  General surgery was consulted.  Assessment/Plan:  Nausea, vomiting likely secondary to a partial small bowel obstruction continue IV fluids n.p.o. NG tube with intermittent suction.  General surgery on board.  Awaiting for small bowel follow-through today.  CT scan shows some transition point with the small bowel dilatation.  Continue IV fluids and electrolyte replacement.   CKD stage IIIb.  Continue to hold Lasix.  Continue to monitor BMP.  Currently on IV fluids.  Generalized weakness with ambulatory dysfunction.  Physical therapy has recommended skilled nursing facility placement  MRI showed multilevel lumbar disc disease. Patient will follow-up with neurosurgery as outpatient.  Awaiting for skilled nursing facility placement  Urinary retention/ incontinence History of prostate cancer s/p radiation.  Continue supportive care.  Elevated troponin likely secondary to  demand ischemia and AKI.  No acute issues.  Congestive heart failure:   Lasix on hold.  Has been resumed on IV fluids due to n.p.o. status.  Patient is negative for 799 mL.  If continues to be positive balance, then might need Lasix.  Discontinued sodium bicarbonate tablet.  History of prostate cancer.  Status post radiation treatment.  Follows up with urology as outpatient.    Bradycardia.  On metoprolol.  EKG showing PVCs and bigeminy pattern.  We will continue to monitor.  Currently on IV metoprolol due to n.p.o. status.  History of stroke.  On aspirin and Plavix at home.  Currently NPO.  Continue to hold due to n.p.o. status   Code Status: Full  Family Communication:  Spoke with the spouse on 04/23/2021   Disposition Plan:   Status is: Inpatient   Dispo: The patient is from: Home              Anticipated d/c is to: Skilled nursing facility.              Anticipated d/c date is: Unknown at this time, .              Patient currently not medically stable for discharge  Consultants: General surgery  Procedures: NG tube placement  Antimicrobials: None  DVT prophylaxis: Heparin subcu  Subjective  Today, patient was seen and examined at bedside.  Denies any nausea vomiting or abdominal pain.  Still has significant NG output.  Has not had a bowel movement.    Objective: Vitals:   04/23/21 1110 04/23/21 1938 04/24/21 0231 04/24/21 0441  BP: (!) 165/55 (!) 176/76 (!) 157/59 (!) 161/78  Pulse: 75 81  77  Resp: 18 19  18   Temp: 98.9 F (37.2 C) 98.4 F (36.9 C)  98 F (36.7 C)  TempSrc: Oral Oral  Oral  SpO2: 96% 91%  98%  Weight:    95.6 kg  Height:        Intake/Output Summary (Last 24 hours) at 04/24/2021 1019 Last data filed at 04/24/2021 0900 Gross per 24 hour  Intake 1562.7 ml  Output 2400  ml  Net -837.3 ml    Filed Weights   04/22/21 0604 04/23/21 0100 04/24/21 0441  Weight: 96.5 kg 96 kg 95.6 kg   Body mass index is 27.81 kg/m.  Physical exam:  General:  Average built, not in obvious distress, NG-tube in place, elderly, appears weak HENT:   No scleral pallor or icterus noted. Oral mucosa is mildly dry. Chest:  Clear breath sounds.  Diminished breath sounds bilaterally. No crackles or wheezes.  CVS: S1 &S2 heard. No murmur.  Regular rate and rhythm. Abdomen: Soft, nontender, nondistended.  Bowel sounds are heard.    Extremities: No cyanosis, clubbing or edema.  Peripheral pulses are palpable. Psych: Alert, awake and oriented, normal mood CNS:  No cranial nerve deficits.  Power equal in all extremities.   Skin: Warm and dry.  No rashes noted.  Data Reviewed:  CBC: Recent Labs  Lab 04/22/21 0341 04/23/21 0154 04/24/21 0330  WBC 7.1 5.8 5.5  HGB 8.2* 7.9* 7.7*  HCT 25.3* 23.8* 23.7*  MCV 96.6 97.1 97.1  PLT 181 167 115    Basic Metabolic Panel: Recent Labs  Lab 04/19/21 2058 04/19/21 2200 04/22/21 0341 04/23/21 0154 04/24/21 0330  NA 134*  --  141 138 143  K 3.9  --  3.9 3.7 3.6  CL 109  --  108 106 107  CO2 19*  --  26 26 27   GLUCOSE 208*  --  119* 124* 113*  BUN 37*  --  40* 37* 38*  CREATININE 2.26*  --  2.54* 2.40* 2.14*  CALCIUM 8.7*  --  8.8* 8.6* 8.8*  MG  --  2.0 2.1 2.0 2.2  PHOS  --   --  4.1 3.5 3.4    GFR: Estimated Creatinine Clearance: 30.6 mL/min (A) (by C-G formula based on SCr of 2.14 mg/dL (H)). Liver Function Tests: Recent Labs  Lab 04/23/21 0154  AST 13*  ALT 11  ALKPHOS 69  BILITOT 0.5  PROT 5.2*  ALBUMIN 2.4*    No results for input(s): LIPASE, AMYLASE in the last 168 hours. No results for input(s): AMMONIA in the last 168 hours. Coagulation Profile: No results for input(s): INR, PROTIME in the last 168 hours. Cardiac Enzymes: No results for input(s): CKTOTAL, CKMB, CKMBINDEX, TROPONINI in the last 168 hours. BNP (last 3 results) No results for input(s): PROBNP in the last 8760 hours. HbA1C: No results for input(s): HGBA1C in the last 72 hours. CBG: Recent Labs  Lab 04/23/21 0619 04/23/21 1113 04/23/21 1615 04/23/21 2056 04/24/21 0613  GLUCAP 110* 105* 100* 109* 99    Lipid Profile: No results for input(s): CHOL, HDL, LDLCALC, TRIG, CHOLHDL, LDLDIRECT in the last 72 hours. Thyroid Function Tests: No results for input(s): TSH, T4TOTAL, FREET4, T3FREE, THYROIDAB in the last 72 hours. Anemia Panel: No results for input(s):  VITAMINB12, FOLATE, FERRITIN, TIBC, IRON, RETICCTPCT in the last 72 hours. Urine analysis:    Component Value Date/Time   COLORURINE YELLOW 04/11/2021 0814   APPEARANCEUR HAZY (A) 04/11/2021 0814   APPEARANCEUR Clear 01/01/2021 1448   LABSPEC 1.010 04/11/2021 0814   PHURINE 6.0 04/11/2021 [place] 04/11/2021 0814   HGBUR NEGATIVE 04/11/2021 0814   BILIRUBINUR NEGATIVE 04/11/2021 0814   BILIRUBINUR Negative 01/01/2021 [place] 04/11/2021 0814   PROTEINUR NEGATIVE 04/11/2021 0814   NITRITE NEGATIVE 04/11/2021 0814   LEUKOCYTESUR NEGATIVE 04/11/2021 0814   Sepsis Labs: @LABRCNTIP (procalcitonin:4,lacticidven:4)  ) No results found for this or any previous visit (  from the past 240 hour(s)).     Studies: DG Abd Portable 1V  Result Date: 04/23/2021 CLINICAL DATA:  NG tube placement. EXAM: PORTABLE ABDOMEN - 1 VIEW COMPARISON:  Radiograph and CT yesterday. FINDINGS: Tip of the enteric tube below the diaphragm in the stomach. The side-port is in the region of the gastroesophageal junction. There is enteric contrast within the transverse colon from yesterday's CT. IMPRESSION: Tip of the enteric tube below the diaphragm in the stomach, side-port in the region of the gastroesophageal junction. Recommend advancement of 3 5 cm for optimal placement. Electronically Signed   By: Keith Rake M.D.   On: 04/23/2021 18:30    Scheduled Meds:  diatrizoate meglumine-sodium  90 mL Per NG tube Once   heparin  5,000 Units Subcutaneous Q12H   insulin aspart  0-9 Units Subcutaneous TID WC   metoprolol tartrate  2.5 mg Intravenous Q6H   tamsulosin  0.8 mg Oral QHS    Continuous Infusions:  sodium chloride 75 mL/hr at 04/24/21 0406   promethazine (PHENERGAN) injection (IM or IVPB) Stopped (04/21/21 1930)     LOS: 14 days    Flora Lipps, MD [facility] 04/24/2021, 10:19 AM

## 2021-04-24 NOTE — Care Management Important Message (Signed)
Important Message  Patient Details  Name: Thomas Duarte MRN: [id number] Date of Birth: February 12, 1941   Medicare Important Message Given:  Yes     Shelda Altes 04/24/2021, 8:57 AM

## 2021-04-24 NOTE — Plan of Care (Signed)
  Problem: Clinical Measurements: Goal: Diagnostic test results will improve Outcome: Progressing   

## 2021-04-24 NOTE — TOC Progression Note (Addendum)
Transition of Care [facility]) - Progression Note    Patient Details  Name: Thomas Duarte MRN: [id number] Date of Birth: 05-28-1941  Transition of Care [facility]) CM/SW Contact  Reece Agar, [place] Phone Number: 04/24/2021, 3:14 PM  Clinical Narrative:    Pt not medically ready for DC, CSW will continue to follow.    CSW contacted Ebony Hail at [facility] to inform her of pt DC status. Ebony Hail informed CSW that Josem Kaufmann expires on the 12th and it will have to be restarted if pt is not DC by then. CSW will continue to follow.    Expected Discharge Plan: [place] Barriers to Discharge: Continued Medical Work up  Expected Discharge Plan and Services Expected Discharge Plan: [place] In-house Referral: Clinical Social Work   Post Acute Care Choice: [place] Living arrangements for the past 2 months: Single Family Home Expected Discharge Date: 04/16/21                                     Social Determinants of Health (SDOH) Interventions    Readmission Risk Interventions No flowsheet data found.

## 2021-04-25 ENCOUNTER — Inpatient Hospital Stay (HOSPITAL_COMMUNITY): Payer: Medicare HMO

## 2021-04-25 LAB — CBC
HCT: 23.7 % — ABNORMAL LOW (ref 39.0–52.0)
Hemoglobin: 7.7 g/dL — ABNORMAL LOW (ref 13.0–17.0)
MCH: 32.2 pg (ref 26.0–34.0)
MCHC: 32.5 g/dL (ref 30.0–36.0)
MCV: 99.2 fL (ref 80.0–100.0)
Platelets: 188 10*3/uL (ref 150–400)
RBC: 2.39 MIL/uL — ABNORMAL LOW (ref 4.22–5.81)
RDW: 13.2 % (ref 11.5–15.5)
WBC: 7.3 10*3/uL (ref 4.0–10.5)
nRBC: 0 % (ref 0.0–0.2)

## 2021-04-25 LAB — BASIC METABOLIC PANEL
Anion gap: 11 (ref 5–15)
BUN: 53 mg/dL — ABNORMAL HIGH (ref 8–23)
CO2: 25 mmol/L (ref 22–32)
Calcium: 8.6 mg/dL — ABNORMAL LOW (ref 8.9–10.3)
Chloride: 112 mmol/L — ABNORMAL HIGH (ref 98–111)
Creatinine, Ser: 2.04 mg/dL — ABNORMAL HIGH (ref 0.61–1.24)
GFR, Estimated: 32 mL/min — ABNORMAL LOW (ref >60)
Glucose, Bld: 124 mg/dL — ABNORMAL HIGH (ref 70–99)
Potassium: 3.8 mmol/L (ref 3.5–5.1)
Sodium: 148 mmol/L — ABNORMAL HIGH (ref 135–145)

## 2021-04-25 LAB — GLUCOSE, CAPILLARY
Glucose-Capillary: 129 mg/dL — ABNORMAL HIGH (ref 70–99)
Glucose-Capillary: 128 mg/dL — ABNORMAL HIGH (ref 70–99)
Glucose-Capillary: 138 mg/dL — ABNORMAL HIGH (ref 70–99)
Glucose-Capillary: 114 mg/dL — ABNORMAL HIGH (ref 70–99)

## 2021-04-25 LAB — MAGNESIUM: Magnesium: 2.3 mg/dL (ref 1.7–2.4)

## 2021-04-25 LAB — PHOSPHORUS: Phosphorus: 2.9 mg/dL (ref 2.5–4.6)

## 2021-04-25 NOTE — Progress Notes (Signed)
Subjective/Chief Complaint: Pt denies abdominal pain Reports passing flatus Still with a large amount of NG drainage   Objective: Vital signs in last 24 hours: Temp:  [97.6 F (36.4 C)-98.3 F (36.8 C)] 98.3 F (36.8 C) (09/09 0326) Pulse Rate:  [83-94] 94 (09/09 0326) Resp:  [17-20] 20 (09/09 0326) BP: (154-166)/(69-88) 166/88 (09/09 0326) SpO2:  [95 %-97 %] 97 % (09/09 0326) Last BM Date: 04/20/21  Intake/Output from previous day: 09/08 0701 - 09/09 0700 In: 0  Out: 1825 [Urine:775; Emesis/NG output:1050] Intake/Output this shift: No intake/output data recorded.  Exam: Awake, follows commands Abdomen soft, non-tender  Lab Results:  Recent Labs    04/24/21 0330 04/25/21 0357  WBC 5.5 7.3  HGB 7.7* 7.7*  HCT 23.7* 23.7*  PLT 177 188   BMET Recent Labs    04/24/21 0330 04/25/21 0357  NA 143 148*  K 3.6 3.8  CL 107 112*  CO2 27 25  GLUCOSE 113* 124*  BUN 38* 53*  CREATININE 2.14* 2.04*  CALCIUM 8.8* 8.6*   PT/INR No results for input(s): LABPROT, INR in the last 72 hours. ABG No results for input(s): PHART, HCO3 in the last 72 hours.  Invalid input(s): PCO2, PO2  Studies/Results: DG Abd Portable 1V-Small Bowel Obstruction Protocol-initial, 8 hr delay  Result Date: 04/25/2021 CLINICAL DATA:  Small bowel obstruction.  8 hour delay. EXAM: PORTABLE ABDOMEN - 1 VIEW COMPARISON:  Radiograph yesterday at 3 p.m. CT 04/22/2021 FINDINGS: Again seen enteric contrast in the ascending, descending, and sigmoid colon, this is likely from prior CT. Enteric tube remains in place with tip below the diaphragm, side-port in the region of the distal esophagus. Increased air throughout small bowel throughout the abdomen which is prominent but not abnormally dilated. IMPRESSION: 1. Enteric contrast throughout the colon is likely from prior CT. No new or additional contrast is seen. 2. Increased air throughout small bowel loops in the central abdomen, prominent but not  abnormally distended. Overall findings suggest ileus or small-bowel obstruction that is incomplete. Electronically Signed   By: Keith Rake M.D.   On: 04/25/2021 01:52   DG Abd Portable 1V  Result Date: 04/24/2021 CLINICAL DATA:  Nasogastric tube placement. EXAM: PORTABLE ABDOMEN - 1 VIEW COMPARISON:  None. FINDINGS: Distal tip of nasogastric tube is seen in proximal stomach. Residual contrast is noted in the colon. The bowel gas pattern is normal. No radio-opaque calculi or other significant radiographic abnormality are seen. IMPRESSION: Distal tip of nasogastric tube is seen in proximal stomach. No abnormal bowel dilatation is noted. Electronically Signed   By: Marijo Conception M.D.   On: 04/24/2021 15:29   DG Abd Portable 1V  Result Date: 04/23/2021 CLINICAL DATA:  NG tube placement. EXAM: PORTABLE ABDOMEN - 1 VIEW COMPARISON:  Radiograph and CT yesterday. FINDINGS: Tip of the enteric tube below the diaphragm in the stomach. The side-port is in the region of the gastroesophageal junction. There is enteric contrast within the transverse colon from yesterday's CT. IMPRESSION: Tip of the enteric tube below the diaphragm in the stomach, side-port in the region of the gastroesophageal junction. Recommend advancement of 3 5 cm for optimal placement. Electronically Signed   By: Keith Rake M.D.   On: 04/23/2021 18:30    Anti-infectives: Anti-infectives (From admission, onward)    None       Assessment/Plan: SBO    Acute on chronic CKD CHF DM CVAs Prostate cancer HTN Urinary incontinence  Xray today difficult to see improvement but  not totally obstructed Will continue conservative management with NG and bowel rest Repeat films in the morning     Coralie Keens 04/25/2021

## 2021-04-25 NOTE — Plan of Care (Signed)
  Problem: Clinical Measurements: Goal: Ability to maintain clinical measurements within normal limits will improve Outcome: Progressing Goal: Will remain free from infection Outcome: Progressing Goal: Diagnostic test results will improve Outcome: Progressing Goal: Respiratory complications will improve Outcome: Progressing Goal: Cardiovascular complication will be avoided Outcome: Progressing   Problem: Activity: Goal: Risk for activity intolerance will decrease Outcome: Progressing   Problem: Elimination: Goal: Will not experience complications related to bowel motility Outcome: Progressing

## 2021-04-25 NOTE — Progress Notes (Signed)
Occupational Therapy Treatment Patient Details Name: Thomas Duarte MRN: [id number] DOB: 10/11/1940 Today's Date: 04/25/2021    History of present illness Pt is an 80 y.o. male admitted 04/10/21 with c/o worsening BLE weakness, urinary incontinence; of note, pt recently started prostate CA treatment (completed 4 wks of radiation 2 wks prior). Workup for AKI on CKD 3. Lumbar MRI showed degenerative disease. 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   OT comments  Pt with slower progress towards OT goals (updated and extended accordingly) but remains motivated to participate with therapy. Guided pt in bathing task sitting at sink though pt limited by progressive lightheadedness and nausea with prolonged activity. Unable to obtain accurate BP reading due to cuff malfunction. Pt overall Min A for UB ADLs, Mod A for LB ADLs and Min A for transfers/mobility using RW. Began education on energy conservation strategies and plan to reinforce these strategies during ADLs in next sessions.    Follow Up Recommendations  SNF;Supervision/Assistance - 24 hour    Equipment Recommendations  Tub/shower seat;Other (comment) (Rolling walker)    Recommendations for Other Services      Precautions / Restrictions Precautions Precautions: Fall Precaution Comments: NG tube Restrictions Weight Bearing Restrictions: No       Mobility Bed Mobility Overal bed mobility: Needs Assistance Bed Mobility: Supine to Sit;Sit to Supine;Rolling Rolling: Supervision   Supine to sit: Min assist;HOB elevated Sit to supine: Min assist   General bed mobility comments: Min A to lift trunk, Min A to get B LE back into bed. Pt able to roll without assist for placement of new bed pad    Transfers Overall transfer level: Needs assistance Equipment used: Rolling walker (2 wheeled) Transfers:  Sit to/from Stand Sit to Stand: Min assist Stand pivot transfers: Min assist       General transfer comment: able to stand from bedside with increased time and cues for technique. Min A to power up from chair w/o armrest by end of session. Min A for turning to bed from chair with assist to advance RW    Balance Overall balance assessment: Needs assistance Sitting-balance support: No upper extremity supported;Feet supported Sitting balance-Leahy Scale: Good     Standing balance support: Bilateral upper extremity supported;Single extremity supported;During functional activity Standing balance-Leahy Scale: Poor Standing balance comment: Reliant on UE support                           ADL either performed or assessed with clinical judgement   ADL Overall ADL's : Needs assistance/impaired     Grooming: Set up;Sitting;Wash/dry face;Applying deodorant Grooming Details (indicate cue type and reason): sitting at sink Upper Body Bathing: Minimal assistance;Sitting Upper Body Bathing Details (indicate cue type and reason): to bathe back and for thoroughness, limited by fatigue Lower Body Bathing: Moderate assistance;Sit to/from stand Lower Body Bathing Details (indicate cue type and reason): Able to bathe upper and lower legs; assist for peri care and feet Upper Body Dressing : Minimal assistance;Sitting Upper Body Dressing Details (indicate cue type and reason): to don new gown, assist with lines and sequencing                 Functional mobility during ADLs: Minimal assistance;Rolling walker General ADL Comments: Pt limited by fatigue with tasks then progressive lightheadedness and nausea. Began education on energy conservation strategies  Vision   Vision Assessment?: No apparent visual deficits   Perception     Praxis      Cognition Arousal/Alertness: Awake/alert Behavior During Therapy: WFL for tasks assessed/performed Overall Cognitive Status: No  family/caregiver present to determine baseline cognitive functioning Area of Impairment: Awareness;Problem solving;Safety/judgement                         Safety/Judgement: Decreased awareness of safety;Decreased awareness of deficits Awareness: Emergent Problem Solving: Slow processing;Requires verbal cues;Difficulty sequencing General Comments: requires increased time for command follow but could also be due to [facility]        Exercises     Shoulder Instructions       General Comments Pt reporting lightheadedness with progressed ADLs seated at sink. BP cuff malfunction and unable to obtain reading. Pt reported excessive fatigue and nausea - RN in to assist at end of session. Cleared to disconnect NG suction from wall for session    Pertinent Vitals/ Pain       Pain Assessment: No/denies pain Pain Intervention(s): Monitored during session  Home Living                                          Prior Functioning/Environment              Frequency  Min 2X/week        Progress Toward Goals  OT Goals(current goals can now be found in the care plan section)  Progress towards OT goals: Progressing toward goals  Acute Rehab OT Goals Patient Stated Goal: to improve strength and return to independence OT Goal Formulation: With patient Time For Goal Achievement: 05/09/21 Potential to Achieve Goals: Good ADL Goals Pt Will Perform Grooming: standing;with min guard assist Pt Will Perform Lower Body Dressing: with min assist;with adaptive equipment;sit to/from stand Pt Will Transfer to Toilet: ambulating;bedside commode;with min guard assist Pt Will Perform Toileting - Clothing Manipulation and hygiene: with min guard assist;sit to/from stand Pt/caregiver will Perform Home Exercise Program: Increased strength;Both right and left upper extremity;With written HEP provided Additional ADL Goal #1: Pt will complete bed mobility with supervision as precursor  to ADLs.  Plan Discharge plan remains appropriate;Frequency remains appropriate    Co-evaluation                 AM-PAC OT "6 Clicks" Daily Activity     Outcome Measure   Help from another person eating meals?: Total (NPO) Help from another person taking care of personal grooming?: A Little Help from another person toileting, which includes using toliet, bedpan, or urinal?: A Lot Help from another person bathing (including washing, rinsing, drying)?: A Lot Help from another person to put on and taking off regular upper body clothing?: A Little Help from another person to put on and taking off regular lower body clothing?: A Lot 6 Click Score: 13    End of Session Equipment Utilized During Treatment: Rolling walker;Gait belt  OT Visit Diagnosis: Other abnormalities of gait and mobility (R26.89);Muscle weakness (generalized) (M62.81)   Activity Tolerance Patient limited by fatigue   Patient Left in bed;with call bell/phone within reach;with bed alarm set   Nurse Communication Mobility status;Other (comment) (Rn present to assist at end of session)        Time: [phone] OT Time Calculation (min): 58 min  Charges: OT General Charges $OT Visit:  1 Visit OT Treatments $Self Care/Home Management : 23-37 mins $Therapeutic Activity: 23-37 mins  Malachy Chamber, OTR/L Acute Rehab Services Office: [phone]    Layla Maw 04/25/2021, 2:50 PM

## 2021-04-25 NOTE — Progress Notes (Addendum)
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  At this time, patient is awaiting for skilled nursing facility placement but has been denied. On 04/21/2021, patient started having multiple episodes of nausea, vomiting.  X-ray of the abdomen showed some ileus-like features.  General surgery was consulted.  Assessment/Plan:  Partial small bowel obstruction  General surgery on board.  On conservative treatment.  Continue IV fluids, n.p.o., NG tube with intermittent suction.  CT scan shows transition point.  Small bowel follow-through has been requested.  Still with significant NG tube drainage.  Follow abdominal x-ray in a.m. Follow surgical recommendation.   CKD stage IIIb.  Continue to hold Lasix.  Continue to monitor BMP.  Currently on IV fluids.  Creatinine of 2.04.  Generalized weakness with ambulatory dysfunction.  Physical therapy has recommended skilled nursing facility placement.  MRI showed multilevel lumbar disc disease. Patient will follow-up with neurosurgery as outpatient.  Awaiting for skilled nursing facility placement  Urinary retention/ incontinence History of prostate cancer s/p radiation.  Continue supportive care.  Elevated troponin likely secondary to  demand ischemia and AKI.  No acute issues.  Congestive heart failure:   Lasix on hold.  Has been resumed on IV fluids due to n.p.o. status.  Patient is negative for 2324 mL.  If continues to be positive balance, then might need Lasix.  Discontinued sodium bicarbonate tablet.  History of prostate cancer.  Status post radiation treatment.  Follows up  with urology as outpatient.   Bradycardia.  On metoprolol.  EKG showing PVCs and bigeminy pattern.  We will continue to monitor.  Currently on IV metoprolol due to n.p.o. status.  History of stroke.  On aspirin and Plavix at home.  Currently NPO.  Continue to hold due to n.p.o. status   Code Status: Full  Family Communication:  Spoke with the spouse on 04/23/2021.  Unable to reach her today.  Disposition Plan:   Status is: Inpatient   Dispo: The patient is from: Home              Anticipated d/c is to: Skilled nursing facility.              Anticipated d/c date is: Unknown at this time, .              Patient currently not medically stable for discharge  Consultants: General surgery  Procedures: NG tube placement  Antimicrobials: None  DVT prophylaxis: Heparin subcu  Subjective  Today, patient was seen and examined at bedside. Passed gas and bowel movement yesterday but still has significant NG tube output. No pain, nausea or vomiting.   Objective: Vitals:   04/24/21 0441 04/24/21 1813 04/24/21 2058 04/25/21 0326  BP: (!) 161/78 (!) 154/71 (!) 160/69 (!) 166/88  Pulse: 77 91 83 94  Resp: 18 18 17 20   Temp: 98 F (36.7 C) 97.6 F (36.4 C) 98 F (36.7 C) 98.3 F (36.8 C)  TempSrc: Oral Oral Oral Oral  SpO2: 98%  95% 97%  Weight: 95.6 kg     Height:        Intake/Output Summary (Last 24 hours) at 04/25/2021 0817 Last data filed at 04/25/2021 0600 Gross  per 24 hour  Intake 0 ml  Output 1825 ml  Net -1825 ml    Filed Weights   04/22/21 0604 04/23/21 0100 04/24/21 0441  Weight: 96.5 kg 96 kg 95.6 kg   Body mass index is 27.81 kg/m.  Physical exam:  General:  Average built, not in obvious distress, NG tube in place with greenish fluid in NG. HENT:   No scleral pallor or icterus noted. Oral mucosa is dry. Chest:    Diminished breath sounds bilaterally. No crackles or wheezes.  CVS: S1 &S2 heard. No murmur.  Regular rate and rhythm. Abdomen: Soft, nontender,  nondistended.  Bowel sounds are heard.   Extremities: No cyanosis, clubbing or edema.  Peripheral pulses are palpable. Psych: Alert, awake and oriented, normal mood CNS:  No cranial nerve deficits.  Power equal in all extremities.   Skin: Warm and dry.  No rashes noted.   Data Reviewed:  CBC: Recent Labs  Lab 04/22/21 0341 04/23/21 0154 04/24/21 0330 04/25/21 0357  WBC 7.1 5.8 5.5 7.3  HGB 8.2* 7.9* 7.7* 7.7*  HCT 25.3* 23.8* 23.7* 23.7*  MCV 96.6 97.1 97.1 99.2  PLT 181 167 177 329    Basic Metabolic Panel: Recent Labs  Lab 04/19/21 2058 04/19/21 2200 04/22/21 0341 04/23/21 0154 04/24/21 0330 04/25/21 0357  NA 134*  --  141 138 143 148*  K 3.9  --  3.9 3.7 3.6 3.8  CL 109  --  108 106 107 112*  CO2 19*  --  26 26 27 25   GLUCOSE 208*  --  119* 124* 113* 124*  BUN 37*  --  40* 37* 38* 53*  CREATININE 2.26*  --  2.54* 2.40* 2.14* 2.04*  CALCIUM 8.7*  --  8.8* 8.6* 8.8* 8.6*  MG  --  2.0 2.1 2.0 2.2 2.3  PHOS  --   --  4.1 3.5 3.4 2.9    GFR: Estimated Creatinine Clearance: 32.1 mL/min (A) (by C-G formula based on SCr of 2.04 mg/dL (H)). Liver Function Tests: Recent Labs  Lab 04/23/21 0154  AST 13*  ALT 11  ALKPHOS 69  BILITOT 0.5  PROT 5.2*  ALBUMIN 2.4*    No results for input(s): LIPASE, AMYLASE in the last 168 hours. No results for input(s): AMMONIA in the last 168 hours. Coagulation Profile: No results for input(s): INR, PROTIME in the last 168 hours. Cardiac Enzymes: No results for input(s): CKTOTAL, CKMB, CKMBINDEX, TROPONINI in the last 168 hours. BNP (last 3 results) No results for input(s): PROBNP in the last 8760 hours. HbA1C: No results for input(s): HGBA1C in the last 72 hours. CBG: Recent Labs  Lab 04/24/21 0613 04/24/21 1223 04/24/21 1617 04/24/21 2111 04/25/21 0557  GLUCAP 99 123* 144* 115* 129*    Lipid Profile: No results for input(s): CHOL, HDL, LDLCALC, TRIG, CHOLHDL, LDLDIRECT in the last 72 hours. Thyroid Function  Tests: No results for input(s): TSH, T4TOTAL, FREET4, T3FREE, THYROIDAB in the last 72 hours. Anemia Panel: No results for input(s): VITAMINB12, FOLATE, FERRITIN, TIBC, IRON, RETICCTPCT in the last 72 hours. Urine analysis:    Component Value Date/Time   COLORURINE YELLOW 04/11/2021 0814   APPEARANCEUR HAZY (A) 04/11/2021 0814   APPEARANCEUR Clear 01/01/2021 1448   LABSPEC 1.010 04/11/2021 0814   PHURINE 6.0 04/11/2021 [place] 04/11/2021 0814   HGBUR NEGATIVE 04/11/2021 0814   [place] 04/11/2021 0814   BILIRUBINUR Negative 01/01/2021 [place] 04/11/2021 0814   PROTEINUR NEGATIVE  04/11/2021 0814   NITRITE NEGATIVE 04/11/2021 0814   LEUKOCYTESUR NEGATIVE 04/11/2021 0814   Sepsis Labs: @LABRCNTIP (procalcitonin:4,lacticidven:4)  ) No results found for this or any previous visit (from the past 240 hour(s)).     Studies: DG Abd Portable 1V-Small Bowel Obstruction Protocol-initial, 8 hr delay  Result Date: 04/25/2021 CLINICAL DATA:  Small bowel obstruction.  8 hour delay. EXAM: PORTABLE ABDOMEN - 1 VIEW COMPARISON:  Radiograph yesterday at 3 p.m. CT 04/22/2021 FINDINGS: Again seen enteric contrast in the ascending, descending, and sigmoid colon, this is likely from prior CT. Enteric tube remains in place with tip below the diaphragm, side-port in the region of the distal esophagus. Increased air throughout small bowel throughout the abdomen which is prominent but not abnormally dilated. IMPRESSION: 1. Enteric contrast throughout the colon is likely from prior CT. No new or additional contrast is seen. 2. Increased air throughout small bowel loops in the central abdomen, prominent but not abnormally distended. Overall findings suggest ileus or small-bowel obstruction that is incomplete. Electronically Signed   By: Keith Rake M.D.   On: 04/25/2021 01:52   DG Abd Portable 1V  Result Date: 04/24/2021 CLINICAL DATA:  Nasogastric tube  placement. EXAM: PORTABLE ABDOMEN - 1 VIEW COMPARISON:  None. FINDINGS: Distal tip of nasogastric tube is seen in proximal stomach. Residual contrast is noted in the colon. The bowel gas pattern is normal. No radio-opaque calculi or other significant radiographic abnormality are seen. IMPRESSION: Distal tip of nasogastric tube is seen in proximal stomach. No abnormal bowel dilatation is noted. Electronically Signed   By: Marijo Conception M.D.   On: 04/24/2021 15:29    Scheduled Meds:  heparin  5,000 Units Subcutaneous Q12H   insulin aspart  0-9 Units Subcutaneous TID WC   metoprolol tartrate  2.5 mg Intravenous Q6H   tamsulosin  0.8 mg Oral QHS    Continuous Infusions:  sodium chloride 75 mL/hr at 04/24/21 1300   promethazine (PHENERGAN) injection (IM or IVPB) Stopped (04/21/21 1930)     LOS: 15 days    Flora Lipps, MD [facility] 04/25/2021, 8:17 AM

## 2021-04-26 ENCOUNTER — Inpatient Hospital Stay (HOSPITAL_COMMUNITY): Payer: Medicare HMO

## 2021-04-26 DIAGNOSIS — E876 Hypokalemia: Secondary | ICD-10-CM

## 2021-04-26 LAB — BASIC METABOLIC PANEL
Anion gap: 9 (ref 5–15)
BUN: 50 mg/dL — ABNORMAL HIGH (ref 8–23)
CO2: 29 mmol/L (ref 22–32)
Calcium: 8.9 mg/dL (ref 8.9–10.3)
Chloride: 115 mmol/L — ABNORMAL HIGH (ref 98–111)
Creatinine, Ser: 2.15 mg/dL — ABNORMAL HIGH (ref 0.61–1.24)
GFR, Estimated: 30 mL/min — ABNORMAL LOW (ref >60)
Glucose, Bld: 121 mg/dL — ABNORMAL HIGH (ref 70–99)
Potassium: 3.3 mmol/L — ABNORMAL LOW (ref 3.5–5.1)
Sodium: 153 mmol/L — ABNORMAL HIGH (ref 135–145)

## 2021-04-26 LAB — CBC
HCT: 23.4 % — ABNORMAL LOW (ref 39.0–52.0)
Hemoglobin: 7.3 g/dL — ABNORMAL LOW (ref 13.0–17.0)
MCH: 31.6 pg (ref 26.0–34.0)
MCHC: 31.2 g/dL (ref 30.0–36.0)
MCV: 101.3 fL — ABNORMAL HIGH (ref 80.0–100.0)
Platelets: 188 10*3/uL (ref 150–400)
RBC: 2.31 MIL/uL — ABNORMAL LOW (ref 4.22–5.81)
RDW: 13.5 % (ref 11.5–15.5)
WBC: 7.7 10*3/uL (ref 4.0–10.5)
nRBC: 0 % (ref 0.0–0.2)

## 2021-04-26 LAB — GLUCOSE, CAPILLARY
Glucose-Capillary: 120 mg/dL — ABNORMAL HIGH (ref 70–99)
Glucose-Capillary: 128 mg/dL — ABNORMAL HIGH (ref 70–99)
Glucose-Capillary: 144 mg/dL — ABNORMAL HIGH (ref 70–99)
Glucose-Capillary: 163 mg/dL — ABNORMAL HIGH (ref 70–99)

## 2021-04-26 LAB — MAGNESIUM: Magnesium: 2.5 mg/dL — ABNORMAL HIGH (ref 1.7–2.4)

## 2021-04-26 MED ORDER — POTASSIUM CL IN DEXTROSE 5% 20 MEQ/L IV SOLN
20.0000 meq | INTRAVENOUS | Status: DC
  Administered 2021-04-26 – 2021-04-27 (×3): 20 meq via INTRAVENOUS
  Filled 2021-04-26 (×3): qty 1000

## 2021-04-26 MED ORDER — POTASSIUM CL IN DEXTROSE 5% 20 MEQ/L IV SOLN
20.0000 meq | INTRAVENOUS | Status: DC
  Administered 2021-04-26 (×2): 20 meq via INTRAVENOUS
  Filled 2021-04-26: qty 1000

## 2021-04-26 NOTE — Progress Notes (Addendum)
Progress Note     Subjective: Had a very small BM and passing flatus. Mild nausea yesterday but none today. He denies abdominal pain at this time  Objective: Vital signs in last 24 hours: Temp:  [97.4 F (36.3 C)-97.6 F (36.4 C)] 97.6 F (36.4 C) (09/10 0809) Pulse Rate:  [51-91] 85 (09/10 0809) Resp:  [16-18] 16 (09/10 0323) BP: (156-179)/(59-95) 156/83 (09/10 0809) SpO2:  [94 %-99 %] 94 % (09/10 0809) Weight:  [90.4 kg] (P) 90.4 kg (09/10 0500) Last BM Date: 04/25/21  Intake/Output from previous day: 09/09 0701 - 09/10 0700 In: 0  Out: 1675 [Urine:775; Emesis/NG output:900] Intake/Output this shift: Total I/O In: -  Out: 150 [Urine:150]  PE: General: pleasant, WD, male who is laying in bed in NAD HEENT: head is normocephalic, atraumatic. Mouth is pink and moist Heart:  Palpable radial and pedal pulses bilaterally Lungs:  Respiratory effort nonlabored Abd: soft, NT, ND, +BS, well healed midline abdominal scar. NG with dark bilious output MSK: all 4 extremities are symmetrical with no cyanosis, clubbing, or edema. Skin: warm and dry with no masses, lesions, or rashes Psych: A&Ox3 with an appropriate affect.    Lab Results:  Recent Labs    04/25/21 0357 04/26/21 0335  WBC 7.3 7.7  HGB 7.7* 7.3*  HCT 23.7* 23.4*  PLT 188 188    BMET Recent Labs    04/25/21 0357 04/26/21 0335  NA 148* 153*  K 3.8 3.3*  CL 112* 115*  CO2 25 29  GLUCOSE 124* 121*  BUN 53* 50*  CREATININE 2.04* 2.15*  CALCIUM 8.6* 8.9    PT/INR No results for input(s): LABPROT, INR in the last 72 hours. CMP     Component Value Date/Time   NA 153 (H) 04/26/2021 0335   NA 141 09/09/2020 1617   K 3.3 (L) 04/26/2021 0335   CL 115 (H) 04/26/2021 0335   CO2 29 04/26/2021 0335   GLUCOSE 121 (H) 04/26/2021 0335   BUN 50 (H) 04/26/2021 0335   BUN 39 (H) 09/09/2020 1617   CREATININE 2.15 (H) 04/26/2021 0335   CALCIUM 8.9 04/26/2021 0335   PROT 5.2 (L) 04/23/2021 0154   ALBUMIN 2.4  (L) 04/23/2021 0154   AST 13 (L) 04/23/2021 0154   ALT 11 04/23/2021 0154   ALKPHOS 69 04/23/2021 0154   BILITOT 0.5 04/23/2021 0154   GFRNONAA 30 (L) 04/26/2021 0335   GFRAA 40 (L) 09/09/2020 1617   Lipase     Component Value Date/Time   LIPASE 42 04/10/2021 1438       Studies/Results: DG Abd Portable 1V-Small Bowel Obstruction Protocol-initial, 8 hr delay  Result Date: 04/25/2021 CLINICAL DATA:  Small bowel obstruction.  8 hour delay. EXAM: PORTABLE ABDOMEN - 1 VIEW COMPARISON:  Radiograph yesterday at 3 p.m. CT 04/22/2021 FINDINGS: Again seen enteric contrast in the ascending, descending, and sigmoid colon, this is likely from prior CT. Enteric tube remains in place with tip below the diaphragm, side-port in the region of the distal esophagus. Increased air throughout small bowel throughout the abdomen which is prominent but not abnormally dilated. IMPRESSION: 1. Enteric contrast throughout the colon is likely from prior CT. No new or additional contrast is seen. 2. Increased air throughout small bowel loops in the central abdomen, prominent but not abnormally distended. Overall findings suggest ileus or small-bowel obstruction that is incomplete. Electronically Signed   By: Keith Rake M.D.   On: 04/25/2021 01:52   DG Abd Portable 1V  Result  Date: 04/24/2021 CLINICAL DATA:  Nasogastric tube placement. EXAM: PORTABLE ABDOMEN - 1 VIEW COMPARISON:  None. FINDINGS: Distal tip of nasogastric tube is seen in proximal stomach. Residual contrast is noted in the colon. The bowel gas pattern is normal. No radio-opaque calculi or other significant radiographic abnormality are seen. IMPRESSION: Distal tip of nasogastric tube is seen in proximal stomach. No abnormal bowel dilatation is noted. Electronically Signed   By: Marijo Conception M.D.   On: 04/24/2021 15:29    Anti-infectives: Anti-infectives (From admission, onward)    None        Assessment/Plan SBO CT scan with findings  compatible with mid small-bowel obstruction.  Transition point is seen in the mid abdomen. No free air or pneumatosis. - h/o of ex lap  - xray yesterday with contrast in colon likely from prior CT but still with findings of ileus vs pSBO - passing flatus and very small BM but still with high NG output (900 ml last 24 H.) - keep NGT to LIWS today - consider PICC/TPN tomorrow if he does not continue to improve - keep K>4 and Mg > 2 for bowel function   FEN - NPO/NGT/IVFs VTE - hold plavix, on heparin subq ID - none   Acute on chronic CKD CHF DM CVAs Prostate cancer HTN Urinary incontinence    LOS: 16 days    Thomas Duarte, RaLPh [facility] Surgery 04/26/2021, 9:41 AM Please see Amion for pager number during day hours 7:00am-4:30pm

## 2021-04-26 NOTE — Plan of Care (Signed)
  Problem: Clinical Measurements: Goal: Ability to maintain clinical measurements within normal limits will improve Outcome: Progressing Goal: Will remain free from infection Outcome: Progressing Goal: Diagnostic test results will improve Outcome: Progressing Goal: Respiratory complications will improve Outcome: Progressing Goal: Cardiovascular complication will be avoided Outcome: Progressing   Problem: Activity: Goal: Risk for activity intolerance will decrease Outcome: Progressing   Problem: Elimination: Goal: Will not experience complications related to bowel motility Outcome: Progressing   Problem: Skin Integrity: Goal: Risk for impaired skin integrity will decrease Outcome: Progressing

## 2021-04-26 NOTE — Progress Notes (Signed)
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  X-ray of the abdomen showed some ileus-like features.  General surgery was consulted.  Patient undergoing conservative treatment at this time.  Assessment/Plan:  Partial small bowel obstruction likely ileus General surgery on board.  On conservative treatment.  Continue IV fluids, n.p.o., NG tube with intermittent suction.  CT scan shows transition point.  Small bowel follow-through shows passage of contrast in the distal colon.    Follow abdominal x-ray in a.m. Follow surgical recommendation.  If patient undergoes prolonged ileus might need TPN.  Surgery to make a decision by 04/27/2021.  CKD stage IIIb.  Lasix on hold.  Has been started on D5 water with IV KCl.  Continue to monitor closely.  Generalized weakness with ambulatory dysfunction.  Physical therapy has recommended skilled nursing facility placement.  MRI showed multilevel lumbar disc disease. Patient will follow-up with neurosurgery as outpatient.  Awaiting for skilled nursing facility placement  Urinary retention/ incontinence History of prostate cancer status post radiation.  Continue supportive care.  Elevated troponin likely secondary to  demand ischemia and acute kidney injury.  No acute issues.  Congestive heart failure:   Lasix on hold.  Has been resumed on IV fluids due to n.p.o. status.  Patient is negative for 4574 mL.  If continues to be positive balance, then might need Lasix.  Continue D5 water with KCl@100  mL/h for  now.  History of prostate cancer.  Status post radiation treatment.  Follows up with urology as outpatient.   Bradycardia.   Improved at this time.  EKG showing PVCs and bigeminy pattern.  We will continue to monitor.  Currently on IV metoprolol due to n.p.o. status.  History of stroke.  On aspirin and Plavix at home.  Currently NPO.  Continue to hold due to n.p.o. status   Hypokalemia.  We will replenish potassium through IV at this time.  Hypernatremia.  Patient has been started on D5 water with KCl at this time.  Will monitor sodium levels.  Mild anemia.  No overt blood loss.  MCV mildly elevated.  Code Status: Full  Family Communication:  Spoke with the spouse on 04/26/2021 and updated her about the clinical condition of the patient..    Disposition Plan:   Status is: Inpatient   Dispo: The patient is from: Home              Anticipated d/c is to: Skilled nursing facility.              Anticipated d/c date is: Unknown at this time, .              Patient currently not medically stable for discharge  Consultants: General surgery  Procedures: NG tube placement  Antimicrobials: None  DVT prophylaxis: Heparin subcu  Subjective Today, patient was seen and examined at bedside.  Patient denies any pain, nausea or vomiting.  He still got NG tube with dark fluid.  Has been passing intermittently gas but no bowel movement.  Objective: Vitals:   04/26/21 0500 04/26/21 0622 04/26/21 0809 04/26/21 1200  BP:  (!) (P) 146/57 Marland Kitchen)  156/83 (!) 184/68  Pulse:   85 86  Resp:    18  Temp:   97.6 F (36.4 C) 97.6 F (36.4 C)  TempSrc:   Oral Oral  SpO2:   94% 98%  Weight: (P) 90.4 kg     Height:        Intake/Output Summary (Last 24 hours) at 04/26/2021 1400 Last data filed at 04/26/2021 1200 Gross per 24 hour  Intake 0 ml  Output 2050 ml  Net -2050 ml    Filed Weights   04/24/21 0441 04/26/21 0323 04/26/21 0500  Weight: 95.6 kg 90.4 kg (P) 90.4 kg   Body mass index  is 26.29 kg/m (pended).  Physical exam: General:  Average built, not in obvious distress, NG tube in place with greenish fluid in NG. HENT:   No scleral pallor or icterus noted. Oral mucosa is dry. Chest:    Diminished breath sounds bilaterally. CVS: S1 &S2 heard. No murmur.  Regular rate and rhythm. Abdomen: Soft, nontender, nondistended.  Bowel sounds are heard.   Extremities: No cyanosis, clubbing or edema.  Peripheral pulses are palpable. Psych: Alert, awake and oriented, normal mood CNS:  No cranial nerve deficits.  Power equal in all extremities.   Skin: Warm and dry.  No rashes noted.   Data Reviewed:  CBC: Recent Labs  Lab 04/22/21 0341 04/23/21 0154 04/24/21 0330 04/25/21 0357 04/26/21 0335  WBC 7.1 5.8 5.5 7.3 7.7  HGB 8.2* 7.9* 7.7* 7.7* 7.3*  HCT 25.3* 23.8* 23.7* 23.7* 23.4*  MCV 96.6 97.1 97.1 99.2 101.3*  PLT 181 167 177 188 382    Basic Metabolic Panel: Recent Labs  Lab 04/22/21 0341 04/23/21 0154 04/24/21 0330 04/25/21 0357 04/26/21 0335  NA 141 138 143 148* 153*  K 3.9 3.7 3.6 3.8 3.3*  CL 108 106 107 112* 115*  CO2 26 26 27 25 29   GLUCOSE 119* 124* 113* 124* 121*  BUN 40* 37* 38* 53* 50*  CREATININE 2.54* 2.40* 2.14* 2.04* 2.15*  CALCIUM 8.8* 8.6* 8.8* 8.6* 8.9  MG 2.1 2.0 2.2 2.3 2.5*  PHOS 4.1 3.5 3.4 2.9  --     GFR: Estimated Creatinine Clearance: 30.5 mL/min (A) (by C-G formula based on SCr of 2.15 mg/dL (H)). Liver Function Tests: Recent Labs  Lab 04/23/21 0154  AST 13*  ALT 11  ALKPHOS 69  BILITOT 0.5  PROT 5.2*  ALBUMIN 2.4*    No results for input(s): LIPASE, AMYLASE in the last 168 hours. No results for input(s): AMMONIA in the last 168 hours. Coagulation Profile: No results for input(s): INR, PROTIME in the last 168 hours. Cardiac Enzymes: No results for input(s): CKTOTAL, CKMB, CKMBINDEX, TROPONINI in the last 168 hours. BNP (last 3 results) No results for input(s): PROBNP in the last 8760 hours. HbA1C: No  results for input(s): HGBA1C in the last 72 hours. CBG: Recent Labs  Lab 04/25/21 1049 04/25/21 1602 04/25/21 2125 04/26/21 0549 04/26/21 1120  GLUCAP 128* 138* 114* 120* 128*    Lipid Profile: No results for input(s): CHOL, HDL, LDLCALC, TRIG, CHOLHDL, LDLDIRECT in the last 72 hours. Thyroid Function Tests: No results for input(s): TSH, T4TOTAL, FREET4, T3FREE, THYROIDAB in the last 72 hours. Anemia Panel: No results for input(s): VITAMINB12, FOLATE, FERRITIN, TIBC, IRON, RETICCTPCT in the last 72 hours. Urine analysis:    Component Value Date/Time   COLORURINE YELLOW 04/11/2021 0814   APPEARANCEUR HAZY (A) 04/11/2021 0814   APPEARANCEUR Clear 01/01/2021 1448   LABSPEC 1.010  04/11/2021 [place] 6.0 04/11/2021 0814   GLUCOSEU NEGATIVE 04/11/2021 0814   HGBUR NEGATIVE 04/11/2021 0814   BILIRUBINUR NEGATIVE 04/11/2021 0814   BILIRUBINUR Negative 01/01/2021 [place] 04/11/2021 0814   PROTEINUR NEGATIVE 04/11/2021 0814   NITRITE NEGATIVE 04/11/2021 0814   LEUKOCYTESUR NEGATIVE 04/11/2021 0814   Sepsis Labs: @LABRCNTIP (procalcitonin:4,lacticidven:4)  ) No results found for this or any previous visit (from the past 240 hour(s)).    Studies: DG Abd Portable 1V  Result Date: 04/26/2021 CLINICAL DATA:  Weakness and fatigue. Evaluate for small bowel obstruction. EXAM: PORTABLE ABDOMEN - 1 VIEW COMPARISON:  Yesterday FINDINGS: Nasogastric tube terminates in the stomach with side port just above the gastroesophageal junction, similar. On both supine views, no gross free intraperitoneal air is seen. Contrast within normal caliber colon. Gas-filled small bowel loops are upper normal caliber, without focal transition identified. IMPRESSION: Upper normal gas-filled small bowel loops, favoring adynamic ileus. Further passage of contrast into the distal colon. Electronically Signed   By: Abigail Miyamoto M.D.   On: 04/26/2021 10:50    Scheduled Meds:  heparin  5,000  Units Subcutaneous Q12H   insulin aspart  0-9 Units Subcutaneous TID WC   metoprolol tartrate  2.5 mg Intravenous Q6H   tamsulosin  0.8 mg Oral QHS    Continuous Infusions:  dextrose 5 % with KCl 20 mEq / L 20 mEq (04/26/21 1241)   promethazine (PHENERGAN) injection (IM or IVPB) Stopped (04/21/21 1930)     LOS: 16 days    Flora Lipps, MD [facility] 04/26/2021, 2:00 PM

## 2021-04-26 NOTE — Plan of Care (Signed)

## 2021-04-27 ENCOUNTER — Inpatient Hospital Stay (HOSPITAL_COMMUNITY): Payer: Medicare HMO

## 2021-04-27 ENCOUNTER — Inpatient Hospital Stay: Payer: Self-pay

## 2021-04-27 DIAGNOSIS — N179 Acute kidney failure, unspecified: Principal | ICD-10-CM

## 2021-04-27 DIAGNOSIS — E870 Hyperosmolality and hypernatremia: Secondary | ICD-10-CM

## 2021-04-27 LAB — CBC
HCT: 20.5 % — ABNORMAL LOW (ref 39.0–52.0)
Hemoglobin: 6.3 g/dL — CL (ref 13.0–17.0)
MCH: 31.2 pg (ref 26.0–34.0)
MCHC: 30.7 g/dL (ref 30.0–36.0)
MCV: 101.5 fL — ABNORMAL HIGH (ref 80.0–100.0)
Platelets: 194 10*3/uL (ref 150–400)
RBC: 2.02 MIL/uL — ABNORMAL LOW (ref 4.22–5.81)
RDW: 13.6 % (ref 11.5–15.5)
WBC: 7.6 10*3/uL (ref 4.0–10.5)
nRBC: 0 % (ref 0.0–0.2)

## 2021-04-27 LAB — GLUCOSE, CAPILLARY
Glucose-Capillary: 175 mg/dL — ABNORMAL HIGH (ref 70–99)
Glucose-Capillary: 187 mg/dL — ABNORMAL HIGH (ref 70–99)
Glucose-Capillary: 114 mg/dL — ABNORMAL HIGH (ref 70–99)
Glucose-Capillary: 159 mg/dL — ABNORMAL HIGH (ref 70–99)

## 2021-04-27 LAB — COMPREHENSIVE METABOLIC PANEL
ALT: 10 U/L (ref 0–44)
AST: 15 U/L (ref 15–41)
Albumin: 2.2 g/dL — ABNORMAL LOW (ref 3.5–5.0)
Alkaline Phosphatase: 48 U/L (ref 38–126)
Anion gap: 10 (ref 5–15)
BUN: 52 mg/dL — ABNORMAL HIGH (ref 8–23)
CO2: 34 mmol/L — ABNORMAL HIGH (ref 22–32)
Calcium: 8.6 mg/dL — ABNORMAL LOW (ref 8.9–10.3)
Chloride: 110 mmol/L (ref 98–111)
Creatinine, Ser: 2.38 mg/dL — ABNORMAL HIGH (ref 0.61–1.24)
GFR, Estimated: 27 mL/min — ABNORMAL LOW (ref >60)
Glucose, Bld: 187 mg/dL — ABNORMAL HIGH (ref 70–99)
Potassium: 3.2 mmol/L — ABNORMAL LOW (ref 3.5–5.1)
Sodium: 154 mmol/L — ABNORMAL HIGH (ref 135–145)
Total Bilirubin: 0.6 mg/dL (ref 0.3–1.2)
Total Protein: 5 g/dL — ABNORMAL LOW (ref 6.5–8.1)

## 2021-04-27 LAB — MAGNESIUM: Magnesium: 2.5 mg/dL — ABNORMAL HIGH (ref 1.7–2.4)

## 2021-04-27 LAB — ABO/RH: ABO/RH(D): A POS

## 2021-04-27 LAB — PREPARE RBC (CROSSMATCH)

## 2021-04-27 MED ORDER — PANTOPRAZOLE INFUSION (NEW) - SIMPLE MED
8.0000 mg/h | INTRAVENOUS | Status: DC
  Administered 2021-04-27 – 2021-04-29 (×4): 8 mg/h via INTRAVENOUS
  Filled 2021-04-27: qty 100
  Filled 2021-04-27: qty 80
  Filled 2021-04-27: qty 100
  Filled 2021-04-27 (×3): qty 80

## 2021-04-27 MED ORDER — CHLORHEXIDINE GLUCONATE CLOTH 2 % EX PADS
6.0000 | MEDICATED_PAD | Freq: Every day | CUTANEOUS | Status: DC
  Administered 2021-04-28 – 2021-05-01 (×4): 6 via TOPICAL

## 2021-04-27 MED ORDER — SODIUM CHLORIDE 0.9% FLUSH: 10.0000 mL | INTRAVENOUS | Status: DC | PRN

## 2021-04-27 MED ORDER — SODIUM CHLORIDE 0.9 % IV SOLN
20.0000 ug | Freq: Once | INTRAVENOUS | Status: AC
  Administered 2021-04-27: 20 ug via INTRAVENOUS
  Filled 2021-04-27: qty 5

## 2021-04-27 MED ORDER — SODIUM CHLORIDE 0.9% IV SOLUTION: Freq: Once | INTRAVENOUS | Status: AC

## 2021-04-27 MED ORDER — DIPHENHYDRAMINE HCL 50 MG/ML IJ SOLN
25.0000 mg | Freq: Once | INTRAMUSCULAR | Status: AC
  Administered 2021-04-27: 25 mg via INTRAVENOUS
  Filled 2021-04-27: qty 1

## 2021-04-27 MED ORDER — PANTOPRAZOLE SODIUM 40 MG IV SOLR
40.0000 mg | Freq: Once | INTRAVENOUS | Status: AC
  Administered 2021-04-27: 40 mg via INTRAVENOUS

## 2021-04-27 MED ORDER — ACETAMINOPHEN 325 MG PO TABS
650.0000 mg | ORAL_TABLET | Freq: Once | ORAL | Status: DC
  Filled 2021-04-27: qty 2

## 2021-04-27 MED ORDER — PANTOPRAZOLE SODIUM 40 MG IV SOLR
40.0000 mg | INTRAVENOUS | Status: DC
  Filled 2021-04-27: qty 40

## 2021-04-27 MED ORDER — DIPHENHYDRAMINE HCL 25 MG PO CAPS
25.0000 mg | ORAL_CAPSULE | Freq: Once | ORAL | Status: DC
  Filled 2021-04-27: qty 1

## 2021-04-27 MED ORDER — SODIUM CHLORIDE 0.9% FLUSH
10.0000 mL | Freq: Two times a day (BID) | INTRAVENOUS | Status: DC
Start: 2021-04-27 — End: 2021-05-04
  Administered 2021-04-27 – 2021-05-03 (×10): 10 mL

## 2021-04-27 MED ORDER — ACETAMINOPHEN 650 MG RE SUPP
650.0000 mg | Freq: Once | RECTAL | Status: AC
  Administered 2021-04-27: 650 mg via RECTAL
  Filled 2021-04-27: qty 1

## 2021-04-27 NOTE — Progress Notes (Signed)
PHARMACY - TOTAL PARENTERAL NUTRITION CONSULT NOTE   Indication: Prolonged ileus and Small bowel obstruction  Patient Measurements: Height: 6\' 1"  (185.4 cm) Weight: 89.3 kg (196 lb 13.9 oz) IBW/kg (Calculated) : 79.9 TPN AdjBW (KG): 94.9 Body mass index is 25.97 kg/m.  Assessment: Thomas Duarte admitted 8/25 with SBO. Patient has flatus but no BM x 2 days, has high NG output, likely ileus per Surgery. Pharmacy consulted to start TPN.  Glucose / Insulin: A1c 7.1% (04/10/21). CBGs 120-180s. 4 units sSSI utilized in last 24hrs (prior to TPN start) Electrolytes: Na up to 154, K down to 3.2 (on D5W w/ KCl per MD), CO2 up to 34, Mag 2.5 stable, others WNL Renal: SCr up to 2.38, BUN up to 52 Hepatic: LFTs / Tbili WNL, TG pending, albumin 2.2 Intake / Output; MIVF: NG output 2769ml/24hrs; MIVF: D5+20K at 100 ml/hr GI Imaging: GI Surgeries / Procedures:   Central access: PICC placement planned 9/11 TPN start date: 9/12  Nutritional Goals:  RD Assessment: pending    Current Nutrition:  NPO  Plan:  Due to timing of consult, TPN will be started 9/12. Communicated plan with Surgery Thomas Duarte) TPN labs, nursing orders, and dietitian consult orders placed   Thomas Duarte, PharmD, BCPS Please check AMION for all [place] contact numbers Clinical Pharmacist 04/27/2021 12:31 PM

## 2021-04-27 NOTE — H&P (View-Only) (Signed)
Referring Provider: Dr. Flora Lipps  Primary Care Physician:  Monico Blitz, MD Primary Gastroenterologist: Althia Forts  Reason for Consultation: Bloody NG tube output, anemia   HPI: Thomas Duarte is a 80 y.o. 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  Past exploratory laparotomy with Meckel's diverticulectomy.  He was admitted to the hospital with lower extremity weakness with difficulty ambulating 04/10/2021.  He appeared hypovolemic with AKI on CKD.  He was treated with IV fluids and diuretics were held and his kidney function improved.  Due to his lower extremity weakness lumbar MRI showed evidence of spinal stenosis. Eventual neurosurgery follow-up as an outpatient. He developed nausea and vomiting on 04/21/2021. Abdominal x-ray showed moderately dilated small bowel loops throughout the central abdomen and mid gastric distention concerning for distal small bowel obstruction.  An NG tube was placed.  CTAP 9/6 findings consistent with a small bowel obstruction without evidence of free air or pneumatosis.  General surgery was consulted and conservative treatment was recommended to continue NG tube to LIS.  Surgical intervention was deferred.  He developed dark brown bilious bloody drainage from his NG tube and his hemoglobin dropped from 7.3 down to 6.3 early this morning.  His admission hemoglobin level was 9.0.  He was transfused 1 unit of PRBCs which just completed. Posttransfusion H and H not yet drawn.  His last dose of Plavix was 04/09/2021.  A GI consult was requested for further evaluation regarding upper GI bleed.  The patient is quite fatigued at this time and he is a poor historian.  No family at the bedside.  His RN is present and the  patient recently pulled out his NG tube.  There is approximately 800 cc of bloody brown drainage in the collection container.  Denies having any chest pain, palpitations or shortness of breath.  He denies any prior history of GERD or peptic ulcer disease.  No known prior history of any GI bleeding.  His Epic history documents past expiratory laparotomy/Meckel's diverticulectomy, further details are unclear.  He does not recall if he is ever had an EGD or colonoscopy in his lifetime.  He possibly passed a small bowel movement earlier today.  His last documented BM in epic was 9/3 which was a formed brown stool.  No rectal bleeding or melena observed per the nursing staff today.  No family at the bedside.  He lives with his wife.  CTAP 04/22/2021 without contrast: 1. Findings compatible with mid small-bowel obstruction. Transition point is seen in the mid abdomen. No free air or pneumatosis. 2. Nasogastric tube tip in the mid stomach. 3. Prostate radiotherapy seeds. 4. Small bilateral pleural effusions.    Past Medical History:  Diagnosis Date   BPH (benign prostatic hyperplasia)    Diabetes mellitus without complication ([place])    Hypercholesteremia    Hypertension    Stroke ([place]) 2019   x2; numbness of right thumb and pointer and middle finger, no other deficits    Past Surgical History:  Procedure Laterality Date   CATARACT EXTRACTION W/PHACO Left 10/21/2018   Procedure: CATARACT EXTRACTION PHACO AND INTRAOCULAR LENS PLACEMENT LEFT EYE;  Surgeon: Baruch Goldmann, MD;  Location: [facility];  Service: Ophthalmology;  Laterality: Left;  CDE: 5.18   CATARACT EXTRACTION W/PHACO Right 06/23/2019  Procedure: CATARACT EXTRACTION PHACO AND INTRAOCULAR LENS PLACEMENT (IOC);  Surgeon: Baruch Goldmann, MD;  Location: [facility];  Service: Ophthalmology;  Laterality: Right;  CDE: 15.0   EXPLORATORY LAPAROTOMY     LOA, 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 requests time 1:00   HERNIA REPAIR Left 1960   Inguinal   IR GENERIC HISTORICAL  04/28/2016   IR RADIOLOGIST EVAL & MGMT 04/28/2016 [facility]   SPACE OAR INSTILLATION N/A 12/16/2020   Procedure: SPACE OAR INSTILLATION;  Surgeon: Cleon Gustin, MD;  Location: [facility];  Service: Urology;  Laterality: N/A;   TONSILLECTOMY  1952   adenoidectomy    Prior to Admission medications   Medication Sig Start Date End Date Taking? Authorizing Provider  aspirin 325 MG tablet Take 325 mg by mouth daily.   Yes [provider]  clopidogrel (PLAVIX) 75 MG tablet Take 75 mg by mouth at bedtime.   Yes [provider]  Glycerin-Hypromellose-PEG 400 0.2-0.2-1 % SOLN Place 1 drop into both eyes 3 (three) times daily as needed (dry eyes).   Yes [provider]  ibuprofen (ADVIL) 200 MG tablet Take 400 mg by mouth at bedtime as needed for mild pain.   Yes [provider]  KLOR-CON M10 10 MEQ tablet Take 10 mEq by mouth 2 (two) times daily. 05/03/20  Yes [provider]  lisinopril (PRINIVIL,ZESTRIL) 40 MG tablet Take 40 mg by mouth daily.   Yes [provider]  metFORMIN (GLUCOPHAGE) 500 MG tablet Take 500 mg by mouth daily with breakfast.   Yes [provider]  metoprolol succinate (TOPROL-XL) 25 MG 24 hr tablet Take 25 mg by mouth daily. 05/09/20  Yes [provider]  OVER THE COUNTER MEDICATION Apply 1 application topically 2 (two) times daily as needed (pain). Real Time Pain Relief   Yes [provider]  tamsulosin (FLOMAX) 0.4 MG CAPS capsule Take 0.8 mg by mouth at bedtime.    Yes [provider]  tobramycin (TOBREX) 0.3 % ophthalmic solution Place 1 drop into both eyes every 4 (four) hours as needed (after eye injections until healed).   Yes [provider]  amLODipine  (NORVASC) 10 MG tablet Take 1 tablet (10 mg total) by mouth daily. 04/16/21   Shelly Coss, MD  furosemide (LASIX) 40 MG tablet Take 1 tablet (40 mg total) by mouth daily. 04/16/21   Shelly Coss, MD  Lancets [facility] DELICA PLUS [id number]) [place] SMARTSIG:1 Strip(s) Topical Daily 08/29/20   [provider]  [facility] VERIO test strip 1 each daily. 10/03/20   [provider]  sodium bicarbonate 650 MG tablet Take 1 tablet (650 mg total) by mouth 3 (three) times daily. 04/16/21   Shelly Coss, MD    Current Facility-Administered Medications  Medication Dose Route Frequency Provider Last Rate Last Admin   acetaminophen (TYLENOL) suppository 650 mg  650 mg Rectal Q4H PRN Pokhrel, Laxman, MD       acetaminophen (TYLENOL) suppository 650 mg  650 mg Rectal Once Chotiner, Yevonne Aline, MD       dextrose 5 % with KCl 20 mEq / L  infusion  20 mEq Intravenous Continuous Pokhrel, Laxman, MD 100 mL/hr at 04/27/21 0629 20 mEq at 04/27/21 0629   diphenhydrAMINE (BENADRYL) injection 25 mg  25 mg Intravenous Once Chotiner,  Yevonne Aline, MD       hydrALAZINE (APRESOLINE) injection 10 mg  10 mg Intravenous Q6H PRN Pokhrel, Laxman, MD   10 mg at 04/26/21 0405   insulin aspart (novoLOG) injection 0-9 Units  0-9 Units Subcutaneous TID WC Lequita Halt, MD   2 Units at 04/27/21 1335   metoprolol tartrate (LOPRESSOR) injection 2.5 mg  2.5 mg Intravenous Q6H Pokhrel, Laxman, MD   2.5 mg at 04/27/21 0438   ondansetron (ZOFRAN) tablet 4 mg  4 mg Oral Q6H PRN Lequita Halt, MD       Or   ondansetron [facility]) injection 4 mg  4 mg Intravenous Q6H PRN Wynetta Fines T, MD   4 mg at 04/25/21 1427   pantoprazole (PROTONIX) injection 40 mg  40 mg Intravenous Q24H Richard Miu H, PA-C       polyvinyl alcohol (LIQUIFILM TEARS) 1.4 % ophthalmic solution 1 drop  1 drop Both Eyes PRN Heloise Purpura, RPH   1 drop at 04/19/21 2118   promethazine (PHENERGAN) 12.5 mg in sodium chloride 0.9 % 50 mL IVPB  12.5 mg  Intravenous Q6H PRN Pokhrel, Laxman, MD   Stopping Infusion hung by another clincian at 04/21/21 1930   tamsulosin (FLOMAX) capsule 0.8 mg  0.8 mg Oral QHS Wynetta Fines T, MD   0.8 mg at 04/21/21 2228   tobramycin (TOBREX) 0.3 % ophthalmic solution 1 drop  1 drop Both Eyes Q4H PRN Shelly Coss, MD   1 drop at 04/16/21 1052    Allergies as of 04/10/2021   (No Known Allergies)    History reviewed. No pertinent family history.  Social History   Socioeconomic History   Marital status: Married    Spouse name: Not on file   Number of children: 1   Years of education: Not on file   Highest education level: Not on file  Occupational History   Occupation: retired  Tobacco Use   Smoking status: Never   Smokeless tobacco: Never  Vaping Use   Vaping Use: Never used  Substance and Sexual Activity   Alcohol use: Never   Drug use: Never   Sexual activity: Yes    Birth control/protection: None  Other Topics Concern   Not on file  Social History Narrative   Not on file   Social Determinants of Health   Financial Resource Strain: Not on file  Food Insecurity: Not on file  Transportation Needs: Not on file  Physical Activity: Not on file  Stress: Not on file  Social Connections: Not on file  Intimate Partner Violence: Not on file    Review of Systems: See HPI, all other systems reviewed and are negative Physical Exam:  Vital signs in last 24 hours: Temp:  [97.5 F (36.4 C)-99.9 F (37.7 C)] 97.9 F (36.6 C) (09/11 1210) Pulse Rate:  [87-103] 88 (09/11 1210) Resp:  [16-20] 16 (09/11 1210) BP: (117-159)/(64-96) 121/79 (09/11 1210) SpO2:  [94 %-98 %] 97 % (09/11 1132) Weight:  [89.3 kg] 89.3 kg (09/11 0412) Last BM Date: 04/25/21 General: Ill-appearing 80 year old male in no acute distress. Head:  Normocephalic and atraumatic. Eyes:  No scleral icterus. Conjunctiva pink. Ears:  Normal auditory acuity. Nose:  No deformity, discharge or lesions. Mouth: Poor dentition.  No  ulcers or lesions.  Bloody drainage around mouth. Neck:  Supple. No lymphadenopathy or thyromegaly.  Lungs: Breath sounds clear, diminished in the bases. Heart: S1, S2.  Regular rhythm.  No murmurs. Abdomen: Soft, nontender.  Positive  bowel sounds to all 4 quadrants. Rectal: Deferred. Musculoskeletal:  Symmetrical without gross deformities.  Pulses:  Normal pulses noted. Extremities:  Without clubbing or edema. Neurologic:  Alert and  oriented x4. No focal deficits.  Skin:  Intact without significant lesions or rashes. Psych:  Alert and cooperative. Normal mood and affect.  Intake/Output from previous day: 09/10 0701 - 09/11 0700 In: 2022.4 [P.O.:330; I.V.:1692.4] Out: 1884 [Urine:650; Emesis/NG [id number]] Intake/Output this shift: No intake/output data recorded.  Lab Results: Recent Labs    04/25/21 0357 04/26/21 0335 04/27/21 0147  WBC 7.3 7.7 7.6  HGB 7.7* 7.3* 6.3*  HCT 23.7* 23.4* 20.5*  PLT 188 188 194   BMET Recent Labs    04/25/21 0357 04/26/21 0335 04/27/21 0147  NA 148* 153* 154*  K 3.8 3.3* 3.2*  CL 112* 115* 110  CO2 25 29 34*  GLUCOSE 124* 121* 187*  BUN 53* 50* 52*  CREATININE 2.04* 2.15* 2.38*  CALCIUM 8.6* 8.9 8.6*   LFT Recent Labs    04/27/21 0147  PROT 5.0*  ALBUMIN 2.2*  AST 15  ALT 10  ALKPHOS 48  BILITOT 0.6   PT/INR No results for input(s): LABPROT, INR in the last 72 hours. Hepatitis Panel No results for input(s): HEPBSAG, HCVAB, HEPAIGM, HEPBIGM in the last 72 hours.    Studies/Results: DG Abd Portable 1V  Result Date: 04/26/2021 CLINICAL DATA:  Weakness and fatigue. Evaluate for small bowel obstruction. EXAM: PORTABLE ABDOMEN - 1 VIEW COMPARISON:  Yesterday FINDINGS: Nasogastric tube terminates in the stomach with side port just above the gastroesophageal junction, similar. On both supine views, no gross free intraperitoneal air is seen. Contrast within normal caliber colon. Gas-filled small bowel loops are upper normal  caliber, without focal transition identified. IMPRESSION: Upper normal gas-filled small bowel loops, favoring adynamic ileus. Further passage of contrast into the distal colon. Electronically Signed   By: Abigail Miyamoto M.D.   On: 04/26/2021 10:50   [place] EKG SITE RITE  Result Date: 04/27/2021 If Site Rite image not attached, placement could not be confirmed due to current cardiac rhythm.   IMPRESSION/PLAN:  20) 80 year old male with a partial small bowel obstruction.  CTAP 9/6 findings consistent with limited small bowel obstruction with a transition point seen in the mid abdomen without free air or pneumatosis. He was evaluated by general surgery who recommended conservative management, IV fluids and NG tube to low intermittent suction.  Abdominal x-ray 9/10  Upper normal gas-filled small bowel loops, favoring adynamic ileus with further passage of contrast into the distal colon. He developed 800cc dark brown bilious bloody output from his NG tube today and his Hg dropped from 7.3 -> 6.3.  Transfused 1 unit of PRBCs. Heparin SQ was discontinued.  Last dose of Plavix was 8/24. -NPO -Await posttransfusion H&H result -Pantoprazole 40 mg IV x1 followed by 8 mg/hour infusion -Transfuse to maintain hemoglobin level greater than 8 -RN to replace NG tube, check abdominal x-ray for NG tube placement -Consider EGD 04/28/2021, further recommendations per Dr. Lyndel Safe -General surgery considering PICC line with TPN  2) Chronic anemia. Admission Hg 9.0.   3) CKD stage III. Cr 2.15 -> 2.38  4) Dilated cardiomyopathy, CHF  5) History of CVA. Plavix and ASA discontinued  6) History of prostate cancer   Noralyn Pick  04/27/2021, 2:24 PM     Attending physician's note   I have taken an interval history, reviewed the chart and examined the patient. I agree with the Advanced  Practitioner's note, impression and recommendations.   PSBO with NG tube, starting having UGI bleed. Hb 7.3 to 6.3, just  finished 1U PRBC. HD stable. NG tube with blood. Anemia of chronic disease CKD 3 H/O CVA on ASA/plavix (last dose 7/24)  Plan: -Transfuse another unit of PRBC -IV Protonix drip. -Hold SQ heparin. -Hb/Hct Q8hrs x 3.  Please have 2U PRBC on hold at all times x next 24 hours. -DDAVP pharmacy to dose. -If significant bleeding/becomes HD unstable, transfer to ICU.  Then, emergent EGD or CTA (has CKD).  I will alert GI on call tonight. -Otherwise, reassess in AM.  Likely will need EGD after NG lavage in AM.  Dr. Lorenso Courier taking over GI service tomorrow.  She is aware of him.   Carmell Austria, MD [facility] [phone]

## 2021-04-27 NOTE — Progress Notes (Addendum)
Progress Note     Subjective: Continues to pass flatus but no BM for 2 days. No nausea/emesis or abdominal pain. NG with high output - he and nursing deny high ice chip/sips intake  Objective: Vital signs in last 24 hours: Temp:  [97.5 F (36.4 C)-99.9 F (37.7 C)] 98.8 F (37.1 C) (09/11 0810) Pulse Rate:  [86-103] 100 (09/11 0810) Resp:  [16-20] 16 (09/11 0403) BP: (117-184)/(67-96) 117/67 (09/11 0810) SpO2:  [94 %-98 %] 94 % (09/11 0810) Weight:  [89.3 kg] 89.3 kg (09/11 0412) Last BM Date: 04/25/21  Intake/Output from previous day: 09/10 0701 - 09/11 0700 In: 2022.4 [P.O.:330; I.V.:1692.4] Out: 3300 [Urine:650; Emesis/NG [id number]] Intake/Output this shift: No intake/output data recorded.  PE: General: pleasant, WD, male who is laying in bed in NAD HEENT: head is normocephalic, atraumatic. Mouth is pink and moist Heart:  Palpable radial and pedal pulses bilaterally Lungs:  Respiratory effort nonlabored Abd: soft, NT, ND, +BS, well healed midline abdominal scar. NG with dark brown/red tinged output MSK: all 4 extremities are symmetrical with no cyanosis, clubbing, or edema. Skin: warm and dry with no masses, lesions, or rashes Psych: A&Ox3 with an appropriate affect.    Lab Results:  Recent Labs    04/26/21 0335 04/27/21 0147  WBC 7.7 7.6  HGB 7.3* 6.3*  HCT 23.4* 20.5*  PLT 188 194    BMET Recent Labs    04/26/21 0335 04/27/21 0147  NA 153* 154*  K 3.3* 3.2*  CL 115* 110  CO2 29 34*  GLUCOSE 121* 187*  BUN 50* 52*  CREATININE 2.15* 2.38*  CALCIUM 8.9 8.6*    PT/INR No results for input(s): LABPROT, INR in the last 72 hours. CMP     Component Value Date/Time   NA 154 (H) 04/27/2021 0147   NA 141 09/09/2020 1617   K 3.2 (L) 04/27/2021 0147   CL 110 04/27/2021 0147   CO2 34 (H) 04/27/2021 0147   GLUCOSE 187 (H) 04/27/2021 0147   BUN 52 (H) 04/27/2021 0147   BUN 39 (H) 09/09/2020 1617   CREATININE 2.38 (H) 04/27/2021 0147   CALCIUM  8.6 (L) 04/27/2021 0147   PROT 5.0 (L) 04/27/2021 0147   ALBUMIN 2.2 (L) 04/27/2021 0147   AST 15 04/27/2021 0147   ALT 10 04/27/2021 0147   ALKPHOS 48 04/27/2021 0147   BILITOT 0.6 04/27/2021 0147   GFRNONAA 27 (L) 04/27/2021 0147   GFRAA 40 (L) 09/09/2020 1617   Lipase     Component Value Date/Time   LIPASE 42 04/10/2021 1438       Studies/Results: DG Abd Portable 1V  Result Date: 04/26/2021 CLINICAL DATA:  Weakness and fatigue. Evaluate for small bowel obstruction. EXAM: PORTABLE ABDOMEN - 1 VIEW COMPARISON:  Yesterday FINDINGS: Nasogastric tube terminates in the stomach with side port just above the gastroesophageal junction, similar. On both supine views, no gross free intraperitoneal air is seen. Contrast within normal caliber colon. Gas-filled small bowel loops are upper normal caliber, without focal transition identified. IMPRESSION: Upper normal gas-filled small bowel loops, favoring adynamic ileus. Further passage of contrast into the distal colon. Electronically Signed   By: Abigail Miyamoto M.D.   On: 04/26/2021 10:50    Anti-infectives: Anti-infectives (From admission, onward)    None        Assessment/Plan SBO - SBO resolving, likely has ileus CT scan with findings compatible with mid small-bowel obstruction.  Transition point is seen in the mid abdomen. No free air or  pneumatosis. - h/o of ex lap  - xray 9/10 with Upper normal gas-filled small bowel loops, favoring adynamic ileus. Further passage of contrast into the distal colon. - passing flatus and very small BM 2 days ago but still with high NG output (2775 ml last 24 H.) - hgb 6.3 and prbcs ordered per primary - keep NGT to LIWS today - recommend PICC/TPN today - keep K>4 and Mg > 2 for bowel function  Given his high/dark NG output and anemia requiring transfusion, I am concerned about upper GI bleeding. Recommend GI consult. Ordered protonix IV and recommend holding heparin subq   FEN -  NPO/NGT/IVFs VTE - hold plavix, on heparin subq ID - none   Acute on chronic CKD CHF DM CVAs Prostate cancer HTN Urinary incontinence    LOS: 17 days    Winferd Humphrey, [facility] Surgery 04/27/2021, 8:26 AM Please see Amion for pager number during day hours 7:00am-4:30pm

## 2021-04-27 NOTE — Progress Notes (Addendum)
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  X-ray of the abdomen showed some ileus-like features.  General surgery was consulted.  Patient undergoing conservative treatment at this time.  Assessment/Plan:  Partial small bowel obstruction likely ileus General surgery on board.  On conservative treatment.  Continue IV fluids, n.p.o., NG tube with intermittent suction.  CT scan shows transition point.  Small bowel follow-through shows passage of contrast in the distal colon.   Surgery has seen the patient today.  Recommend PICC line placement for TPN.  Acute blood loss anemia secondary to upper GI bleed.  On NG tube.  Has been having dark output today.  Hemoglobin drop has been noted today.  Will transfuse.  Discontinue heparin subcu.  We will consult GI.  Continue IV Protonix twice daily.  Continue n.p.o. except ice chips.  CKD stage IIIb.  Lasix on hold.  Has been started on D5 water with IV KCl due to hyperkalemia.  Continue to monitor closely.  Generalized weakness with ambulatory dysfunction.  Physical therapy has recommended skilled nursing facility placement.  Patient will follow-up with neurosurgery as outpatient.    Urinary retention/ incontinence History of prostate cancer status post radiation.  Continue supportive care.  External urinary catheter in place.  Elevated troponin likely secondary to  demand ischemia and acute kidney injury.  No acute issues.  Congestive heart failure:   Lasix on hold.  Overall - for 5401 mL. Continue  D5 water with KCl@100  mL/h for now.  History of prostate cancer.  Status post radiation treatment.  Follows up with urology as outpatient.   Bradycardia.   Improved at this time.  EKG showing PVCs and bigeminy pattern.  We will continue to monitor.  Currently on IV metoprolol due to n.p.o. status.  Blood pressure seems to be stable  History of stroke.  On aspirin and Plavix at home.  Currently NPO.  Continue to hold due to n.p.o. status   Hypokalemia.  We will replenish potassium through IV at this time.  On D5 water with KCl.  Hypernatremia.  Patient has been started on D5 water with KCl at this time.  Will monitor sodium levels.  Sodium still at 154.  Code Status: Full  Family Communication:  Spoke with the spouse on 04/26/2021   Disposition Plan:   Status is: Inpatient   Dispo: The patient is from: Home              Anticipated d/c is to: Skilled nursing facility.              Anticipated d/c date is: Unknown at this time, .              Patient currently not medically stable for discharge  Consultants: General surgery GI will consult  Procedures: NG tube placement  Antimicrobials: None  DVT prophylaxis: Heparin subcu  Subjective Today, patient was seen and examined at bedside. Patient denies overt pain but feels sensitive on the abdomen, unsure of bowel movements but no nausea or vomiting.. NG with dark fluid, drop in hemoglobin noted.  Objective: Vitals:   04/26/21 2008 04/27/21 0403  04/27/21 0412 04/27/21 0810  BP: 119/79 (!) 159/96  117/67  Pulse: (!) 103 91  100  Resp: 16 16    Temp: 99.9 F (37.7 C) (!) 97.5 F (36.4 C)  98.8 F (37.1 C)  TempSrc: Oral Oral  Oral  SpO2: 98% 94%  94%  Weight:   89.3 kg   Height:        Intake/Output Summary (Last 24 hours) at 04/27/2021 0904 Last data filed at 04/27/2021 0413 Gross per 24 hour  Intake 2022.38 ml  Output 3275 ml  Net -1252.62 ml    Filed Weights   04/26/21 0323 04/26/21 0500 04/27/21 0412   Weight: 90.4 kg (P) 90.4 kg 89.3 kg   Body mass index is 25.97 kg/m.  Physical exam: General:  Average built, not in obvious distress, NG tube in place with dark fluid, HENT:   No scleral pallor or icterus noted. Oral mucosa is dry Chest:   Diminished breath sounds bilaterally.  CVS: S1 &S2 heard. No murmur.  Regular rate and rhythm. Abdomen: Soft, nontender, nondistended.  Bowel sounds are heard.   Extremities: No cyanosis, clubbing or edema.  Peripheral pulses are palpable. Psych: Alert, awake and communicative normal mood CNS:  No cranial nerve deficits.  Power equal in all extremities.   Skin: Warm and dry.  No rashes noted.  Data Reviewed:  CBC: Recent Labs  Lab 04/23/21 0154 04/24/21 0330 04/25/21 0357 04/26/21 0335 04/27/21 0147  WBC 5.8 5.5 7.3 7.7 7.6  HGB 7.9* 7.7* 7.7* 7.3* 6.3*  HCT 23.8* 23.7* 23.7* 23.4* 20.5*  MCV 97.1 97.1 99.2 101.3* 101.5*  PLT 167 177 188 188 010    Basic Metabolic Panel: Recent Labs  Lab 04/22/21 0341 04/23/21 0154 04/24/21 0330 04/25/21 0357 04/26/21 0335 04/27/21 0147  NA 141 138 143 148* 153* 154*  K 3.9 3.7 3.6 3.8 3.3* 3.2*  CL 108 106 107 112* 115* 110  CO2 26 26 27 25 29  34*  GLUCOSE 119* 124* 113* 124* 121* 187*  BUN 40* 37* 38* 53* 50* 52*  CREATININE 2.54* 2.40* 2.14* 2.04* 2.15* 2.38*  CALCIUM 8.8* 8.6* 8.8* 8.6* 8.9 8.6*  MG 2.1 2.0 2.2 2.3 2.5* 2.5*  PHOS 4.1 3.5 3.4 2.9  --   --     GFR: Estimated Creatinine Clearance: 27.5 mL/min (A) (by C-G formula based on SCr of 2.38 mg/dL (H)). Liver Function Tests: Recent Labs  Lab 04/23/21 0154 04/27/21 0147  AST 13* 15  ALT 11 10  ALKPHOS 69 48  BILITOT 0.5 0.6  PROT 5.2* 5.0*  ALBUMIN 2.4* 2.2*    No results for input(s): LIPASE, AMYLASE in the last 168 hours. No results for input(s): AMMONIA in the last 168 hours. Coagulation Profile: No results for input(s): INR, PROTIME in the last 168 hours. Cardiac Enzymes: No results for input(s): CKTOTAL,  CKMB, CKMBINDEX, TROPONINI in the last 168 hours. BNP (last 3 results) No results for input(s): PROBNP in the last 8760 hours. HbA1C: No results for input(s): HGBA1C in the last 72 hours. CBG: Recent Labs  Lab 04/26/21 0549 04/26/21 1120 04/26/21 1629 04/26/21 2054 04/27/21 0552  GLUCAP 120* 128* 144* 163* 175*    Lipid Profile: No results for input(s): CHOL, HDL, LDLCALC, TRIG, CHOLHDL, LDLDIRECT in the last 72 hours. Thyroid Function Tests: No results for input(s): TSH, T4TOTAL, FREET4, T3FREE, THYROIDAB in the last 72 hours. Anemia Panel: No results for input(s): VITAMINB12, FOLATE, FERRITIN, TIBC, IRON, RETICCTPCT in the last 72 hours. Urine  analysis:    Component Value Date/Time   COLORURINE YELLOW 04/11/2021 0814   APPEARANCEUR HAZY (A) 04/11/2021 0814   APPEARANCEUR Clear 01/01/2021 1448   LABSPEC 1.010 04/11/2021 0814   PHURINE 6.0 04/11/2021 0814   GLUCOSEU NEGATIVE 04/11/2021 0814   HGBUR NEGATIVE 04/11/2021 0814   BILIRUBINUR NEGATIVE 04/11/2021 0814   BILIRUBINUR Negative 01/01/2021 [place] 04/11/2021 0814   PROTEINUR NEGATIVE 04/11/2021 0814   NITRITE NEGATIVE 04/11/2021 0814   LEUKOCYTESUR NEGATIVE 04/11/2021 0814   Sepsis Labs: @LABRCNTIP (procalcitonin:4,lacticidven:4)  ) No results found for this or any previous visit (from the past 240 hour(s)).    Studies: No results found.  Scheduled Meds:  sodium chloride   Intravenous Once   acetaminophen  650 mg Rectal Once   diphenhydrAMINE  25 mg Intravenous Once   heparin  5,000 Units Subcutaneous Q12H   insulin aspart  0-9 Units Subcutaneous TID WC   metoprolol tartrate  2.5 mg Intravenous Q6H   tamsulosin  0.8 mg Oral QHS    Continuous Infusions:  dextrose 5 % with KCl 20 mEq / L 20 mEq (04/27/21 0629)   promethazine (PHENERGAN) injection (IM or IVPB) Stopped (04/21/21 1930)     LOS: 17 days    Flora Lipps, MD [facility] 04/27/2021, 9:04 AM

## 2021-04-27 NOTE — Consult Note (Addendum)
Referring Provider: Dr. Flora Lipps  Primary Care Physician:  Monico Blitz, MD Primary Gastroenterologist: Althia Forts  Reason for Consultation: Bloody NG tube output, anemia   HPI: Thomas Duarte is a 80 y.o. 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  Past exploratory laparotomy with Meckel's diverticulectomy.  He was admitted to the hospital with lower extremity weakness with difficulty ambulating 04/10/2021.  He appeared hypovolemic with AKI on CKD.  He was treated with IV fluids and diuretics were held and his kidney function improved.  Due to his lower extremity weakness lumbar MRI showed evidence of spinal stenosis. Eventual neurosurgery follow-up as an outpatient. He developed nausea and vomiting on 04/21/2021. Abdominal x-ray showed moderately dilated small bowel loops throughout the central abdomen and mid gastric distention concerning for distal small bowel obstruction.  An NG tube was placed.  CTAP 9/6 findings consistent with a small bowel obstruction without evidence of free air or pneumatosis.  General surgery was consulted and conservative treatment was recommended to continue NG tube to LIS.  Surgical intervention was deferred.  He developed dark brown bilious bloody drainage from his NG tube and his hemoglobin dropped from 7.3 down to 6.3 early this morning.  His admission hemoglobin level was 9.0.  He was transfused 1 unit of PRBCs which just completed. Posttransfusion H and H not yet drawn.  His last dose of Plavix was 04/09/2021.  A GI consult was requested for further evaluation regarding upper GI bleed.  The patient is quite fatigued at this time and he is a poor historian.  No family at the bedside.  His RN is present and the  patient recently pulled out his NG tube.  There is approximately 800 cc of bloody brown drainage in the collection container.  Denies having any chest pain, palpitations or shortness of breath.  He denies any prior history of GERD or peptic ulcer disease.  No known prior history of any GI bleeding.  His Epic history documents past expiratory laparotomy/Meckel's diverticulectomy, further details are unclear.  He does not recall if he is ever had an EGD or colonoscopy in his lifetime.  He possibly passed a small bowel movement earlier today.  His last documented BM in epic was 9/3 which was a formed brown stool.  No rectal bleeding or melena observed per the nursing staff today.  No family at the bedside.  He lives with his wife.  CTAP 04/22/2021 without contrast: 1. Findings compatible with mid small-bowel obstruction. Transition point is seen in the mid abdomen. No free air or pneumatosis. 2. Nasogastric tube tip in the mid stomach. 3. Prostate radiotherapy seeds. 4. Small bilateral pleural effusions.    Past Medical History:  Diagnosis Date   BPH (benign prostatic hyperplasia)    Diabetes mellitus without complication ([place])    Hypercholesteremia    Hypertension    Stroke ([place]) 2019   x2; numbness of right thumb and pointer and middle finger, no other deficits    Past Surgical History:  Procedure Laterality Date   CATARACT EXTRACTION W/PHACO Left 10/21/2018   Procedure: CATARACT EXTRACTION PHACO AND INTRAOCULAR LENS PLACEMENT LEFT EYE;  Surgeon: Baruch Goldmann, MD;  Location: [facility];  Service: Ophthalmology;  Laterality: Left;  CDE: 5.18   CATARACT EXTRACTION W/PHACO Right 06/23/2019  Procedure: CATARACT EXTRACTION PHACO AND INTRAOCULAR LENS PLACEMENT (IOC);  Surgeon: Baruch Goldmann, MD;  Location: [facility];  Service: Ophthalmology;  Laterality: Right;  CDE: 15.0   EXPLORATORY LAPAROTOMY     LOA, 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 requests time 1:00   HERNIA REPAIR Left 1960   Inguinal   IR GENERIC HISTORICAL  04/28/2016   IR RADIOLOGIST EVAL & MGMT 04/28/2016 [facility] RAD   SPACE OAR INSTILLATION N/A 12/16/2020   Procedure: SPACE OAR INSTILLATION;  Surgeon: Cleon Gustin, MD;  Location: [facility];  Service: Urology;  Laterality: N/A;   TONSILLECTOMY  1952   adenoidectomy    Prior to Admission medications   Medication Sig Start Date End Date Taking? Authorizing Provider  aspirin 325 MG tablet Take 325 mg by mouth daily.   Yes [provider]  clopidogrel (PLAVIX) 75 MG tablet Take 75 mg by mouth at bedtime.   Yes [provider]  Glycerin-Hypromellose-PEG 400 0.2-0.2-1 % SOLN Place 1 drop into both eyes 3 (three) times daily as needed (dry eyes).   Yes [provider]  ibuprofen (ADVIL) 200 MG tablet Take 400 mg by mouth at bedtime as needed for mild pain.   Yes [provider]  KLOR-CON M10 10 MEQ tablet Take 10 mEq by mouth 2 (two) times daily. 05/03/20  Yes [provider]  lisinopril (PRINIVIL,ZESTRIL) 40 MG tablet Take 40 mg by mouth daily.   Yes [provider]  metFORMIN (GLUCOPHAGE) 500 MG tablet Take 500 mg by mouth daily with breakfast.   Yes [provider]  metoprolol succinate (TOPROL-XL) 25 MG 24 hr tablet Take 25 mg by mouth daily. 05/09/20  Yes [provider]  OVER THE COUNTER MEDICATION Apply 1 application topically 2 (two) times daily as needed (pain). Real Time Pain Relief   Yes [provider]  tamsulosin (FLOMAX) 0.4 MG CAPS capsule Take 0.8 mg by mouth at bedtime.    Yes [provider]  tobramycin (TOBREX) 0.3 % ophthalmic solution Place 1 drop into both eyes every 4 (four) hours as needed (after eye injections until healed).   Yes [provider]  amLODipine  (NORVASC) 10 MG tablet Take 1 tablet (10 mg total) by mouth daily. 04/16/21   Shelly Coss, MD  furosemide (LASIX) 40 MG tablet Take 1 tablet (40 mg total) by mouth daily. 04/16/21   Shelly Coss, MD  Lancets [facility] DELICA PLUS [id number]) [place] SMARTSIG:1 Strip(s) Topical Daily 08/29/20   [provider]  [facility] VERIO test strip 1 each daily. 10/03/20   [provider]  sodium bicarbonate 650 MG tablet Take 1 tablet (650 mg total) by mouth 3 (three) times daily. 04/16/21   Shelly Coss, MD    Current Facility-Administered Medications  Medication Dose Route Frequency Provider Last Rate Last Admin   acetaminophen (TYLENOL) suppository 650 mg  650 mg Rectal Q4H PRN Pokhrel, Laxman, MD       acetaminophen (TYLENOL) suppository 650 mg  650 mg Rectal Once Chotiner, Yevonne Aline, MD       dextrose 5 % with KCl 20 mEq / L  infusion  20 mEq Intravenous Continuous Pokhrel, Laxman, MD 100 mL/hr at 04/27/21 0629 20 mEq at 04/27/21 0629   diphenhydrAMINE (BENADRYL) injection 25 mg  25 mg Intravenous Once Chotiner,  Yevonne Aline, MD       hydrALAZINE (APRESOLINE) injection 10 mg  10 mg Intravenous Q6H PRN Pokhrel, Laxman, MD   10 mg at 04/26/21 0405   insulin aspart (novoLOG) injection 0-9 Units  0-9 Units Subcutaneous TID WC Lequita Halt, MD   2 Units at 04/27/21 1335   metoprolol tartrate (LOPRESSOR) injection 2.5 mg  2.5 mg Intravenous Q6H Pokhrel, Laxman, MD   2.5 mg at 04/27/21 0438   ondansetron (ZOFRAN) tablet 4 mg  4 mg Oral Q6H PRN Lequita Halt, MD       Or   ondansetron [facility]) injection 4 mg  4 mg Intravenous Q6H PRN Wynetta Fines T, MD   4 mg at 04/25/21 1427   pantoprazole (PROTONIX) injection 40 mg  40 mg Intravenous Q24H Richard Miu H, PA-C       polyvinyl alcohol (LIQUIFILM TEARS) 1.4 % ophthalmic solution 1 drop  1 drop Both Eyes PRN Heloise Purpura, RPH   1 drop at 04/19/21 2118   promethazine (PHENERGAN) 12.5 mg in sodium chloride 0.9 % 50 mL IVPB  12.5 mg  Intravenous Q6H PRN Pokhrel, Laxman, MD   Stopping Infusion hung by another clincian at 04/21/21 1930   tamsulosin (FLOMAX) capsule 0.8 mg  0.8 mg Oral QHS Wynetta Fines T, MD   0.8 mg at 04/21/21 2228   tobramycin (TOBREX) 0.3 % ophthalmic solution 1 drop  1 drop Both Eyes Q4H PRN Shelly Coss, MD   1 drop at 04/16/21 1052    Allergies as of 04/10/2021   (No Known Allergies)    History reviewed. No pertinent family history.  Social History   Socioeconomic History   Marital status: Married    Spouse name: Not on file   Number of children: 1   Years of education: Not on file   Highest education level: Not on file  Occupational History   Occupation: retired  Tobacco Use   Smoking status: Never   Smokeless tobacco: Never  Vaping Use   Vaping Use: Never used  Substance and Sexual Activity   Alcohol use: Never   Drug use: Never   Sexual activity: Yes    Birth control/protection: None  Other Topics Concern   Not on file  Social History Narrative   Not on file   Social Determinants of Health   Financial Resource Strain: Not on file  Food Insecurity: Not on file  Transportation Needs: Not on file  Physical Activity: Not on file  Stress: Not on file  Social Connections: Not on file  Intimate Partner Violence: Not on file    Review of Systems: See HPI, all other systems reviewed and are negative Physical Exam:  Vital signs in last 24 hours: Temp:  [97.5 F (36.4 C)-99.9 F (37.7 C)] 97.9 F (36.6 C) (09/11 1210) Pulse Rate:  [87-103] 88 (09/11 1210) Resp:  [16-20] 16 (09/11 1210) BP: (117-159)/(64-96) 121/79 (09/11 1210) SpO2:  [94 %-98 %] 97 % (09/11 1132) Weight:  [89.3 kg] 89.3 kg (09/11 0412) Last BM Date: 04/25/21 General: Ill-appearing 80 year old male in no acute distress. Head:  Normocephalic and atraumatic. Eyes:  No scleral icterus. Conjunctiva pink. Ears:  Normal auditory acuity. Nose:  No deformity, discharge or lesions. Mouth: Poor dentition.  No  ulcers or lesions.  Bloody drainage around mouth. Neck:  Supple. No lymphadenopathy or thyromegaly.  Lungs: Breath sounds clear, diminished in the bases. Heart: S1, S2.  Regular rhythm.  No murmurs. Abdomen: Soft, nontender.  Positive  bowel sounds to all 4 quadrants. Rectal: Deferred. Musculoskeletal:  Symmetrical without gross deformities.  Pulses:  Normal pulses noted. Extremities:  Without clubbing or edema. Neurologic:  Alert and  oriented x4. No focal deficits.  Skin:  Intact without significant lesions or rashes. Psych:  Alert and cooperative. Normal mood and affect.  Intake/Output from previous day: 09/10 0701 - 09/11 0700 In: 2022.4 [P.O.:330; I.V.:1692.4] Out: 0175 [Urine:650; Emesis/NG ZWCHEN:2778] Intake/Output this shift: No intake/output data recorded.  Lab Results: Recent Labs    04/25/21 0357 04/26/21 0335 04/27/21 0147  WBC 7.3 7.7 7.6  HGB 7.7* 7.3* 6.3*  HCT 23.7* 23.4* 20.5*  PLT 188 188 194   BMET Recent Labs    04/25/21 0357 04/26/21 0335 04/27/21 0147  NA 148* 153* 154*  K 3.8 3.3* 3.2*  CL 112* 115* 110  CO2 25 29 34*  GLUCOSE 124* 121* 187*  BUN 53* 50* 52*  CREATININE 2.04* 2.15* 2.38*  CALCIUM 8.6* 8.9 8.6*   LFT Recent Labs    04/27/21 0147  PROT 5.0*  ALBUMIN 2.2*  AST 15  ALT 10  ALKPHOS 48  BILITOT 0.6   PT/INR No results for input(s): LABPROT, INR in the last 72 hours. Hepatitis Panel No results for input(s): HEPBSAG, HCVAB, HEPAIGM, HEPBIGM in the last 72 hours.    Studies/Results: DG Abd Portable 1V  Result Date: 04/26/2021 CLINICAL DATA:  Weakness and fatigue. Evaluate for small bowel obstruction. EXAM: PORTABLE ABDOMEN - 1 VIEW COMPARISON:  Yesterday FINDINGS: Nasogastric tube terminates in the stomach with side port just above the gastroesophageal junction, similar. On both supine views, no gross free intraperitoneal air is seen. Contrast within normal caliber colon. Gas-filled small bowel loops are upper normal  caliber, without focal transition identified. IMPRESSION: Upper normal gas-filled small bowel loops, favoring adynamic ileus. Further passage of contrast into the distal colon. Electronically Signed   By: Abigail Miyamoto M.D.   On: 04/26/2021 10:50   [place] EKG SITE RITE  Result Date: 04/27/2021 If Site Rite image not attached, placement could not be confirmed due to current cardiac rhythm.   IMPRESSION/PLAN:  75) 80 year old male with a partial small bowel obstruction.  CTAP 9/6 findings consistent with limited small bowel obstruction with a transition point seen in the mid abdomen without free air or pneumatosis. He was evaluated by general surgery who recommended conservative management, IV fluids and NG tube to low intermittent suction.  Abdominal x-ray 9/10  Upper normal gas-filled small bowel loops, favoring adynamic ileus with further passage of contrast into the distal colon. He developed 800cc dark brown bilious bloody output from his NG tube today and his Hg dropped from 7.3 -> 6.3.  Transfused 1 unit of PRBCs. Heparin SQ was discontinued.  Last dose of Plavix was 8/24. -NPO -Await posttransfusion H&H result -Pantoprazole 40 mg IV x1 followed by 8 mg/hour infusion -Transfuse to maintain hemoglobin level greater than 8 -RN to replace NG tube, check abdominal x-ray for NG tube placement -Consider EGD 04/28/2021, further recommendations per Dr. Lyndel Safe -General surgery considering PICC line with TPN  2) Chronic anemia. Admission Hg 9.0.   3) CKD stage III. Cr 2.15 -> 2.38  4) Dilated cardiomyopathy, CHF  5) History of CVA. Plavix and ASA discontinued  6) History of prostate cancer   Noralyn Pick  04/27/2021, 2:24 PM     Attending physician's note   I have taken an interval history, reviewed the chart and examined the patient. I agree with the Advanced  Practitioner's note, impression and recommendations.   PSBO with NG tube, starting having UGI bleed. Hb 7.3 to 6.3, just  finished 1U PRBC. HD stable. NG tube with blood. Anemia of chronic disease CKD 3 H/O CVA on ASA/plavix (last dose 7/24)  Plan: -Transfuse another unit of PRBC -IV Protonix drip. -Hold SQ heparin. -Hb/Hct Q8hrs x 3.  Please have 2U PRBC on hold at all times x next 24 hours. -DDAVP pharmacy to dose. -If significant bleeding/becomes HD unstable, transfer to ICU.  Then, emergent EGD or CTA (has CKD).  I will alert GI on call tonight. -Otherwise, reassess in AM.  Likely will need EGD after NG lavage in AM.  Dr. Lorenso Courier taking over GI service tomorrow.  She is aware of him.   Carmell Austria, MD [facility] [phone]

## 2021-04-27 NOTE — Progress Notes (Signed)
Peripherally Inserted Central Catheter Placement  The IV Nurse has discussed with the patient and/or persons authorized to consent for the patient, the purpose of this procedure and the potential benefits and risks involved with this procedure.  The benefits include less needle sticks, lab draws from the catheter, and the patient may be discharged home with the catheter. Risks include, but not limited to, infection, bleeding, blood clot (thrombus formation), and puncture of an artery; nerve damage and irregular heartbeat and possibility to perform a PICC exchange if needed/ordered by physician.  Alternatives to this procedure were also discussed.  Bard Power PICC patient education guide, fact sheet on infection prevention and patient information card has been provided to patient /or left at bedside.    PICC Placement Documentation  PICC Double Lumen 04/27/21 PICC Right Cephalic 42 cm 0 cm (Active)  Indication for Insertion or Continuance of Line Administration of hyperosmolar/irritating solutions (i.e. TPN, Vancomycin, etc.) 04/27/21 1749  Exposed Catheter (cm) 0 cm 04/27/21 1749  Site Assessment Clean;Dry;Intact 04/27/21 1749  Lumen #1 Status Flushed;Saline locked;Blood return noted 04/27/21 1749  Lumen #2 Status Flushed;Saline locked;Blood return noted 04/27/21 1749  Dressing Type Transparent 04/27/21 1749  Dressing Status Clean;Dry;Intact 04/27/21 1749  Antimicrobial disc in place? Yes 04/27/21 1749  Safety Lock Not Applicable 93/55/21 7471  Line Care Connections checked and tightened;Lumen 1 tubing changed;Lumen 1 cap changed 04/27/21 1749  Line Adjustment (NICU/IV Team Only) No 04/27/21 1749  Dressing Intervention New dressing 04/27/21 5953  Dressing Change Due 05/04/21 04/27/21 1749       Rolena Infante 04/27/2021, 5:50 PM

## 2021-04-27 NOTE — Progress Notes (Signed)
NG tube not in patient nose. Pr reports it "fell out of my nose.".  Tube was present on last round.  Will inform provider.   Pt blood admin completed.

## 2021-04-27 NOTE — Progress Notes (Signed)
Pharmacy Consult Re: DDAVP - Asked to dose DDAVP x 1 for GIB.  Entered DDAVP 20 mcg x 1 dose.  Pharmacy to follow peripherally.  [place] Crane, Roylene Reason, BCCP Clinical Pharmacist  04/27/2021 4:04 PM   [facility] pharmacy phone numbers are listed on [email or web address]

## 2021-04-28 ENCOUNTER — Inpatient Hospital Stay (HOSPITAL_COMMUNITY): Payer: Medicare HMO | Admitting: Anesthesiology

## 2021-04-28 ENCOUNTER — Encounter (HOSPITAL_COMMUNITY)
Admission: EM | Disposition: A | Discharge status: Skilled Nursing Facility | Payer: Self-pay | Source: Home / Self Care | Attending: Internal Medicine

## 2021-04-28 DIAGNOSIS — K922 Gastrointestinal hemorrhage, unspecified: Secondary | ICD-10-CM

## 2021-04-28 DIAGNOSIS — Z4659 Encounter for fitting and adjustment of other gastrointestinal appliance and device: Secondary | ICD-10-CM

## 2021-04-28 DIAGNOSIS — K567 Ileus, unspecified: Secondary | ICD-10-CM

## 2021-04-28 DIAGNOSIS — K259 Gastric ulcer, unspecified as acute or chronic, without hemorrhage or perforation: Secondary | ICD-10-CM

## 2021-04-28 DIAGNOSIS — K209 Esophagitis, unspecified without bleeding: Secondary | ICD-10-CM | POA: Diagnosis not present

## 2021-04-28 DIAGNOSIS — R972 Elevated prostate specific antigen [PSA]: Secondary | ICD-10-CM

## 2021-04-28 DIAGNOSIS — Z0189 Encounter for other specified special examinations: Secondary | ICD-10-CM

## 2021-04-28 DIAGNOSIS — K2970 Gastritis, unspecified, without bleeding: Secondary | ICD-10-CM

## 2021-04-28 DIAGNOSIS — R109 Unspecified abdominal pain: Secondary | ICD-10-CM

## 2021-04-28 HISTORY — PX: ESOPHAGOGASTRODUODENOSCOPY (EGD) WITH PROPOFOL: SHX5813

## 2021-04-28 LAB — BASIC METABOLIC PANEL
Anion gap: 10 (ref 5–15)
BUN: 48 mg/dL — ABNORMAL HIGH (ref 8–23)
CO2: 36 mmol/L — ABNORMAL HIGH (ref 22–32)
Calcium: 7.8 mg/dL — ABNORMAL LOW (ref 8.9–10.3)
Chloride: 105 mmol/L (ref 98–111)
Creatinine, Ser: 2.68 mg/dL — ABNORMAL HIGH (ref 0.61–1.24)
GFR, Estimated: 23 mL/min — ABNORMAL LOW (ref >60)
Glucose, Bld: 124 mg/dL — ABNORMAL HIGH (ref 70–99)
Potassium: 3 mmol/L — ABNORMAL LOW (ref 3.5–5.1)
Sodium: 151 mmol/L — ABNORMAL HIGH (ref 135–145)

## 2021-04-28 LAB — COMPREHENSIVE METABOLIC PANEL
ALT: 8 U/L (ref 0–44)
AST: 13 U/L — ABNORMAL LOW (ref 15–41)
Albumin: 1.9 g/dL — ABNORMAL LOW (ref 3.5–5.0)
Alkaline Phosphatase: 44 U/L (ref 38–126)
Anion gap: 4 — ABNORMAL LOW (ref 5–15)
BUN: 52 mg/dL — ABNORMAL HIGH (ref 8–23)
CO2: 36 mmol/L — ABNORMAL HIGH (ref 22–32)
Calcium: 7.8 mg/dL — ABNORMAL LOW (ref 8.9–10.3)
Chloride: 102 mmol/L (ref 98–111)
Creatinine, Ser: 2.72 mg/dL — ABNORMAL HIGH (ref 0.61–1.24)
GFR, Estimated: 23 mL/min — ABNORMAL LOW (ref >60)
Glucose, Bld: 476 mg/dL — ABNORMAL HIGH (ref 70–99)
Potassium: 4.7 mmol/L (ref 3.5–5.1)
Sodium: 142 mmol/L (ref 135–145)
Total Bilirubin: 0.7 mg/dL (ref 0.3–1.2)
Total Protein: 4.3 g/dL — ABNORMAL LOW (ref 6.5–8.1)

## 2021-04-28 LAB — GLUCOSE, CAPILLARY
Glucose-Capillary: 158 mg/dL — ABNORMAL HIGH (ref 70–99)
Glucose-Capillary: 129 mg/dL — ABNORMAL HIGH (ref 70–99)
Glucose-Capillary: 97 mg/dL (ref 70–99)
Glucose-Capillary: 204 mg/dL — ABNORMAL HIGH (ref 70–99)

## 2021-04-28 LAB — CBC
HCT: 21.5 % — ABNORMAL LOW (ref 39.0–52.0)
Hemoglobin: 6.7 g/dL — CL (ref 13.0–17.0)
MCH: 30.2 pg (ref 26.0–34.0)
MCHC: 31.2 g/dL (ref 30.0–36.0)
MCV: 96.8 fL (ref 80.0–100.0)
Platelets: 124 10*3/uL — ABNORMAL LOW (ref 150–400)
RBC: 2.22 MIL/uL — ABNORMAL LOW (ref 4.22–5.81)
RDW: 18.3 % — ABNORMAL HIGH (ref 11.5–15.5)
WBC: 5.7 10*3/uL (ref 4.0–10.5)
nRBC: 0 % (ref 0.0–0.2)

## 2021-04-28 LAB — HEMOGLOBIN AND HEMATOCRIT, BLOOD
HCT: 28.9 % — ABNORMAL LOW (ref 39.0–52.0)
Hemoglobin: 9.5 g/dL — ABNORMAL LOW (ref 13.0–17.0)

## 2021-04-28 LAB — MAGNESIUM: Magnesium: 2.3 mg/dL (ref 1.7–2.4)

## 2021-04-28 LAB — TRIGLYCERIDES: Triglycerides: 141 mg/dL (ref <150)

## 2021-04-28 LAB — PHOSPHORUS: Phosphorus: 3.6 mg/dL (ref 2.5–4.6)

## 2021-04-28 LAB — PREPARE RBC (CROSSMATCH)

## 2021-04-28 SURGERY — ESOPHAGOGASTRODUODENOSCOPY (EGD) WITH PROPOFOL
Anesthesia: General

## 2021-04-28 MED ORDER — PHENYLEPHRINE HCL (PRESSORS) 10 MG/ML IV SOLN
INTRAVENOUS | Status: DC | PRN
  Administered 2021-04-28: 80 ug via INTRAVENOUS

## 2021-04-28 MED ORDER — SUCCINYLCHOLINE CHLORIDE 200 MG/10ML IV SOSY
PREFILLED_SYRINGE | INTRAVENOUS | Status: DC | PRN
  Administered 2021-04-28: 100 mg via INTRAVENOUS

## 2021-04-28 MED ORDER — SODIUM CHLORIDE 0.9% IV SOLUTION: Freq: Once | INTRAVENOUS | Status: AC

## 2021-04-28 MED ORDER — LACTATED RINGERS IV SOLN: INTRAVENOUS | Status: DC | PRN

## 2021-04-28 MED ORDER — PROPOFOL 10 MG/ML IV BOLUS
INTRAVENOUS | Status: DC | PRN
  Administered 2021-04-28: 100 mg via INTRAVENOUS

## 2021-04-28 MED ORDER — DEXAMETHASONE SODIUM PHOSPHATE 10 MG/ML IJ SOLN
INTRAMUSCULAR | Status: DC | PRN
  Administered 2021-04-28: 10 mg via INTRAVENOUS

## 2021-04-28 MED ORDER — DEXTROSE 5 % IV SOLN: INTRAVENOUS | Status: DC

## 2021-04-28 MED ORDER — SODIUM CHLORIDE 0.9 % IV SOLN: INTRAVENOUS | Status: DC

## 2021-04-28 MED ORDER — SODIUM CHLORIDE 0.45 % IV SOLN: INTRAVENOUS | Status: DC

## 2021-04-28 MED ORDER — POTASSIUM CHLORIDE 10 MEQ/50ML IV SOLN
10.0000 meq | INTRAVENOUS | Status: AC
Start: 2021-04-28 — End: 2021-04-28
  Administered 2021-04-28 (×4): 10 meq via INTRAVENOUS
  Filled 2021-04-28 (×4): qty 50

## 2021-04-28 MED ORDER — FENTANYL CITRATE (PF) 100 MCG/2ML IJ SOLN
INTRAMUSCULAR | Status: DC | PRN
  Administered 2021-04-28 (×2): 50 ug via INTRAVENOUS

## 2021-04-28 MED ORDER — BOOST / RESOURCE BREEZE PO LIQD CUSTOM
1.0000 | Freq: Two times a day (BID) | ORAL | Status: DC
  Administered 2021-04-29 – 2021-04-30 (×3): 1 via ORAL

## 2021-04-28 MED ORDER — ACETAMINOPHEN 650 MG RE SUPP
650.0000 mg | Freq: Once | RECTAL | Status: AC
  Administered 2021-04-28: 650 mg via RECTAL
  Filled 2021-04-28: qty 1

## 2021-04-28 MED ORDER — DIPHENHYDRAMINE HCL 50 MG/ML IJ SOLN
25.0000 mg | Freq: Once | INTRAMUSCULAR | Status: AC
  Administered 2021-04-28: 25 mg via INTRAVENOUS
  Filled 2021-04-28: qty 1

## 2021-04-28 MED ORDER — TRACE MINERALS CU-MN-SE-ZN 300-55-60-3000 MCG/ML IV SOLN
INTRAVENOUS | Status: AC
  Filled 2021-04-28: qty 499.2

## 2021-04-28 MED ORDER — INSULIN ASPART 100 UNIT/ML IJ SOLN
0.0000 [IU] | INTRAMUSCULAR | Status: DC
  Administered 2021-04-28: 5 [IU] via SUBCUTANEOUS
  Administered 2021-04-29 (×2): 2 [IU] via SUBCUTANEOUS

## 2021-04-28 NOTE — Progress Notes (Signed)
PT Cancellation Note  Patient Details Name: Thomas Duarte MRN: [id number] DOB: 18-Apr-1941   Cancelled Treatment:    Reason Eval/Treat Not Completed: (P) Patient at procedure or test/unavailable Pt is off floor. PT will follow back for treatment this afternoon as able.   Doron Shake B. Migdalia Dk PT, DPT Acute Rehabilitation Services Pager [phone] Office [phone]    [place] 04/28/2021, 10:25 AM

## 2021-04-28 NOTE — Progress Notes (Signed)
Pt was found with NGT halfway out of his nose. NGT was present 15 minutes prior. Pt says he did not pull it out. Seems as though it was tugged on when pt was turning in bed. Pt is able to answer orientation questions. Tube was removed and replaced w/ air bubbles audible in stomach. MD aware. Orders for xray to verify placement.

## 2021-04-28 NOTE — Interval H&P Note (Signed)
History and Physical Interval Note:  04/28/2021 11:20 AM  Thomas Duarte  has presented today for surgery, with the diagnosis of Bloody NGT output.  The various methods of treatment have been discussed with the patient and family. After consideration of risks, benefits and other options for treatment, the patient has consented to  Procedure(s): ESOPHAGOGASTRODUODENOSCOPY (EGD) WITH PROPOFOL (N/A) as a surgical intervention.  The patient's history has been reviewed, patient examined, no change in status, stable for surgery.  I have reviewed the patient's chart and labs.  Questions were answered to the patient's satisfaction.     Sharyn Creamer

## 2021-04-28 NOTE — Progress Notes (Signed)
Xray resulted. MD aware. NGT in place, orders to restart intermittent suctioning. Suction restarted 0000

## 2021-04-28 NOTE — Progress Notes (Addendum)
PHARMACY - TOTAL PARENTERAL NUTRITION CONSULT NOTE   Indication: Prolonged ileus and Small bowel obstruction  Patient Measurements: Height: 6\' 1"  (185.4 cm) Weight: 88.9 kg (195 lb 15.8 oz) IBW/kg (Calculated) : 79.9 TPN AdjBW (KG): 94.9 Body mass index is 25.86 kg/m.  Assessment: 22 yom admitted 8/25 with SBO. Patient has flatus but no BM x 2 days, has high NG output, likely ileus per Surgery. Pharmacy consulted to start TPN.  Glucose / Insulin: A1c 7.1% (04/10/21). CBGs 114-159 (476 on bmet appears to be lab error). 3 units sSSI utilized in last 24hrs (prior to TPN start) Electrolytes: ?labs contaminated 9/12 - will repeat. [place]vous 9/11 labs - Na 154, K 3.2 (on D5W w/ KCl per MD), CO2 34, Mag 2.5, others WNL Renal: SCr up to 2.72, BUN stable 52 Hepatic: LFTs / Tbili / TG WNL, albumin 1.9 Intake / Output; MIVF: NG output 1070ml/24hrs, UOP not accurately charted; MIVF: D5+20K at 100 ml/hr GI Imaging: none since TPN start GI Surgeries / Procedures: none since TPN start  Central access: PICC placed 9/11 TPN start date: 9/12  Nutritional Goals: Goal TPN rate is 85 mL/hr (provides 106 g of protein and 2166 kcals per day)  RD Assessment: 2100-2300 kCal, 105-120g protein per RD 9/12  Current Nutrition:  NPO  Plan:  Start TPN at 41mL/hr at 1800. Titrate to goal as tolerated. Electrolytes in TPN: standard lytes except will start at 1/2 standard K/Mag due to AKI - Na 89mEq/L, K 67mEq/L, Ca 2mEq/L, Mg 58mEq/L, and Phos 28mmol/L. Cl:Ac 1:1 Add standard MVI and trace elements to TPN Intensify to Moderate q4h SSI and adjust as needed  MD adjusting IVF to 1/2NS at 50 ml/hr Monitor TPN labs on Mon/Thurs, f/u Surgery plans  *Labs appear to be contaminated this AM; however, due to patient actively receiving PRBC transfusion and off the floor for EGD, will be unable to obtain repeat prior to cut-off time for TPN ordering. MD making changes to MIVF orders and will reassess when repeat results  later today   Arturo Morton, PharmD, BCPS Please check AMION for all [place] contact numbers Clinical Pharmacist 04/28/2021 9:21 AM  ADDENDUM - repeat bmet (due to contaminated labs this AM) resulted with SCr up to 2.68 today, Na 151 (down), K 3 (down), CO2 high stable 36, others WNL  Dietitian nutritional goals adjusted since the initial goals given per discussion earlier today.  Estimated nutritional needs (per dietitian 9/12) Kcal - 6503-5465 Protein - 110-130g Fluid - >2.6L  Plan - Give K runs x 4 and f/u AM labs Will update TPN formula calculations to match adjusted dietitian goals with TPN orders tomorrow.   Arturo Morton, PharmD, BCPS Please check AMION for all [place] contact numbers Clinical Pharmacist 04/28/2021 4:20 PM

## 2021-04-28 NOTE — Op Note (Addendum)
[facility] Patient Name: Thomas Duarte Procedure Date : 04/28/2021 MRN: [id number] Attending MD: Georgian Co ,  Date of Birth: 1941-06-09 CSN: [id number] Age: 80 Admit Type: Inpatient Procedure:                Upper GI endoscopy Indications:              Suspected upper gastrointestinal bleeding, coffee                            ground NGT output Providers:                Adline Mango" Stasia Cavalier, RN, Cherylynn Ridges, Technician, Theodoro Grist, CRNA Referring MD:             Hospitalist Team Medicines:                Monitored Anesthesia Care Complications:            No immediate complications. Estimated Blood Loss:     Estimated blood loss was minimal. Procedure:                Pre-Anesthesia Assessment:                           - Prior to the procedure, a History and Physical                            was performed, and patient medications and                            allergies were reviewed. The patient is competent.                            The risks and benefits of the procedure and the                            sedation options and risks were discussed with the                            patient. All questions were answered and informed                            consent was obtained. Patient identification and                            proposed procedure were verified by the physician                            in the pre-procedure area. Prophylactic                            Antibiotics: The patient does not require  prophylactic antibiotics. Prior Anticoagulants: The                            patient has taken heparin, last dose was 1 day                            prior to procedure. ASA Grade Assessment: III - A                            patient with severe systemic disease. After                            reviewing the risks and benefits, the patient was                             deemed in satisfactory condition to undergo the                            procedure. The anesthesia plan was to use monitored                            anesthesia care (MAC). Immediately prior to                            administration of medications, the patient was                            re-assessed for adequacy to receive sedatives. The                            heart rate, respiratory rate, oxygen saturations,                            blood pressure, adequacy of pulmonary ventilation,                            and response to care were monitored throughout the                            procedure. The physical status of the patient was                            re-assessed after the procedure.                           After obtaining informed consent, the endoscope was                            passed under direct vision. Throughout the                            procedure, the patient's blood pressure, pulse, and  oxygen saturations were monitored continuously. The                            [id number]-[id number] ([id number]) Olympus endoscope was introduced                            through the mouth, and advanced to the second part                            of duodenum. Scope In: Scope Out: Findings:      LA Grade D (one or more mucosal breaks involving at least 75% of       esophageal circumference) esophagitis with no bleeding was found in the       mid and distal esophagus.      Two non-bleeding cratered gastric ulcers with a clean ulcer base       (Forrest Class III) were found in the gastric body. The largest lesion       was 7 mm in largest dimension.      Localized severe inflammation characterized by congestion (edema),       erosions and erythema was found in the gastric fundus and in the gastric       body.      The examined duodenum was normal. Impression:               - LA Grade D esophagitis with no bleeding.                            - Non-bleeding gastric ulcers with a clean ulcer                            base (Forrest Class III).                           - Gastritis.                           - Normal examined duodenum.                           - No specimens collected. Recommendation:           - Return patient to hospital ward for ongoing care.                           - It is suspected that the patient's coffee ground                            NGT output is due to gastric ulcers and severe                            esophagitis. Patient also had some signs of NGT                            trauma.                           -  Use a proton pump inhibitor PO BID for 8 weeks.                           - Use sucralfate suspension 1 gram PO QID for 4                            weeks.                           - Check serum H pylori antibody. Treat if positive.                           - Will plan to bring back to GI clinic in order to                            discuss the risks and benefits of a repeat EGD to                            assess for healing with the patient.                           - The findings and recommendations were discussed                            with the patient and primary team. Procedure Code(s):        --- Professional ---                           [phone], Esophagogastroduodenoscopy, flexible,                            transoral; diagnostic, including collection of                            specimen(s) by brushing or washing, when performed                            (separate procedure) Diagnosis Code(s):        --- Professional ---                           K20.90, Esophagitis, unspecified without bleeding                           K25.9, Gastric ulcer, unspecified as acute or                            chronic, without hemorrhage or perforation                           K29.70, Gastritis, unspecified, without bleeding CPT copyright 2019 American Medical Association. All  rights reserved. The codes documented in this report are preliminary and upon coder review may  be revised to meet current compliance requirements. Sonny Masters "Christia Reading,  04/28/2021 11:59:49 AM Number of Addenda: 0

## 2021-04-28 NOTE — Progress Notes (Signed)
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  X-ray of the abdomen showed some ileus-like features.  General surgery was consulted.  Patient undergoing conservative treatment at this time.  Patient started having some bloody output through the NG tube so GI was consulted.  Assessment/Plan:  Principal Problem:   AKI (acute kidney injury) ([place]) Active Problems:   Prostate cancer ([place])   Hypertension   General weakness   Diabetes mellitus without complication ([place])   Partial small bowel obstruction likely ileus General surgery on board.  On conservative treatment.  SBO resolving but patient did have the bloody nasogastric output so GI is on board.  Receiving packed RBC transfusion.    Small bowel follow-through shows passage of contrast in the distal colon.   Surgery has seen the patient today.  Patient has been started on TPN due to prolonged ileus.    Acute blood loss anemia secondary to upper GI bleed.   On NG tube, clearing..  Significant drop in hemoglobin so transfusion has been ordered.  Continue IV Protonix twice daily.  Continue n.p.o. except ice chips.  Patient has received 3 units of packed RBCs so far.  CKD stage IIIb. on D5 water with IV KCl due to hypokalemia.  Continue to monitor closely.  Potassium has improved at this time including sodium.  Start half-normal saline.  Generalized weakness with ambulatory dysfunction.  Physical therapy has recommended skilled nursing facility placement.  Patient will follow-up with  neurosurgery as outpatient.    Urinary retention/ incontinence History of prostate cancer status post radiation.  Continue supportive care.  External urinary catheter in place.  Elevated troponin likely secondary to  demand ischemia and acute kidney injury.  No acute issues.  Congestive heart failure:   Lasix on hold.  Overall - for 23871 mL.  Change to half-normal saline decrease rate to 50 mL/h.  History of prostate cancer.  Status post radiation treatment.  Follows up with urology as outpatient.   Bradycardia.   Improved at this time.  EKG showing PVCs and bigeminy pattern.  We will continue to monitor.  Currently on IV metoprolol due to n.p.o. status.  Blood pressure seems to be stable  History of stroke.  On aspirin and Plavix at home.  Currently NPO.  Continue to hold due to n.p.o. status   Hypokalemia.  Improved.  Will discontinue KCl with IV fluids.  Hypernatremia.  Improved at this time.  We will decrease D5 water today.  Code Status: Full  Family Communication:  Spoke with the spouse on 04/26/2021.  Unable to reach the patient's spouse today.  Disposition Plan:   Status is: Inpatient   Dispo: The patient is from: Home              Anticipated d/c is to: Skilled nursing facility.              Anticipated d/c date is: Unknown at this time, .              Patient currently not medically stable for discharge  Consultants: General surgery GI  Procedures: NG tube placement PRBC transfusion  Antimicrobials: None  DVT prophylaxis: SCD  Subjective Today, patient was seen and examined at bedside.  Patient denies any nausea vomiting or abdominal pain.  NG tube output clearing.  Had a small bowel movement yesterday.  Objective: Vitals:   04/28/21 0512 04/28/21 0747 04/28/21 0826 04/28/21 0855  BP: (!) 158/68 (!) 153/74 (!) 131/93 (!) 140/93  Pulse: 64 69 (!) 38 66  Resp: 18 18 16 16   Temp: 99.1 F (37.3 C) 98.9 F (37.2 C) 97.6 F (36.4 C) 98.6 F (37 C)   TempSrc: Oral Oral Oral Oral  SpO2: 92% 94% 100%   Weight:      Height:        Intake/Output Summary (Last 24 hours) at 04/28/2021 0941 Last data filed at 04/28/2021 0923 Gross per 24 hour  Intake 3014.23 ml  Output --  Net 3014.23 ml    Filed Weights   04/26/21 0500 04/27/21 0412 04/28/21 0430  Weight: (P) 90.4 kg 89.3 kg 88.9 kg   Body mass index is 25.86 kg/m.  Physical exam: General:  Average built, not in obvious distress, NG tube in place with clear discharge. HENT:   Mild pallor noted.  Oral mucosa is moist.  Chest:   Diminished breath sounds bilaterally.  CVS: S1 &S2 heard. No murmur.  Regular rate and rhythm. Abdomen: Soft, nontender, nondistended.  Bowel sounds are heard.   Extremities: No cyanosis, clubbing or edema.  Peripheral pulses are palpable. Psych: Alert, awake and communicative normal mood CNS:  No cranial nerve deficits.  Power equal in all extremities.   Skin: Warm and dry.  No rashes noted.   Data Reviewed:  CBC: Recent Labs  Lab 04/24/21 0330 04/25/21 0357 04/26/21 0335 04/27/21 0147 04/28/21 0239  WBC 5.5 7.3 7.7 7.6 5.7  HGB 7.7* 7.7* 7.3* 6.3* 6.7*  HCT 23.7* 23.7* 23.4* 20.5* 21.5*  MCV 97.1 99.2 101.3* 101.5* 96.8  PLT 177 188 188 194 124*    Basic Metabolic Panel: Recent Labs  Lab 04/22/21 0341 04/23/21 0154 04/24/21 0330 04/25/21 0357 04/26/21 0335 04/27/21 0147 04/28/21 0239  NA 141 138 143 148* 153* 154* 142  K 3.9 3.7 3.6 3.8 3.3* 3.2* 4.7  CL 108 106 107 112* 115* 110 102  CO2 26 26 27 25 29  34* 36*  GLUCOSE 119* 124* 113* 124* 121* 187* 476*  BUN 40* 37* 38* 53* 50* 52* 52*  CREATININE 2.54* 2.40* 2.14* 2.04* 2.15* 2.38* 2.72*  CALCIUM 8.8* 8.6* 8.8* 8.6* 8.9 8.6* 7.8*  MG 2.1 2.0 2.2 2.3 2.5* 2.5* 2.3  PHOS 4.1 3.5 3.4 2.9  --   --  3.6    GFR: Estimated Creatinine Clearance: 24.1 mL/min (A) (by C-G formula based on SCr of 2.72 mg/dL (H)). Liver Function Tests: Recent Labs  Lab 04/23/21 0154  04/27/21 0147 04/28/21 0239  AST 13* 15 13*  ALT 11 10 8   ALKPHOS 69 48 44  BILITOT 0.5 0.6 0.7  PROT 5.2* 5.0* 4.3*  ALBUMIN 2.4* 2.2* 1.9*    No results for input(s): LIPASE, AMYLASE in the last 168 hours. No results for input(s): AMMONIA in the last 168 hours. Coagulation Profile: No results for input(s): INR, PROTIME in the last 168 hours. Cardiac Enzymes: No results for input(s): CKTOTAL, CKMB, CKMBINDEX, TROPONINI in the last 168 hours. BNP (last 3 results) No results for input(s): PROBNP in the last 8760 hours. HbA1C: No results for input(s): HGBA1C in the last 72 hours. CBG: Recent Labs  Lab 04/27/21 0552 04/27/21  1125 04/27/21 1552 04/27/21 2110 04/28/21 0601  GLUCAP 175* 187* 114* 159* 158*    Lipid Profile: Recent Labs    04/28/21 0239  TRIG 141   Thyroid Function Tests: No results for input(s): TSH, T4TOTAL, FREET4, T3FREE, THYROIDAB in the last 72 hours. Anemia Panel: No results for input(s): VITAMINB12, FOLATE, FERRITIN, TIBC, IRON, RETICCTPCT in the last 72 hours. Urine analysis:    Component Value Date/Time   COLORURINE YELLOW 04/11/2021 0814   APPEARANCEUR HAZY (A) 04/11/2021 0814   APPEARANCEUR Clear 01/01/2021 1448   LABSPEC 1.010 04/11/2021 0814   PHURINE 6.0 04/11/2021 0814   GLUCOSEU NEGATIVE 04/11/2021 0814   HGBUR NEGATIVE 04/11/2021 0814   BILIRUBINUR NEGATIVE 04/11/2021 0814   BILIRUBINUR Negative 01/01/2021 [place] 04/11/2021 0814   PROTEINUR NEGATIVE 04/11/2021 0814   NITRITE NEGATIVE 04/11/2021 0814   LEUKOCYTESUR NEGATIVE 04/11/2021 0814   Sepsis Labs: @LABRCNTIP (procalcitonin:4,lacticidven:4)  ) No results found for this or any previous visit (from the past 240 hour(s)).    Studies: DG Abd Portable 1V  Result Date: 04/27/2021 CLINICAL DATA:  Enteric catheter placement EXAM: PORTABLE ABDOMEN - 1 VIEW COMPARISON:  04/27/2021 FINDINGS: Frontal view of the lower chest and upper abdomen demonstrates  slight advancement of the enteric catheter, tip projecting over the gastric fundus and side port projecting at the gastroesophageal junction. Continued nonspecific gaseous distention of the bowel. Lung bases are clear. IMPRESSION: 1. Enteric catheter as above, side port projecting at the gastroesophageal junction. Electronically Signed   By: Randa Ngo M.D.   On: 04/27/2021 22:28   DG Abd Portable 1V  Result Date: 04/27/2021 CLINICAL DATA:  NG tube placement. EXAM: PORTABLE ABDOMEN - 1 VIEW COMPARISON:  April 26, 2021 FINDINGS: Limited view of the upper abdomen demonstrates enteric catheter with tip in the expected location of the gastric cardia. The side hole is proximal to the expected location of the GE junction. Nonspecific bowel gas pattern. IMPRESSION: 1. Enteric catheter with tip in the expected location of the gastric cardia, with side hole proximal to the GE junction. Consider advancement. Electronically Signed   By: Fidela Salisbury M.D.   On: 04/27/2021 18:11   [place] EKG SITE RITE  Result Date: 04/27/2021 If Site Rite image not attached, placement could not be confirmed due to current cardiac rhythm.   Scheduled Meds:  acetaminophen  650 mg Oral Once   Chlorhexidine Gluconate Cloth  6 each Topical Daily   diphenhydrAMINE  25 mg Oral Once   insulin aspart  0-9 Units Subcutaneous TID WC   metoprolol tartrate  2.5 mg Intravenous Q6H   sodium chloride flush  10-40 mL Intracatheter Q12H   tamsulosin  0.8 mg Oral QHS    Continuous Infusions:  dextrose 5 % with KCl 20 mEq / L 100 mL/hr at 04/28/21 0048   pantoprazole 8 mg/hr (04/28/21 0413)   promethazine (PHENERGAN) injection (IM or IVPB) Stopped (04/21/21 1930)     LOS: 18 days    Flora Lipps, MD [facility] 04/28/2021, 9:41 AM

## 2021-04-28 NOTE — Anesthesia Procedure Notes (Signed)
Procedure Name: Intubation Date/Time: 04/28/2021 11:20 AM Performed by: Lavell Luster, CRNA Pre-anesthesia Checklist: Patient identified, Emergency Drugs available, Suction available, Patient being monitored and Timeout performed Patient Re-evaluated:Patient Re-evaluated prior to induction Oxygen Delivery Method: Circle system utilized Preoxygenation: Pre-oxygenation with 100% oxygen Induction Type: IV induction, Rapid sequence and Cricoid Pressure applied Laryngoscope Size: Mac, 4 and Glidescope Grade View: Grade I Tube type: Oral Tube size: 7.5 mm Number of attempts: 1 Airway Equipment and Method: Stylet Placement Confirmation: ETT inserted through vocal cords under direct vision, positive ETCO2 and breath sounds checked- equal and bilateral Secured at: 22 cm Tube secured with: Tape Dental Injury: Teeth and Oropharynx as per pre-operative assessment

## 2021-04-28 NOTE — Progress Notes (Addendum)
Subjective: CC: NGT output more bilious today. 500cc in cannister He denies any current abdominal pain, distension or nausea.  Continues to pass flatus. Notes hard, small bm yesterday.   Objective: Vital signs in last 24 hours: Temp:  [97.5 F (36.4 C)-99.1 F (37.3 C)] 98.6 F (37 C) (09/12 0855) Pulse Rate:  [38-91] 66 (09/12 0855) Resp:  [16-20] 16 (09/12 0855) BP: (118-158)/(56-93) 140/93 (09/12 0855) SpO2:  [91 %-100 %] 100 % (09/12 0826) Weight:  [88.9 kg] 88.9 kg (09/12 0430) Last BM Date: 04/24/21  Intake/Output from previous day: 09/11 0701 - 09/12 0700 In: 2658.2 [P.O.:10; I.V.:1726.2; Blood:922] Out: -  Intake/Output this shift: Total I/O In: 346 [Blood:346] Out: -   PE: Gen:  Alert, NAD, pleasant Abd: Soft, mild distension, NT, +BS. NGT w/ dark output in cannister but output in tubing appears more bilious and without red tinged output that was noted yesterday.  Skin: no rashes noted, warm and dry  Lab Results:  Recent Labs    04/27/21 0147 04/28/21 0239  WBC 7.6 5.7  HGB 6.3* 6.7*  HCT 20.5* 21.5*  PLT 194 124*   BMET Recent Labs    04/27/21 0147 04/28/21 0239  NA 154* 142  K 3.2* 4.7  CL 110 102  CO2 34* 36*  GLUCOSE 187* 476*  BUN 52* 52*  CREATININE 2.38* 2.72*  CALCIUM 8.6* 7.8*   PT/INR No results for input(s): LABPROT, INR in the last 72 hours. CMP     Component Value Date/Time   NA 142 04/28/2021 0239   NA 141 09/09/2020 1617   K 4.7 04/28/2021 0239   CL 102 04/28/2021 0239   CO2 36 (H) 04/28/2021 0239   GLUCOSE 476 (H) 04/28/2021 0239   BUN 52 (H) 04/28/2021 0239   BUN 39 (H) 09/09/2020 1617   CREATININE 2.72 (H) 04/28/2021 0239   CALCIUM 7.8 (L) 04/28/2021 0239   PROT 4.3 (L) 04/28/2021 0239   ALBUMIN 1.9 (L) 04/28/2021 0239   AST 13 (L) 04/28/2021 0239   ALT 8 04/28/2021 0239   ALKPHOS 44 04/28/2021 0239   BILITOT 0.7 04/28/2021 0239   GFRNONAA 23 (L) 04/28/2021 0239   GFRAA 40 (L) 09/09/2020 1617    Lipase     Component Value Date/Time   LIPASE 42 04/10/2021 1438    Studies/Results: DG Abd Portable 1V  Result Date: 04/27/2021 CLINICAL DATA:  Enteric catheter placement EXAM: PORTABLE ABDOMEN - 1 VIEW COMPARISON:  04/27/2021 FINDINGS: Frontal view of the lower chest and upper abdomen demonstrates slight advancement of the enteric catheter, tip projecting over the gastric fundus and side port projecting at the gastroesophageal junction. Continued nonspecific gaseous distention of the bowel. Lung bases are clear. IMPRESSION: 1. Enteric catheter as above, side port projecting at the gastroesophageal junction. Electronically Signed   By: Randa Ngo M.D.   On: 04/27/2021 22:28   DG Abd Portable 1V  Result Date: 04/27/2021 CLINICAL DATA:  NG tube placement. EXAM: PORTABLE ABDOMEN - 1 VIEW COMPARISON:  April 26, 2021 FINDINGS: Limited view of the upper abdomen demonstrates enteric catheter with tip in the expected location of the gastric cardia. The side hole is proximal to the expected location of the GE junction. Nonspecific bowel gas pattern. IMPRESSION: 1. Enteric catheter with tip in the expected location of the gastric cardia, with side hole proximal to the GE junction. Consider advancement. Electronically Signed   By: Fidela Salisbury M.D.   On: 04/27/2021 18:11  [place] EKG SITE RITE  Result Date: 04/27/2021 If Site Rite image not attached, placement could not be confirmed due to current cardiac rhythm.   Anti-infectives: Anti-infectives (From admission, onward)    None        Assessment/Plan SBO - CT 9/6 w/ sbo w/ transition in the mid abdomen. He has remote hx of L inguinal hernia repair and ex lap, loa and meckle's diverticulectomy.  - xray 9/10 with contrast into the distal colon. He is passing flatus and reports small bm yesterday. NGT output down.  - From our standpoint, sbo appears to be resolved/resolving and we could start clamping trials. Will defer to GI  given his bloody NGT output yesterday and noted plan for NG lavage and possible EGD today.     FEN - NPO/NGT/IVFs, PPI. If continues to remain NPO, consider TPN VTE - on hold for GI bleed.  ID - none   ABL anemia - UGI bleed noted and GI consulted 9/11. HGb 6.7, plt 124. 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

## 2021-04-28 NOTE — Anesthesia Preprocedure Evaluation (Signed)
Anesthesia Evaluation  Patient identified by MRN, date of birth, ID band Patient awake    Reviewed: Allergy & Precautions, NPO status , Patient's Chart, lab work & pertinent test results  Airway Mallampati: II  TM Distance: >3 FB     Dental   Pulmonary neg pulmonary ROS,    breath sounds clear to auscultation       Cardiovascular hypertension, Pt. on medications  Rhythm:Regular Rate:Normal     Neuro/Psych CVA    GI/Hepatic Neg liver ROS, SBO with bloody NG output   Endo/Other  diabetes, Type 2  Renal/GU Renal InsufficiencyRenal disease     Musculoskeletal   Abdominal   Peds  Hematology  (+) anemia ,   Anesthesia Other Findings   Reproductive/Obstetrics                             Anesthesia Physical Anesthesia Plan  ASA: 4  Anesthesia Plan: General   Post-op Pain Management:    Induction: Intravenous and Rapid sequence  PONV Risk Score and Plan: 2 and Dexamethasone, Ondansetron and Treatment may vary due to age or medical condition  Airway Management Planned: Oral ETT  Additional Equipment:   Intra-op Plan:   Post-operative Plan: Extubation in OR  Informed Consent: I have reviewed the patients History and Physical, chart, labs and discussed the procedure including the risks, benefits and alternatives for the proposed anesthesia with the patient or authorized representative who has indicated his/her understanding and acceptance.     Dental advisory given  Plan Discussed with: CRNA  Anesthesia Plan Comments:         Anesthesia Quick Evaluation

## 2021-04-28 NOTE — Progress Notes (Signed)
Physical Therapy Treatment Patient Details Name: Thomas Duarte MRN: [id number] DOB: [birth year] Today's Date: 04/28/2021   History of Present Illness Pt is an 80 y.o. male admitted 04/10/21 with c/o worsening BLE weakness, urinary incontinence; of note, pt recently started prostate CA treatment (completed 4 wks of radiation 2 wks prior). Workup for AKI on CKD 3. Lumbar MRI showed degenerative disease. 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    PT Comments    Pt still groggy from procedure this morning but agreeable to get up to chair so malfunctioning bed can be switched out. Pt is very happy to have NG tube removed. Pt requires mod A for bed mobility and transfers today. D/c plans remain appropriate at this time. PT will continue to follow acutely.      Recommendations for follow up therapy are one component of a multi-disciplinary discharge planning process, led by the attending physician.  Recommendations may be updated based on patient status, additional functional criteria and insurance authorization.  Follow Up Recommendations  SNF;Supervision for mobility/OOB     Equipment Recommendations  None recommended by PT       Precautions / Restrictions Precautions Precautions: Fall Precaution Comments: NG tube Restrictions Weight Bearing Restrictions: No     Mobility  Bed Mobility Overal bed mobility: Needs Assistance Bed Mobility: Supine to Sit     Supine to sit: HOB elevated;Mod assist     General bed mobility comments: modA for managing LE off bed and for bringing trunk to upright, with increased cuing able to scoot hips back in bed    Transfers Overall transfer level: Needs assistance Equipment used: 1 person hand held assist Transfers: Sit to/from [employer] Transfers Sit to Stand: Mod assist Stand pivot transfers: Mod  assist       General transfer comment: modA for power up and steadying, increased cuing for upright posture, and for stepping and eventually pivoting to recliner on his R  Ambulation/Gait             General Gait Details: unable to do today due to [place] Overall balance assessment: Needs assistance Sitting-balance support: No upper extremity supported;Feet supported Sitting balance-Leahy Scale: Good     Standing balance support: Bilateral upper extremity supported;Single extremity supported;During functional activity Standing balance-Leahy Scale: Fair Standing balance comment: Reliant on UE support                            Cognition Arousal/Alertness: Lethargic (recovering from anesthesia) Behavior During Therapy: WFL for tasks assessed/performed Overall Cognitive Status: No family/caregiver present to determine baseline cognitive functioning Area of Impairment: Awareness;Problem solving;Safety/judgement                       Following Commands: Follows one step commands consistently;Follows one step commands with increased time Safety/Judgement: Decreased awareness of safety;Decreased awareness of deficits Awareness: Emergent Problem Solving: Slow processing;Requires verbal cues;Difficulty sequencing General Comments: requires increased time for command follow         General Comments General comments (skin integrity, edema, etc.): On entry pt asleep with phone at his ear and no one on other end of line. Able to rouse pt enough for transfer to chair to replace broken bed      Pertinent Vitals/Pain Faces Pain Scale: No hurt  PT Goals (current goals can now be found in the care plan section) Acute Rehab PT Goals Patient Stated Goal: to improve strength and return to independence PT Goal Formulation: With patient Time For Goal Achievement: 04/26/21 Potential to Achieve Goals: Good Progress towards PT goals: Not  progressing toward goals - comment (due mainly to still being groggy from anesthesia)    Frequency    Min 3X/week      PT Plan Current plan remains appropriate       AM-PAC PT "6 Clicks" Mobility   Outcome Measure  Help needed turning from your back to your side while in a flat bed without using bedrails?: None Help needed moving from lying on your back to sitting on the side of a flat bed without using bedrails?: A Little Help needed moving to and from a bed to a chair (including a wheelchair)?: A Little Help needed standing up from a chair using your arms (e.g., wheelchair or bedside chair)?: A Little Help needed to walk in hospital room?: A Little Help needed climbing 3-5 steps with a railing? : A Lot 6 Click Score: 18    End of Session Equipment Utilized During Treatment: Gait belt Activity Tolerance: Patient tolerated treatment well Patient left: with call bell/phone within reach;in chair;with chair alarm set Nurse Communication: Mobility status PT Visit Diagnosis: Other abnormalities of gait and mobility (R26.89);Muscle weakness (generalized) (M62.81)     Time: 4239-5320 PT Time Calculation (min) (ACUTE ONLY): 22 min  Charges:  $Therapeutic Activity: 8-22 mins                     Tyarra Nolton B. Migdalia Dk PT, DPT Acute Rehabilitation Services Pager [phone] Office [phone]    [place] 04/28/2021, 4:52 PM

## 2021-04-28 NOTE — Progress Notes (Signed)
Blood administration completed during procedure 1110. Post blood vital signs: see anesthesia event in chart

## 2021-04-28 NOTE — Transfer of Care (Signed)
Immediate Anesthesia Transfer of Care Note  Patient: Thomas Duarte  Procedure(s) Performed: ESOPHAGOGASTRODUODENOSCOPY (EGD) WITH PROPOFOL  Patient Location: Endoscopy Unit  Anesthesia Type:General  Level of Consciousness: awake, alert  and sedated  Airway & Oxygen Therapy: Patient connected to nasal cannula oxygen  Post-op Assessment: Post -op Vital signs reviewed and stable  Post vital signs: stable  Last Vitals:  Vitals Value Taken Time  BP    Temp    Pulse 69 04/28/21 1203  Resp 13 04/28/21 1203  SpO2 100 % 04/28/21 1203  Vitals shown include unvalidated device data.  Last Pain:  Vitals:   04/28/21 1017  TempSrc: Temporal  PainSc: 0-No pain      Patients Stated Pain Goal: 0 (75/10/25 8527)  Complications: No notable events documented.

## 2021-04-28 NOTE — Progress Notes (Addendum)
Initial Nutrition Assessment  DOCUMENTATION CODES:   Non-severe (moderate) malnutrition in context of chronic illness  INTERVENTION:  -Start TPN to meet 100% of pt estimated needs.        -Continue TPN until pt can eat at least 60% via PO.  -Start Boost Breeze - BID        - Provides 250 kcal & 9 gm PRO each  -Advance diet as medically able   -Start MVI w/ minerals  -Monitor for refeeding syndrome  NUTRITION DIAGNOSIS:   Moderate Malnutrition related to chronic illness as evidenced by mild fat depletion, mild muscle depletion.  GOAL:   Patient will meet greater than or equal to 90% of their needs  MONITOR:   PO intake, Supplement acceptance  REASON FOR ASSESSMENT:   Consult New TPN/TNA  ASSESSMENT:   Pt admitted for BLE weakness; pt began to have nausea and vomiting on 9/5, determined to have a small bowel obstruction. PMH of CHF, T2DM, CKD III, HtN, and prostate cancer (post radiation).  9/6: NG placed for LIS 9/11: PICC placed 9/12: EGD (findings: 2 gastric ulcers and esophagitis), NG removed  Pt reports that his appetite has been poor for about 3-4 months prior to hospitalization. He would only eat 2 meals/day, breakfast and supper; typically has for breakfast: eggs and bacon (occasionally sausage), supper: sausage.    Pt reports that his UBW is 295# and that he has not noticed any weight loss recently. Per EMR, pt has had an 8% weight loss in 6 months. Pt reports that he has not been getting around much at home either.   Pt has been NPO since 9/5 and starting on TPN today, diet advanced to clear liquids. TPN starting at 40 mL/hr and increasing to goal at 85 mL/hr; goal provides 2166 kcal and 106 gm PRO.   Medications reviewed: SSI 0-15 units q4h Labs reviewed.   NUTRITION - FOCUSED PHYSICAL EXAM:  Flowsheet Row Most Recent Value  Orbital Region Mild depletion  Upper Arm Region Mild depletion  Thoracic and Lumbar Region No depletion  Buccal Region Mild  depletion  Temple Region Mild depletion  Clavicle Bone Region Mild depletion  Clavicle and Acromion Bone Region Mild depletion  Scapular Bone Region Mild depletion  Dorsal Hand Mild depletion  Patellar Region Moderate depletion  Anterior Thigh Region Moderate depletion  Posterior Calf Region Moderate depletion  Edema (RD Assessment) None  Hair Reviewed  Eyes Reviewed  Mouth Reviewed  [no teeth, has dentures]  Skin Reviewed  Nails Reviewed       Diet Order:   Diet Order             Diet clear liquid Room service appropriate? Yes; Fluid consistency: Thin  Diet effective now           Diet - low sodium heart healthy                   EDUCATION NEEDS:   No education needs have been identified at this time  Skin:  Skin Assessment: Reviewed RN Assessment  Last BM:  9/8  Height:   Ht Readings from Last 1 Encounters:  04/10/21 6\' 1"  (1.854 m)    Weight:   Wt Readings from Last 1 Encounters:  04/28/21 88.9 kg    Ideal Body Weight:  83.6 kg  BMI:  Body mass index is 25.86 kg/m.  Estimated Nutritional Needs:   Kcal:  2200-2400  Protein:  110-120 gm  Fluid:  > 2.2 L  Hermina Barters BS, PLDN Clinical Dietitian See [facility] for contact information.

## 2021-04-29 ENCOUNTER — Encounter (HOSPITAL_COMMUNITY): Payer: Self-pay | Admitting: Internal Medicine

## 2021-04-29 DIAGNOSIS — E440 Moderate protein-calorie malnutrition: Secondary | ICD-10-CM | POA: Insufficient documentation

## 2021-04-29 LAB — GLUCOSE, CAPILLARY
Glucose-Capillary: 132 mg/dL — ABNORMAL HIGH (ref 70–99)
Glucose-Capillary: 135 mg/dL — ABNORMAL HIGH (ref 70–99)
Glucose-Capillary: 145 mg/dL — ABNORMAL HIGH (ref 70–99)
Glucose-Capillary: 200 mg/dL — ABNORMAL HIGH (ref 70–99)
Glucose-Capillary: 296 mg/dL — ABNORMAL HIGH (ref 70–99)
Glucose-Capillary: 99 mg/dL (ref 70–99)

## 2021-04-29 LAB — CBC
HCT: 28.8 % — ABNORMAL LOW (ref 39.0–52.0)
Hemoglobin: 9.4 g/dL — ABNORMAL LOW (ref 13.0–17.0)
MCH: 30.2 pg (ref 26.0–34.0)
MCHC: 32.6 g/dL (ref 30.0–36.0)
MCV: 92.6 fL (ref 80.0–100.0)
Platelets: 112 10*3/uL — ABNORMAL LOW (ref 150–400)
RBC: 3.11 MIL/uL — ABNORMAL LOW (ref 4.22–5.81)
RDW: 17.6 % — ABNORMAL HIGH (ref 11.5–15.5)
WBC: 5.9 10*3/uL (ref 4.0–10.5)
nRBC: 0.5 % — ABNORMAL HIGH (ref 0.0–0.2)

## 2021-04-29 LAB — COMPREHENSIVE METABOLIC PANEL
ALT: 9 U/L (ref 0–44)
AST: 16 U/L (ref 15–41)
Albumin: 2.1 g/dL — ABNORMAL LOW (ref 3.5–5.0)
Alkaline Phosphatase: 47 U/L (ref 38–126)
Anion gap: 8 (ref 5–15)
BUN: 46 mg/dL — ABNORMAL HIGH (ref 8–23)
CO2: 34 mmol/L — ABNORMAL HIGH (ref 22–32)
Calcium: 8.2 mg/dL — ABNORMAL LOW (ref 8.9–10.3)
Chloride: 104 mmol/L (ref 98–111)
Creatinine, Ser: 2.55 mg/dL — ABNORMAL HIGH (ref 0.61–1.24)
GFR, Estimated: 25 mL/min — ABNORMAL LOW (ref >60)
Glucose, Bld: 107 mg/dL — ABNORMAL HIGH (ref 70–99)
Potassium: 3.2 mmol/L — ABNORMAL LOW (ref 3.5–5.1)
Sodium: 146 mmol/L — ABNORMAL HIGH (ref 135–145)
Total Bilirubin: 0.9 mg/dL (ref 0.3–1.2)
Total Protein: 4.7 g/dL — ABNORMAL LOW (ref 6.5–8.1)

## 2021-04-29 LAB — PHOSPHORUS: Phosphorus: 3.7 mg/dL (ref 2.5–4.6)

## 2021-04-29 LAB — MAGNESIUM: Magnesium: 2.4 mg/dL (ref 1.7–2.4)

## 2021-04-29 MED ORDER — INSULIN ASPART 100 UNIT/ML IJ SOLN
0.0000 [IU] | Freq: Three times a day (TID) | INTRAMUSCULAR | Status: DC
  Administered 2021-04-29: 8 [IU] via SUBCUTANEOUS
  Administered 2021-04-30: 3 [IU] via SUBCUTANEOUS
  Administered 2021-04-30: 5 [IU] via SUBCUTANEOUS
  Administered 2021-05-01 – 2021-05-02 (×4): 3 [IU] via SUBCUTANEOUS
  Administered 2021-05-03: 2 [IU] via SUBCUTANEOUS
  Administered 2021-05-03: 3 [IU] via SUBCUTANEOUS
  Administered 2021-05-03: 8 [IU] via SUBCUTANEOUS
  Administered 2021-05-04: 2 [IU] via SUBCUTANEOUS

## 2021-04-29 MED ORDER — POLYETHYLENE GLYCOL 3350 17 G PO PACK
17.0000 g | PACK | Freq: Every day | ORAL | Status: DC
  Administered 2021-04-30: 17 g via ORAL
  Filled 2021-04-29 (×2): qty 1

## 2021-04-29 MED ORDER — DOCUSATE SODIUM 100 MG PO CAPS
100.0000 mg | ORAL_CAPSULE | Freq: Two times a day (BID) | ORAL | Status: DC
  Administered 2021-04-29 – 2021-05-03 (×9): 100 mg via ORAL
  Filled 2021-04-29 (×10): qty 1

## 2021-04-29 MED ORDER — POTASSIUM CHLORIDE 10 MEQ/50ML IV SOLN
10.0000 meq | INTRAVENOUS | Status: AC
  Administered 2021-04-29 (×4): 10 meq via INTRAVENOUS
  Filled 2021-04-29 (×4): qty 50

## 2021-04-29 MED ORDER — BISACODYL 10 MG RE SUPP
10.0000 mg | Freq: Once | RECTAL | Status: DC
  Filled 2021-04-29: qty 1

## 2021-04-29 MED ORDER — METOPROLOL SUCCINATE ER 25 MG PO TB24
25.0000 mg | ORAL_TABLET | Freq: Every day | ORAL | Status: DC
  Administered 2021-04-29 – 2021-05-03 (×5): 25 mg via ORAL
  Filled 2021-04-29 (×6): qty 1

## 2021-04-29 MED ORDER — PANTOPRAZOLE SODIUM 40 MG IV SOLR
40.0000 mg | Freq: Two times a day (BID) | INTRAVENOUS | Status: DC
  Administered 2021-04-29 – 2021-04-30 (×3): 40 mg via INTRAVENOUS
  Filled 2021-04-29 (×3): qty 40

## 2021-04-29 MED ORDER — SUCRALFATE 1 G PO TABS
1.0000 g | ORAL_TABLET | Freq: Three times a day (TID) | ORAL | Status: DC
  Administered 2021-04-29 – 2021-05-04 (×18): 1 g via ORAL
  Filled 2021-04-29 (×18): qty 1

## 2021-04-29 NOTE — Progress Notes (Addendum)
1 Day Post-Op  Subjective: CC: Doing well. No abdominal pain. Does not feel distended. Denies n/v and tolerating cld. Passing flatus. Small hard bm 9/11, none yesterday.   Objective: Vital signs in last 24 hours: Temp:  [97.7 F (36.5 C)-98.7 F (37.1 C)] 97.7 F (36.5 C) (09/13 1003) Pulse Rate:  [61-72] 68 (09/13 1003) Resp:  [12-20] 19 (09/13 1003) BP: (125-179)/(54-138) 144/126 (09/13 1003) SpO2:  [94 %-100 %] 97 % (09/13 1003) Weight:  [92.4 kg] 92.4 kg (09/13 0005) Last BM Date: 04/24/21  Intake/Output from previous day: 09/12 0701 - 09/13 0700 In: 2503.3 [P.O.:620; I.V.:1206.9; Blood:646; IV Piggyback:30.4] Out: 750 [Urine:750] Intake/Output this shift: Total I/O In: 636.9 [P.O.:480; I.V.:56.9; IV Piggyback:100] Out: 600 [Urine:600]  PE: Gen:  Alert, NAD, pleasant Abd: Soft, mild distension, NT, +BS.  Skin: no rashes noted, warm and dry  Lab Results:  Recent Labs    04/28/21 0239 04/28/21 1331 04/29/21 0345  WBC 5.7  --  5.9  HGB 6.7* 9.5* 9.4*  HCT 21.5* 28.9* 28.8*  PLT 124*  --  112*   BMET Recent Labs    04/28/21 1331 04/29/21 0345  NA 151* 146*  K 3.0* 3.2*  CL 105 104  CO2 36* 34*  GLUCOSE 124* 107*  BUN 48* 46*  CREATININE 2.68* 2.55*  CALCIUM 7.8* 8.2*   PT/INR No results for input(s): LABPROT, INR in the last 72 hours. CMP     Component Value Date/Time   NA 146 (H) 04/29/2021 0345   NA 141 09/09/2020 1617   K 3.2 (L) 04/29/2021 0345   CL 104 04/29/2021 0345   CO2 34 (H) 04/29/2021 0345   GLUCOSE 107 (H) 04/29/2021 0345   BUN 46 (H) 04/29/2021 0345   BUN 39 (H) 09/09/2020 1617   CREATININE 2.55 (H) 04/29/2021 0345   CALCIUM 8.2 (L) 04/29/2021 0345   PROT 4.7 (L) 04/29/2021 0345   ALBUMIN 2.1 (L) 04/29/2021 0345   AST 16 04/29/2021 0345   ALT 9 04/29/2021 0345   ALKPHOS 47 04/29/2021 0345   BILITOT 0.9 04/29/2021 0345   GFRNONAA 25 (L) 04/29/2021 0345   GFRAA 40 (L) 09/09/2020 1617   Lipase     Component Value  Date/Time   LIPASE 42 04/10/2021 1438    Studies/Results: DG Abd Portable 1V  Result Date: 04/27/2021 CLINICAL DATA:  Enteric catheter placement EXAM: PORTABLE ABDOMEN - 1 VIEW COMPARISON:  04/27/2021 FINDINGS: Frontal view of the lower chest and upper abdomen demonstrates slight advancement of the enteric catheter, tip projecting over the gastric fundus and side port projecting at the gastroesophageal junction. Continued nonspecific gaseous distention of the bowel. Lung bases are clear. IMPRESSION: 1. Enteric catheter as above, side port projecting at the gastroesophageal junction. Electronically Signed   By: Randa Ngo M.D.   On: 04/27/2021 22:28   DG Abd Portable 1V  Result Date: 04/27/2021 CLINICAL DATA:  NG tube placement. EXAM: PORTABLE ABDOMEN - 1 VIEW COMPARISON:  April 26, 2021 FINDINGS: Limited view of the upper abdomen demonstrates enteric catheter with tip in the expected location of the gastric cardia. The side hole is proximal to the expected location of the GE junction. Nonspecific bowel gas pattern. IMPRESSION: 1. Enteric catheter with tip in the expected location of the gastric cardia, with side hole proximal to the GE junction. Consider advancement. Electronically Signed   By: Fidela Salisbury M.D.   On: 04/27/2021 18:11   [place] EKG SITE RITE  Result Date: 04/27/2021 If Site  Rite image not attached, placement could not be confirmed due to current cardiac rhythm.   Anti-infectives: Anti-infectives (From admission, onward)    None        Assessment/Plan SBO - CT 9/6 w/ sbo w/ transition in the mid abdomen. He has remote hx of L inguinal hernia repair and ex lap, loa and meckle's diverticulectomy.  - xray 9/10 with contrast into the distal colon. He is passing flatus and reports small bm 9/11. Tolerating cld w/o n/v. Clinically improving - Adv to FLD - Add bowel regimen - Wean TPN - Therapies rec SNF    FEN - FLD, AAT to reg diet. Wean TPN, replace K VTE -  per TRH ID - none   - Per TRH -  ABL anemia - UGI bleed noted and GI consulted 9/11. EGD 9/12. On PPI. Per GI. 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

## 2021-04-29 NOTE — TOC Progression Note (Signed)
Transition of Care [facility]) - Progression Note    Patient Details  Name: Thomas Duarte MRN: [id number] Date of Birth: [birth year]  Transition of Care [facility]) CM/SW Contact  Reece Agar, [place] Phone Number: 04/29/2021, 3:11 PM  Clinical Narrative:    CSW messaged Ebony Hail at [facility] to inform her of pt progress and to get auth started again for [employer]. CSW will follow up for pt options a DC needs.   Expected Discharge Plan: [place] Barriers to Discharge: Continued Medical Work up  Expected Discharge Plan and Services Expected Discharge Plan: [place] In-house Referral: Clinical Social Work   Post Acute Care Choice: [place] Living arrangements for the past 2 months: Single Family Home Expected Discharge Date: 04/16/21                                     Social Determinants of Health (SDOH) Interventions    Readmission Risk Interventions No flowsheet data found.

## 2021-04-29 NOTE — Anesthesia Postprocedure Evaluation (Signed)
Anesthesia Post Note  Patient: Thomas Duarte  Procedure(s) Performed: ESOPHAGOGASTRODUODENOSCOPY (EGD) WITH PROPOFOL     Patient location during evaluation: PACU Anesthesia Type: General Level of consciousness: awake and alert Pain management: pain level controlled Vital Signs Assessment: post-procedure vital signs reviewed and stable Respiratory status: spontaneous breathing, nonlabored ventilation, respiratory function stable and patient connected to nasal cannula oxygen Cardiovascular status: blood pressure returned to baseline and stable Postop Assessment: no apparent nausea or vomiting Anesthetic complications: no   No notable events documented.  Last Vitals:  Vitals:   04/29/21 1003 04/29/21 1226  BP: (!) 144/126 125/63  Pulse: 68 (!) 59  Resp: 19 16  Temp: 36.5 C 36.6 C  SpO2: 97%     Last Pain:  Vitals:   04/29/21 1003  TempSrc: Oral  PainSc:                  Tiajuana Amass

## 2021-04-29 NOTE — Progress Notes (Signed)
Occupational Therapy Treatment Patient Details Name: Thomas Duarte MRN: [id number] DOB: 04/18/1941 Today's Date: 04/29/2021   History of present illness Pt is an 80 y.o. male admitted 04/10/21 with c/o worsening BLE weakness, urinary incontinence; of note, pt recently started prostate CA treatment (completed 4 wks of radiation 2 wks prior). Workup for AKI on CKD 3. Lumbar MRI showed degenerative disease. 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   OT comments  Brief OT session completed on this date with limitations due to bowel incontinence, need for new condom cath (NT in to assist) and arrival of lunch tray. Pt able to demo standing with RW at [place] [facility] for peri care assist after incontinence. Pt denies dizziness in standing today and denies pain with assessment of functional tasks for LB ADLs. Plan to educate in UE HEP and progress standing balance with ADLs at sink in next session.    Recommendations for follow up therapy are one component of a multi-disciplinary discharge planning process, led by the attending physician.  Recommendations may be updated based on patient status, additional functional criteria and insurance authorization.    Follow Up Recommendations  SNF;Supervision/Assistance - 24 hour    Equipment Recommendations  Tub/shower seat;Other (comment) (Rolling walker)    Recommendations for Other Services      Precautions / Restrictions Precautions Precautions: Fall Restrictions Weight Bearing Restrictions: No       Mobility Bed Mobility               General bed mobility comments: received in chair    Transfers Overall transfer level: Needs assistance Equipment used: Rolling walker (2 wheeled) Transfers: Sit to/from Stand Sit to Stand: Min assist         General transfer comment: Min A for power up from recliner, cues for  hand placement    Balance Overall balance assessment: Needs assistance Sitting-balance support: No upper extremity supported;Feet supported Sitting balance-Leahy Scale: Good     Standing balance support: Bilateral upper extremity supported;Single extremity supported;During functional activity Standing balance-Leahy Scale: Poor Standing balance comment: Reliant on UE support                           ADL either performed or assessed with clinical judgement   ADL Overall ADL's : Needs assistance/impaired                       Lower Body Dressing Details (indicate cue type and reason): NT did assist pt with donning socks on OT entry. Pt able to return demo functional skills to reach down to pull up B socks without pain or difficulty. Will still likely require assist for clothing mgmt in standing due to endurance and standing balance deficits     Toileting- Clothing Manipulation and Hygiene: Total assistance;Sit to/from stand Toileting - Clothing Manipulation Details (indicate cue type and reason): Total A for cleanup after bowel incontinence in chair       General ADL Comments: Limited session due to bowel incontinence, need for new condom cath (NT in to assist) and arrival of lunch tray. pt with improved appearance of feeling better after NG removal - denies dizziness in standing today     Vision   Vision Assessment?: No apparent visual deficits   Perception     Praxis  Cognition Arousal/Alertness: Awake/alert Behavior During Therapy: WFL for tasks assessed/performed Overall Cognitive Status: No family/caregiver present to determine baseline cognitive functioning Area of Impairment: Awareness;Problem solving;Safety/judgement                       Following Commands: Follows one step commands consistently;Follows one step commands with increased time Safety/Judgement: Decreased awareness of safety;Decreased awareness of deficits Awareness:  Emergent Problem Solving: Slow processing;Requires verbal cues;Difficulty sequencing General Comments: requires increased time for command follow though pt also HOH which may impact response time        Exercises     Shoulder Instructions       General Comments      Pertinent Vitals/ Pain       Pain Assessment: No/denies pain  Home Living                                          Prior Functioning/Environment              Frequency  Min 2X/week        Progress Toward Goals  OT Goals(current goals can now be found in the care plan section)  Progress towards OT goals: Progressing toward goals  Acute Rehab OT Goals Patient Stated Goal: to improve strength and return to independence OT Goal Formulation: With patient Time For Goal Achievement: 05/09/21 Potential to Achieve Goals: Good ADL Goals Pt Will Perform Grooming: standing;with min guard assist Pt Will Perform Lower Body Dressing: with min assist;with adaptive equipment;sit to/from stand Pt Will Transfer to Toilet: with min guard assist;regular height toilet;bedside commode;ambulating Pt Will Perform Toileting - Clothing Manipulation and hygiene: with min guard assist;sit to/from stand Pt/caregiver will Perform Home Exercise Program: Increased strength;Both right and left upper extremity;With theraband;Independently;With written HEP provided Additional ADL Goal #1: Pt to complete bed mobility with supervision as precursor to ADLs  Plan Discharge plan remains appropriate;Frequency remains appropriate    Co-evaluation                 AM-PAC OT "6 Clicks" Daily Activity     Outcome Measure   Help from another person eating meals?: A Little Help from another person taking care of personal grooming?: A Little Help from another person toileting, which includes using toliet, bedpan, or urinal?: Total Help from another person bathing (including washing, rinsing, drying)?: A Lot Help from  another person to put on and taking off regular upper body clothing?: A Little Help from another person to put on and taking off regular lower body clothing?: A Lot 6 Click Score: 14    End of Session Equipment Utilized During Treatment: Rolling walker  OT Visit Diagnosis: Other abnormalities of gait and mobility (R26.89);Muscle weakness (generalized) (M62.81)   Activity Tolerance Patient tolerated treatment well   Patient Left in chair;with call bell/phone within reach;with nursing/sitter in room   Nurse Communication Mobility status        Time: 9381-8299 OT Time Calculation (min): 10 min  Charges: OT General Charges $OT Visit: 1 Visit OT Treatments $Self Care/Home Management : 8-22 mins  Malachy Chamber, OTR/L Acute Rehab Services Office: [phone]   Layla Maw 04/29/2021, 12:24 PM

## 2021-04-29 NOTE — Progress Notes (Signed)
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  X-ray of the abdomen showed some ileus-like features.  General surgery was consulted.  Patient underwent conservative treatment for partial small bowel obstruction.TPN was initiated.  Barium follow-through showed  contrast in the colon.   Patient was then advised full liquids today.  [place] hospitalization, patient underwent EGD due to bloody NG aspirate and was noted to have gastric ulcers, esophagitis and NG tube trauma.  Patient is currently awaiting for skilled nursing facility placement.  We will need to wean TPN and advance diet prior to discharge.  Assessment/Plan:  Principal Problem:   AKI (acute kidney injury) ([place]) Active Problems:   Prostate cancer ([place])   Hypertension   General weakness   Diabetes mellitus without complication ([facility])   Malnutrition of moderate degree   Partial small bowel obstruction likely ileus General surgery on board.  Improved with conservative treatment.  Patient initially had NG tube for gastric decompression.  Subsequently, patient started having bloody NG aspirate and GI was consulted.  On Protonix twice daily.  EGD done showed ulcers and esophagitis with NG tube trauma.  Currently off NG tube.  Has been advanced on full liquids at this time.  Patient has been started on TPN due to prolonged ileus.  Once the patient is adequately eating could be discontinued.  Follow surgical recommendations.  Acute blood loss anemia secondary to upper GI bleed likely from  severe esophagitis and gastric ulcers with NG tube trauma.    On IV Protonix twice daily.  received 3 units of packed RBCs so far.  Status post EGD 04/28/2021 with findings of esophagitis and 2 nonbleeding cratered gastric ulcers with noted with inflammation of the gastric mucosa..  GI recommends proton pump inhibitor twice daily for 8 weeks and sucralfate 1 g p.o. 4 times daily for 4 weeks.  Serum as pylori antibody has been sent today.  Treat for H. pylori if positive.  Gastric biopsy was not obtained due to bleeding concerns  CKD stage IIIb.  Continue to monitor.  Creatinine of 2.5 from  2.6  Generalized weakness with ambulatory dysfunction.  Physical therapy has recommended skilled nursing facility placement.  Patient will follow-up with neurosurgery as outpatient.    Urinary retention/ incontinence History of prostate cancer status post radiation.  Continue supportive care.  External urinary catheter in place.  Elevated troponin likely secondary to  demand ischemia and acute kidney injury.  No acute issues.  Congestive heart failure:   Lasix on hold.  Overall - for 593 mL.  We will discontinue IV fluids at this time.  Patient is a still compensated.  Consider Lasix in 1 to 2 days  History of prostate cancer.  Status post radiation treatment.  Follows up with urology as outpatient.   Bradycardia.   Improved at this time.  EKG showing PVCs and bigeminy pattern.  We will continue to monitor.  Currently on IV metoprolol due to n.p.o. status.  Blood pressure seems to be stable.  Since the patient has been advanced on full liquids we will start metoprolol daily for  History of stroke.  On aspirin  and Plavix at home.  Currently been advanced to full liquids today.  Aspirin and Plavix on hold due to recent bleed.  Moderate protein calorie malnutrition.  Present on admission.  On TPN at this time.  On full liquids as well.  Dietitian on board.  Hypokalemia.  Potassium of 3.2 today.  We will  continue to replenish.  Check levels in a.m.  Hypernatremia.  Improved at this time.  Encourage oral intake.  Improved on D5 water.   Code Status: Full  Family Communication:  Spoke with spouse on 04/26/2021.  Unable to reach the spouse again today.  Disposition Plan:   Status is: Inpatient   Dispo: The patient is from: Home              Anticipated d/c is to: Skilled nursing facility.              Anticipated d/c date is: Likely in 2-3 days              Patient currently not medically stable for discharge  Consultants: General surgery GI  Procedures: NG tube placement PRBC transfusion EGD on 04/28/2021  Antimicrobials: None  DVT prophylaxis: SCD  Subjective  Today, patient was seen and examined at bedside.  Denies any nausea, vomiting abdominal pain.  On clears this morning. Objective: Vitals:   04/29/21 0138 04/29/21 0402 04/29/21 1003 04/29/21 1226  BP: (!) 154/54 (!) 145/68 (!) 144/126 125/63  Pulse:  61 68 (!) 59  Resp:  18 19 16   Temp:  98 F (36.7 C) 97.7 F (36.5 C) 97.8 F (36.6 C)  TempSrc:  Oral Oral   SpO2:  96% 97%   Weight:      Height:        Intake/Output Summary (Last 24 hours) at 04/29/2021 1423 Last data filed at 04/29/2021 1203 Gross per 24 hour  Intake 3044.24 ml  Output 1550 ml  Net 1494.24 ml    Filed Weights   04/27/21 0412 04/28/21 0430 04/29/21 0005  Weight: 89.3 kg 88.9 kg 92.4 kg   Body mass index is 26.88 kg/m.  Physical exam: General:  Average built, not in obvious distress HEENT:   Mild pallor noted.  Oral mucosa is moist.  Chest:   Diminished breath sounds bilaterally.  CVS: S1 &S2 heard. No murmur.  Regular rate and rhythm. Abdomen: Soft, nontender, nondistended.  Bowel sounds are heard.   Extremities: No cyanosis, clubbing or edema.  Peripheral pulses are palpable. Psych: Alert, awake and communicative, normal mood, CNS:  No cranial nerve deficits.  Power equal in all extremities.   Skin: Warm and dry.  No  rashes noted.  Data Reviewed:  CBC: Recent Labs  Lab 04/25/21 0357 04/26/21 0335 04/27/21 0147 04/28/21 0239 04/28/21 1331 04/29/21 0345  WBC 7.3 7.7 7.6 5.7  --  5.9  HGB 7.7* 7.3* 6.3* 6.7* 9.5* 9.4*  HCT 23.7* 23.4* 20.5* 21.5* 28.9* 28.8*  MCV 99.2 101.3* 101.5* 96.8  --  92.6  PLT 188 188 194 124*  --  112*    Basic Metabolic Panel: Recent Labs  Lab 04/23/21 0154 04/24/21 0330 04/25/21 0357 04/26/21 0335 04/27/21 0147 04/28/21 0239 04/28/21 1331 04/29/21 0345  NA 138 143 148* 153* 154* 142 151* 146*  K 3.7 3.6 3.8 3.3* 3.2* 4.7 3.0* 3.2*  CL 106 107 112* 115* 110 102 105 104  CO2 26 27 25 29  34* 36* 36* 34*  GLUCOSE 124* 113* 124* 121* 187* 476* 124* 107*  BUN  37* 38* 53* 50* 52* 52* 48* 46*  CREATININE 2.40* 2.14* 2.04* 2.15* 2.38* 2.72* 2.68* 2.55*  CALCIUM 8.6* 8.8* 8.6* 8.9 8.6* 7.8* 7.8* 8.2*  MG 2.0 2.2 2.3 2.5* 2.5* 2.3  --  2.4  PHOS 3.5 3.4 2.9  --   --  3.6  --  3.7    GFR: Estimated Creatinine Clearance: 25.7 mL/min (A) (by C-G formula based on SCr of 2.55 mg/dL (H)). Liver Function Tests: Recent Labs  Lab 04/23/21 0154 04/27/21 0147 04/28/21 0239 04/29/21 0345  AST 13* 15 13* 16  ALT 11 10 8 9   ALKPHOS 69 48 44 47  BILITOT 0.5 0.6 0.7 0.9  PROT 5.2* 5.0* 4.3* 4.7*  ALBUMIN 2.4* 2.2* 1.9* 2.1*    No results for input(s): LIPASE, AMYLASE in the last 168 hours. No results for input(s): AMMONIA in the last 168 hours. Coagulation Profile: No results for input(s): INR, PROTIME in the last 168 hours. Cardiac Enzymes: No results for input(s): CKTOTAL, CKMB, CKMBINDEX, TROPONINI in the last 168 hours. BNP (last 3 results) No results for input(s): PROBNP in the last 8760 hours. HbA1C: No results for input(s): HGBA1C in the last 72 hours. CBG: Recent Labs  Lab 04/28/21 2001 04/28/21 2359 04/29/21 0358 04/29/21 0721 04/29/21 1111  GLUCAP 204* 132* 99 145* 200*    Lipid Profile: Recent Labs    04/28/21 0239  TRIG 141     Thyroid Function Tests: No results for input(s): TSH, T4TOTAL, FREET4, T3FREE, THYROIDAB in the last 72 hours. Anemia Panel: No results for input(s): VITAMINB12, FOLATE, FERRITIN, TIBC, IRON, RETICCTPCT in the last 72 hours. Urine analysis:    Component Value Date/Time   COLORURINE YELLOW 04/11/2021 0814   APPEARANCEUR HAZY (A) 04/11/2021 0814   APPEARANCEUR Clear 01/01/2021 1448   LABSPEC 1.010 04/11/2021 0814   PHURINE 6.0 04/11/2021 0814   GLUCOSEU NEGATIVE 04/11/2021 0814   HGBUR NEGATIVE 04/11/2021 0814   BILIRUBINUR NEGATIVE 04/11/2021 0814   BILIRUBINUR Negative 01/01/2021 [place] 04/11/2021 0814   PROTEINUR NEGATIVE 04/11/2021 0814   NITRITE NEGATIVE 04/11/2021 0814   LEUKOCYTESUR NEGATIVE 04/11/2021 0814   Sepsis Labs: @LABRCNTIP (procalcitonin:4,lacticidven:4)  ) No results found for this or any previous visit (from the past 240 hour(s)).    Studies: No results found.  Scheduled Meds:  bisacodyl  10 mg Rectal Once   Chlorhexidine Gluconate Cloth  6 each Topical Daily   docusate sodium  100 mg Oral BID   feeding supplement  1 Container Oral BID BM   insulin aspart  0-15 Units Subcutaneous TID WC   metoprolol tartrate  2.5 mg Intravenous Q6H   pantoprazole (PROTONIX) IV  40 mg Intravenous Q12H   polyethylene glycol  17 g Oral Daily   sodium chloride flush  10-40 mL Intracatheter Q12H   sucralfate  1 g Oral TID WC & HS   tamsulosin  0.8 mg Oral QHS    Continuous Infusions:  promethazine (PHENERGAN) injection (IM or IVPB) Stopped (04/21/21 1930)   TPN ADULT (ION) 40 mL/hr at 04/28/21 1746     LOS: 19 days    Flora Lipps, MD [facility] 04/29/2021, 2:23 PM

## 2021-04-30 LAB — BASIC METABOLIC PANEL
Anion gap: 8 (ref 5–15)
BUN: 42 mg/dL — ABNORMAL HIGH (ref 8–23)
CO2: 31 mmol/L (ref 22–32)
Calcium: 8.2 mg/dL — ABNORMAL LOW (ref 8.9–10.3)
Chloride: 103 mmol/L (ref 98–111)
Creatinine, Ser: 2.35 mg/dL — ABNORMAL HIGH (ref 0.61–1.24)
GFR, Estimated: 27 mL/min — ABNORMAL LOW (ref >60)
Glucose, Bld: 110 mg/dL — ABNORMAL HIGH (ref 70–99)
Potassium: 3.5 mmol/L (ref 3.5–5.1)
Sodium: 142 mmol/L (ref 135–145)

## 2021-04-30 LAB — CBC
HCT: 27.9 % — ABNORMAL LOW (ref 39.0–52.0)
Hemoglobin: 9.1 g/dL — ABNORMAL LOW (ref 13.0–17.0)
MCH: 30.2 pg (ref 26.0–34.0)
MCHC: 32.6 g/dL (ref 30.0–36.0)
MCV: 92.7 fL (ref 80.0–100.0)
Platelets: 102 10*3/uL — ABNORMAL LOW (ref 150–400)
RBC: 3.01 MIL/uL — ABNORMAL LOW (ref 4.22–5.81)
RDW: 16.1 % — ABNORMAL HIGH (ref 11.5–15.5)
WBC: 8.4 10*3/uL (ref 4.0–10.5)
nRBC: 0 % (ref 0.0–0.2)

## 2021-04-30 LAB — GLUCOSE, CAPILLARY
Glucose-Capillary: 100 mg/dL — ABNORMAL HIGH (ref 70–99)
Glucose-Capillary: 118 mg/dL — ABNORMAL HIGH (ref 70–99)
Glucose-Capillary: 174 mg/dL — ABNORMAL HIGH (ref 70–99)
Glucose-Capillary: 198 mg/dL — ABNORMAL HIGH (ref 70–99)
Glucose-Capillary: 236 mg/dL — ABNORMAL HIGH (ref 70–99)
Glucose-Capillary: 94 mg/dL (ref 70–99)

## 2021-04-30 LAB — MAGNESIUM: Magnesium: 2.2 mg/dL (ref 1.7–2.4)

## 2021-04-30 MED ORDER — ADULT MULTIVITAMIN W/MINERALS CH
1.0000 | ORAL_TABLET | Freq: Every day | ORAL | Status: DC
  Administered 2021-04-30 – 2021-05-03 (×4): 1 via ORAL
  Filled 2021-04-30 (×5): qty 1

## 2021-04-30 MED ORDER — PANTOPRAZOLE SODIUM 40 MG PO TBEC
40.0000 mg | DELAYED_RELEASE_TABLET | Freq: Two times a day (BID) | ORAL | Status: DC
  Administered 2021-04-30 – 2021-05-03 (×7): 40 mg via ORAL
  Filled 2021-04-30 (×8): qty 1

## 2021-04-30 MED ORDER — ENSURE ENLIVE PO LIQD
237.0000 mL | Freq: Two times a day (BID) | ORAL | Status: DC
  Administered 2021-05-01 – 2021-05-03 (×4): 237 mL via ORAL

## 2021-04-30 NOTE — Progress Notes (Signed)
Physical Therapy Treatment Patient Details Name: Thomas Duarte MRN: [id number] DOB: [birth year] Today's Date: 04/30/2021   History of Present Illness Pt is an 80 y.o. male admitted 04/10/21 with c/o worsening BLE weakness, urinary incontinence; of note, pt recently started prostate CA treatment (completed 4 wks of radiation 2 wks prior). Workup for AKI on CKD 3. Lumbar MRI showed degenerative disease. 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    PT Comments    Pt supine in bed on entry, eager to work with therapy but apprehensive about level of weakness. Pt is limited in safe mobility by decreased awareness and need for multimodal sequencing cues in presence of decreased strength, balance and endurance. Pt currently min Ax2 for bed mobility with increased cuing, modAx2 for transfers and min A for steadying with very short distance ambulation with RW. D/c plans remain appropriate at this time. PT will continue to follow acutely.    Recommendations for follow up therapy are one component of a multi-disciplinary discharge planning process, led by the attending physician.  Recommendations may be updated based on patient status, additional functional criteria and insurance authorization.  Follow Up Recommendations  SNF;Supervision for mobility/OOB     Equipment Recommendations  None recommended by PT       Precautions / Restrictions Precautions Precautions: Fall Precaution Comments: NG tube Restrictions Weight Bearing Restrictions: No     Mobility  Bed Mobility Overal bed mobility: Needs Assistance Bed Mobility: Supine to Sit     Supine to sit: HOB elevated;Min assist     General bed mobility comments: min A and maximal cuing for sequencing to come to EoB, able to self steady    Transfers Overall transfer level: Needs assistance Equipment used: 1  person hand held assist;Rolling walker (2 wheeled);2 person hand held assist Transfers: Sit to/from [facility] Sit to Stand: Mod assist;+2 physical assistance         General transfer comment: modAx2 for power up to RW, pt with ability to clear hips, requiring increased effort to bring hand to RW and trunk to upright from both elevated bed and lower recliner  Ambulation/Gait Ambulation/Gait assistance: Min assist;+2 safety/equipment Gait Distance (Feet): 3 Feet (+8) Assistive device: Rolling walker (2 wheeled) Gait Pattern/deviations: Step-to pattern;Decreased step length - right;Decreased step length - left;Shuffle;Trunk flexed Gait velocity: Decreased Gait velocity interpretation: <1.31 ft/sec, indicative of household ambulator General Gait Details: pt found to need pericare upon standing, able to ambulate over to recliner to sit while materials obtained, once in standing pt able to stand with min A for pericare and then ambulate 8 feet with close chair follow          Balance Overall balance assessment: Needs assistance Sitting-balance support: No upper extremity supported;Feet supported Sitting balance-Leahy Scale: Good     Standing balance support: Bilateral upper extremity supported;Single extremity supported;During functional activity Standing balance-Leahy Scale: Poor Standing balance comment: Reliant on UE support, and min A                            Cognition Arousal/Alertness: Awake/alert Behavior During Therapy: WFL for tasks assessed/performed Overall Cognitive Status: No family/caregiver present to determine baseline cognitive functioning Area of Impairment: Awareness;Problem solving;Safety/judgement  Following Commands: Follows one step commands consistently;Follows one step commands with increased time Safety/Judgement: Decreased awareness of safety;Decreased awareness of deficits Awareness:  Emergent Problem Solving: Slow processing;Requires verbal cues;Difficulty sequencing General Comments: pt much more awake and participatory today         General Comments General comments (skin integrity, edema, etc.): VSS on RA, pt eager to get up with therapy, but apprehensive about his current weakness      Pertinent Vitals/Pain Pain Assessment: No/denies pain Faces Pain Scale: No hurt     PT Goals (current goals can now be found in the care plan section) Acute Rehab PT Goals Patient Stated Goal: to improve strength and return to independence PT Goal Formulation: With patient Time For Goal Achievement: 04/26/21 Potential to Achieve Goals: Good Progress towards PT goals: Progressing toward goals    Frequency    Min 3X/week      PT Plan Current plan remains appropriate       AM-PAC PT "6 Clicks" Mobility   Outcome Measure  Help needed turning from your back to your side while in a flat bed without using bedrails?: None Help needed moving from lying on your back to sitting on the side of a flat bed without using bedrails?: A Little Help needed moving to and from a bed to a chair (including a wheelchair)?: A Little Help needed standing up from a chair using your arms (e.g., wheelchair or bedside chair)?: A Little Help needed to walk in hospital room?: A Little Help needed climbing 3-5 steps with a railing? : A Lot 6 Click Score: 18    End of Session Equipment Utilized During Treatment: Gait belt Activity Tolerance: Patient tolerated treatment well Patient left: with call bell/phone within reach;in chair;with chair alarm set Nurse Communication: Mobility status PT Visit Diagnosis: Other abnormalities of gait and mobility (R26.89);Muscle weakness (generalized) (M62.81)     Time: 0932-3557 PT Time Calculation (min) (ACUTE ONLY): 23 min  Charges:  $Therapeutic Exercise: 8-22 mins $Therapeutic Activity: 8-22 mins                     Valeria Krisko B. Migdalia Dk PT,  DPT Acute Rehabilitation Services Pager [phone] Office [phone]    [place] 04/30/2021, 1:26 PM

## 2021-04-30 NOTE — Plan of Care (Signed)
  Problem: Education: Goal: Knowledge of General Education information will improve Description: Including pain rating scale, medication(s)/side effects and non-pharmacologic comfort measures Outcome: Progressing   Problem: Health Behavior/Discharge Planning: Goal: Ability to manage health-related needs will improve Outcome: Progressing   Problem: Clinical Measurements: Goal: Ability to maintain clinical measurements within normal limits will improve Outcome: Progressing Goal: Will remain free from infection Outcome: Progressing Goal: Diagnostic test results will improve Outcome: Progressing Goal: Respiratory complications will improve Outcome: Progressing Goal: Cardiovascular complication will be avoided Outcome: Progressing   Problem: Activity: Goal: Risk for activity intolerance will decrease Outcome: Progressing   Problem: Elimination: Goal: Will not experience complications related to bowel motility Outcome: Progressing   Problem: Skin Integrity: Goal: Risk for impaired skin integrity will decrease Outcome: Progressing

## 2021-04-30 NOTE — Progress Notes (Signed)
Nutrition Follow-up  DOCUMENTATION CODES:   Non-severe (moderate) malnutrition in context of chronic illness  INTERVENTION:   -D/c Boost Breze -Ensure Enlive po BID, each supplement provides 350 kcal and 20 grams of protein  -MVI with minerals daily  NUTRITION DIAGNOSIS:   Moderate Malnutrition related to chronic illness as evidenced by mild fat depletion, mild muscle depletion.  Ongoing  GOAL:   Patient will meet greater than or equal to 90% of their needs  Progressing   MONITOR:   PO intake, Supplement acceptance  REASON FOR ASSESSMENT:   Consult New TPN/TNA  ASSESSMENT:   Pt admitted for BLE weakness; pt began to have nausea and vomiting on 9/5, determined to have a small bowel obstruction. PMH of CHF, T2DM, CKD III, HtN, and prostate cancer (post radiation).  9/6: NG placed for LIS 9/11: PICC placed 9/12: EGD (findings: 2 gastric ulcers and esophagitis), NG removed, advanced to full liquid diet 9/13:TPN d/c 9/14" advanced to regular diet  Reviewed I/O's: -213 ml x 24 hours and -2.5 L since 04/16/21  UOP: 1.8 L x 24 hours  Pt sleeping soundly at time of visit.   Pt with improved oral intake. Noted meal completions 25-100%. Pt is consuming Boost Breeze supplements per MAR.     Per TOC notes, plan to d/c to SNF once medically stable.   Medications reviewed and include colace and miralax.   Labs reviewed: CBGS: 100-174 (inpatient orders for glycemic control are 0-15 units insulin aspart TID with meals).    Diet Order:   Diet Order             Diet regular Room service appropriate? Yes; Fluid consistency: Thin  Diet effective now           Diet - low sodium heart healthy                   EDUCATION NEEDS:   No education needs have been identified at this time  Skin:  Skin Assessment: Reviewed RN Assessment  Last BM:  04/29/21  Height:   Ht Readings from Last 1 Encounters:  04/10/21 6\' 1"  (1.854 m)    Weight:   Wt Readings from Last  1 Encounters:  04/30/21 94.3 kg    Ideal Body Weight:  83.6 kg  BMI:  Body mass index is 27.43 kg/m.  Estimated Nutritional Needs:   Kcal:  2200-2400  Protein:  110-120 gm  Fluid:  > 2.2 L    Loistine Chance, RD, LDN, [place] Registered Dietitian II Certified Diabetes Care and Education Specialist Please refer to [facility] for RD and/or RD on-call/weekend/after hours pager

## 2021-04-30 NOTE — Progress Notes (Signed)
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  X-ray of the abdomen showed some ileus-like features.  General surgery was consulted.  Patient underwent conservative treatment for partial small bowel obstruction.TPN was initiated.  Barium follow-through showed  contrast in the colon.   Patient was then advised full liquids today.  [place] hospitalization, patient underwent EGD due to bloody NG aspirate and was noted to have gastric ulcers, esophagitis and NG tube trauma.  Patient is currently awaiting for skilled nursing facility placement.  We will need to wean TPN and advance diet prior to discharge.  Assessment/Plan:  Principal Problem:   AKI (acute kidney injury) ([place]) Active Problems:   Prostate cancer ([place])   Hypertension   General weakness   Diabetes mellitus without complication (H[facility])   Malnutrition of moderate degree   Partial small bowel obstruction likely ileus/esophagitis/nonbleeding gastric ulcers General surgery on board.  Improved with conservative treatment.  Patient initially had NG tube for gastric decompression.  Subsequently, patient started having bloody NG aspirate and GI was consulted.  EGD done showed nonbleeding gastric ulcers and esophagitis with NG tube trauma.  Patient was started on TPN due to prolonged ileus.  Now passing flatus and had 2 BMs this morning.  Tolerating diet.  Surgery advance to regular diet, TPN and stopped.  And is cleared by surgery to be discharged.  Patient remains on twice daily p.o. Protonix 40  mg.  Acute blood loss anemia secondary to upper GI bleed likely from severe esophagitis and gastric ulcers with NG tube trauma.    On IV Protonix twice daily.  received 3 units of packed RBCs so far.  Status post EGD 04/28/2021 with findings as mentioned above with GI recommends proton pump inhibitor twice daily for 8 weeks and sucralfate 1 g p.o. 4 times daily for 4 weeks.  Serum for H. pylori pending.  Treat for H. pylori if positive.  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  Monitor.  Generalized weakness with ambulatory dysfunction.  Physical therapy has recommended skilled nursing facility placement.  Patient will follow-up with neurosurgery as outpatient.    Urinary retention/ incontinence History of prostate cancer status post radiation.  Continue supportive care.  External urinary catheter in place.  No issues with voiding.  Elevated troponin likely secondary to  demand ischemia and acute kidney injury.  No acute issues.  Congestive heart failure:   Lasix on hold.  Patient is a still compensated.  Consider Lasix in 1 to 2 days once his p.o. intake picks up.  History of prostate cancer.  Status post radiation treatment.  Follows up with urology as outpatient.   Bradycardia.   Improved at this time.  EKG showing PVCs and bigeminy pattern.  We will continue to monitor.  Currently on Toprol-XL 25 mg daily blood pressure seems to be stable.   History of stroke.  On aspirin and Plavix at home.  Currently been advanced to full liquids today.  Aspirin and Plavix on hold due to recent bleed.  EGD completed on 04/28/2021.  We will hold antiplatelet until 05/05/2021 and  likely resume slowly if hemoglobin remained stable  Moderate protein calorie malnutrition.  Dietitian on board  Hypokalemia.  Resolved  Hypernatremia.  Resolved.  Code Status: Full  Family Communication:  None.  Patient alert and oriented   Disposition Plan:   Status is:  Inpatient   Dispo: The patient is from: Home              Anticipated d/c is to: Skilled nursing facility.              Anticipated d/c date is: Likely in 2-3 days, insurance authorization pending.              Patient currently medically stable for discharge  Consultants: General surgery GI  Procedures: NG tube placement PRBC transfusion EGD on 04/28/2021  Antimicrobials: None  DVT prophylaxis: SCD  Subjective  Seen and examined.  He has no complaints at all.  He is fully alert and oriented.  Objective: Vitals:   04/29/21 1700 04/29/21 2017 04/30/21 0444 04/30/21 1235  BP: (!) 153/67 (!) 150/72 (!) 145/66 (!) 126/57  Pulse: 73 63 64 69  Resp: 14 20 20 16   Temp: 98.3 F (36.8 C) 97.9 F (36.6 C) 97.9 F (36.6 C) 98.2 F (36.8 C)  TempSrc: Oral Oral Oral Oral  SpO2: 100% 100% 97%   Weight:   94.3 kg   Height:        Intake/Output Summary (Last 24 hours) at 04/30/2021 1346 Last data filed at 04/30/2021 1300 Gross per 24 hour  Intake 800 ml  Output 1850 ml  Net -1050 ml    Filed Weights   04/28/21 0430 04/29/21 0005 04/30/21 0444  Weight: 88.9 kg 92.4 kg 94.3 kg   Body mass index is 27.43 kg/m.  Physical exam: General exam: Appears calm and comfortable  Respiratory system: Clear to auscultation. Respiratory effort normal. Cardiovascular system: S1 & S2 heard, RRR. No JVD, murmurs, rubs, gallops or clicks. No pedal edema. Gastrointestinal system: Abdomen is nondistended, soft and nontender. No organomegaly or masses felt. Normal bowel sounds heard. Central nervous system: Alert and oriented. No focal neurological deficits. Extremities: Symmetric 5 x 5 power. Skin: No rashes, lesions or ulcers.  Psychiatry: Judgement and insight appear normal. Mood & affect appropriate.    Data Reviewed:  CBC: Recent Labs  Lab 04/26/21 0335 04/27/21 0147 04/28/21 0239 04/28/21 1331 04/29/21 0345 04/30/21 0334  WBC 7.7 7.6 5.7  --  5.9 8.4  HGB 7.3* 6.3* 6.7*  9.5* 9.4* 9.1*  HCT 23.4* 20.5* 21.5* 28.9* 28.8* 27.9*  MCV 101.3* 101.5* 96.8  --  92.6 92.7  PLT 188 194 124*  --  112* 102*    Basic Metabolic Panel: Recent Labs  Lab 04/24/21 0330 04/25/21 0357 04/26/21 0335 04/27/21 0147 04/28/21 0239 04/28/21 1331 04/29/21 0345 04/30/21 0334  NA 143 148* 153* 154* 142 151* 146* 142  K 3.6 3.8 3.3* 3.2* 4.7 3.0* 3.2* 3.5  CL 107 112* 115* 110 102 105 104 103  CO2 27 25 29  34* 36* 36* 34* 31  GLUCOSE 113* 124* 121* 187* 476* 124* 107* 110*  BUN 38* 53* 50* 52* 52* 48* 46* 42*  CREATININE 2.14* 2.04* 2.15* 2.38* 2.72* 2.68* 2.55* 2.35*  CALCIUM 8.8* 8.6* 8.9 8.6* 7.8* 7.8* 8.2* 8.2*  MG 2.2 2.3 2.5* 2.5* 2.3  --  2.4 2.2  PHOS 3.4 2.9  --   --  3.6  --  3.7  --     GFR: Estimated Creatinine Clearance: 27.9  mL/min (A) (by C-G formula based on SCr of 2.35 mg/dL (H)). Liver Function Tests: Recent Labs  Lab 04/27/21 0147 04/28/21 0239 04/29/21 0345  AST 15 13* 16  ALT 10 8 9   ALKPHOS 48 44 47  BILITOT 0.6 0.7 0.9  PROT 5.0* 4.3* 4.7*  ALBUMIN 2.2* 1.9* 2.1*    No results for input(s): LIPASE, AMYLASE in the last 168 hours. No results for input(s): AMMONIA in the last 168 hours. Coagulation Profile: No results for input(s): INR, PROTIME in the last 168 hours. Cardiac Enzymes: No results for input(s): CKTOTAL, CKMB, CKMBINDEX, TROPONINI in the last 168 hours. BNP (last 3 results) No results for input(s): PROBNP in the last 8760 hours. HbA1C: No results for input(s): HGBA1C in the last 72 hours. CBG: Recent Labs  Lab 04/29/21 2019 04/30/21 0025 04/30/21 0445 04/30/21 0718 04/30/21 1144  GLUCAP 135* 100* 118* 174* 198*    Lipid Profile: Recent Labs    04/28/21 0239  TRIG 141    Thyroid Function Tests: No results for input(s): TSH, T4TOTAL, FREET4, T3FREE, THYROIDAB in the last 72 hours. Anemia Panel: No results for input(s): VITAMINB12, FOLATE, FERRITIN, TIBC, IRON, RETICCTPCT in the last 72 hours. Urine  analysis:    Component Value Date/Time   COLORURINE YELLOW 04/11/2021 0814   APPEARANCEUR HAZY (A) 04/11/2021 0814   APPEARANCEUR Clear 01/01/2021 1448   LABSPEC 1.010 04/11/2021 0814   PHURINE 6.0 04/11/2021 0814   GLUCOSEU NEGATIVE 04/11/2021 0814   HGBUR NEGATIVE 04/11/2021 0814   BILIRUBINUR NEGATIVE 04/11/2021 0814   BILIRUBINUR Negative 01/01/2021 [place] 04/11/2021 0814   PROTEINUR NEGATIVE 04/11/2021 0814   NITRITE NEGATIVE 04/11/2021 0814   LEUKOCYTESUR NEGATIVE 04/11/2021 0814   Sepsis Labs: @LABRCNTIP (procalcitonin:4,lacticidven:4)  ) No results found for this or any previous visit (from the past 240 hour(s)).    Studies: No results found.  Scheduled Meds:  Chlorhexidine Gluconate Cloth  6 each Topical Daily   docusate sodium  100 mg Oral BID   feeding supplement  237 mL Oral BID BM   insulin aspart  0-15 Units Subcutaneous TID WC   metoprolol succinate  25 mg Oral Daily   multivitamin with minerals  1 tablet Oral Daily   pantoprazole  40 mg Oral BID   polyethylene glycol  17 g Oral Daily   sodium chloride flush  10-40 mL Intracatheter Q12H   sucralfate  1 g Oral TID WC & HS   tamsulosin  0.8 mg Oral QHS    Continuous Infusions:  promethazine (PHENERGAN) injection (IM or IVPB) Stopped (04/21/21 1930)     LOS: 20 days   Darliss Cheney, MD [facility] 04/30/2021, 1:46 PM

## 2021-04-30 NOTE — Progress Notes (Signed)
    2 Days Post-Op  Subjective: CC: Doing well. No abdominal pain. Tolerating diet without n/v. Passing flatus. 2 BM's yesterday.   Objective: Vital signs in last 24 hours: Temp:  [97.7 F (36.5 C)-98.3 F (36.8 C)] 97.9 F (36.6 C) (09/14 0444) Pulse Rate:  [59-73] 64 (09/14 0444) Resp:  [14-20] 20 (09/14 0444) BP: (125-153)/(63-126) 145/66 (09/14 0444) SpO2:  [97 %-100 %] 97 % (09/14 0444) Weight:  [94.3 kg] 94.3 kg (09/14 0444) Last BM Date: 04/24/21  Intake/Output from previous day: 09/13 0701 - 09/14 0700 In: 1536.9 [P.O.:1080; I.V.:56.9; IV Piggyback:400] Out: 1750 [Urine:1750] Intake/Output this shift: No intake/output data recorded.  PE: Gen:  Alert, NAD, pleasant Abd: Soft, minimal to no distension, NT, +BS.  Skin: no rashes noted, warm and dry  Lab Results:  Recent Labs    04/29/21 0345 04/30/21 0334  WBC 5.9 8.4  HGB 9.4* 9.1*  HCT 28.8* 27.9*  PLT 112* 102*   BMET Recent Labs    04/29/21 0345 04/30/21 0334  NA 146* 142  K 3.2* 3.5  CL 104 103  CO2 34* 31  GLUCOSE 107* 110*  BUN 46* 42*  CREATININE 2.55* 2.35*  CALCIUM 8.2* 8.2*   PT/INR No results for input(s): LABPROT, INR in the last 72 hours. CMP     Component Value Date/Time   NA 142 04/30/2021 0334   NA 141 09/09/2020 1617   K 3.5 04/30/2021 0334   CL 103 04/30/2021 0334   CO2 31 04/30/2021 0334   GLUCOSE 110 (H) 04/30/2021 0334   BUN 42 (H) 04/30/2021 0334   BUN 39 (H) 09/09/2020 1617   CREATININE 2.35 (H) 04/30/2021 0334   CALCIUM 8.2 (L) 04/30/2021 0334   PROT 4.7 (L) 04/29/2021 0345   ALBUMIN 2.1 (L) 04/29/2021 0345   AST 16 04/29/2021 0345   ALT 9 04/29/2021 0345   ALKPHOS 47 04/29/2021 0345   BILITOT 0.9 04/29/2021 0345   GFRNONAA 27 (L) 04/30/2021 0334   GFRAA 40 (L) 09/09/2020 1617   Lipase     Component Value Date/Time   LIPASE 42 04/10/2021 1438    Studies/Results: No results found.  Anti-infectives: Anti-infectives (From admission, onward)    None         Assessment/Plan SBO - CT 9/6 w/ sbo w/ transition in the mid abdomen. He has remote hx of L inguinal hernia repair and ex lap, loa and meckle's diverticulectomy.  - xray 9/10 with contrast into the distal colon.  - Clinically improving. Tolerating diet without n/v or abdominal pain. Continues to pass flatus and had 2 bm's yesterday - We will sign off. Discussed with TRH. Please call back if we can be of any assistance.   FEN - Reg + shakes, bowel regimen, off TPN VTE - per TRH ID - none   - Per TRH -  ABL anemia - UGI bleed noted and GI consulted 9/11. EGD 9/12. On PPI. Per GI. Hgb stable  Acute on chronic CKD Hx CHF DM CVAs Prostate cancer HTN Urinary incontinence   LOS: 20 days    Jillyn Ledger , [facility] 04/30/2021, 8:59 AM Please see Amion for pager number during day hours 7:00am-4:30pm

## 2021-05-01 LAB — BPAM RBC
Blood Product Expiration Date: 202210022359
Blood Product Expiration Date: 202210052359
Blood Product Expiration Date: 202210062359
Blood Product Expiration Date: 202210062359
Blood Product Expiration Date: 202210062359
ISSUE DATE / TIME: 202209111146
ISSUE DATE / TIME: 202209112211
ISSUE DATE / TIME: 202209120449
ISSUE DATE / TIME: 202209120831
Unit Type and Rh: 6200
Unit Type and Rh: 6200
Unit Type and Rh: 6200
Unit Type and Rh: 6200
Unit Type and Rh: 6200

## 2021-05-01 LAB — TYPE AND SCREEN
ABO/RH(D): A POS
Antibody Screen: NEGATIVE
Unit division: 0
Unit division: 0
Unit division: 0
Unit division: 0
Unit division: 0

## 2021-05-01 LAB — GLUCOSE, CAPILLARY
Glucose-Capillary: 165 mg/dL — ABNORMAL HIGH (ref 70–99)
Glucose-Capillary: 183 mg/dL — ABNORMAL HIGH (ref 70–99)
Glucose-Capillary: 129 mg/dL — ABNORMAL HIGH (ref 70–99)

## 2021-05-01 NOTE — Plan of Care (Signed)
  Problem: Education: Goal: Knowledge of General Education information will improve Description: Including pain rating scale, medication(s)/side effects and non-pharmacologic comfort measures Outcome: Progressing   Problem: Health Behavior/Discharge Planning: Goal: Ability to manage health-related needs will improve Outcome: Progressing   Problem: Clinical Measurements: Goal: Ability to maintain clinical measurements within normal limits will improve Outcome: Progressing Goal: Will remain free from infection Outcome: Progressing Goal: Diagnostic test results will improve Outcome: Progressing Goal: Respiratory complications will improve Outcome: Progressing Goal: Cardiovascular complication will be avoided Outcome: Progressing   Problem: Activity: Goal: Risk for activity intolerance will decrease Outcome: Progressing   Problem: Elimination: Goal: Will not experience complications related to bowel motility Outcome: Progressing   Problem: Skin Integrity: Goal: Risk for impaired skin integrity will decrease Outcome: Progressing

## 2021-05-01 NOTE — Progress Notes (Signed)
Occupational Therapy Treatment Patient Details Name: Thomas Duarte MRN: [id number] DOB: 1941-02-12 Today's Date: 05/01/2021   History of present illness Pt is an 80 y.o. male admitted 04/10/21 with c/o worsening BLE weakness, urinary incontinence; of note, pt recently started prostate CA treatment (completed 4 wks of radiation 2 wks prior). Workup for AKI on CKD 3. Lumbar MRI showed degenerative disease. 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   OT comments  Pt noted with functional decline, requiring increased assist for ADLs and transfers today. Pt limited by fatigue, inability to fully extend B LE for safe standing, and orthostatic hypotension. Pt overall Mod A for bed mobility, Max A for unsuccessful standing attempts with RW and Max A x 2 for squat pivot to chair from soiled bed. Pt required Max A x 2 for LB bathing in standing due to limitations noted above. Pt left with B LE elevated and slightly reclined with alarm active. Continue to recommend SNF rehab.  BP lying: 122/58 BP sitting: 123/73 BP after transfer: 102/60   Recommendations for follow up therapy are one component of a multi-disciplinary discharge planning process, led by the attending physician.  Recommendations may be updated based on patient status, additional functional criteria and insurance authorization.    Follow Up Recommendations  SNF;Supervision/Assistance - 24 hour    Equipment Recommendations  Tub/shower seat;Other (comment) (Rolling walker)    Recommendations for Other Services      Precautions / Restrictions Precautions Precautions: Fall Precaution Comments: monitor orthostatic BP Restrictions Weight Bearing Restrictions: No       Mobility Bed Mobility Overal bed mobility: Needs Assistance Bed Mobility: Supine to Sit     Supine to sit: Mod assist;HOB elevated      General bed mobility comments: Heavy assist to scoot hips forward, light assist to lift trunk to upright position with pt reaching to footboard to gain balance    Transfers Overall transfer level: Needs assistance Equipment used: Rolling walker (2 wheeled);None Transfers: Sit to/from [employer] Sit to Stand: Max assist;+2 physical assistance;+2 safety/equipment   Squat pivot transfers: Max assist;+2 physical assistance;+2 safety/equipment     General transfer comment: Trialed multiple attempts with elevated bed for sit to stand with RW - unable to complete with 1 person assist and noted pt with difficulty extending B knees to acheive upright position. Opted for squat pivot at max A with RN entering to provide +2 assist for swift transfer to chair    Balance Overall balance assessment: Needs assistance Sitting-balance support: No upper extremity supported;Feet supported Sitting balance-Leahy Scale: Fair     Standing balance support: Bilateral upper extremity supported;Single extremity supported;During functional activity Standing balance-Leahy Scale: Poor Standing balance comment: Reliant on UE support & heavy external assist                           ADL either performed or assessed with clinical judgement   ADL Overall ADL's : Needs assistance/impaired Eating/Feeding: Set up;Sitting       Upper Body Bathing: Minimal assistance;Sitting Upper Body Bathing Details (indicate cue type and reason): to bathe back and for thoroughness, limited by fatigue Lower Body Bathing: Maximal assistance;+2 for physical assistance;+2 for safety/equipment;Sit to/from stand Lower Body Bathing Details (indicate cue type and reason): Pt able to assist in bathing upper LEs sitting EOB. Assist needed to bathe B  feet and +2 assist in standing for posterior hygiene after BM Upper Body Dressing : Minimal assistance;Sitting Upper Body Dressing Details (indicate cue type and  reason): to don new gown, fatigued Lower Body Dressing: Maximal assistance;Sit to/from stand;+2 for safety/equipment;+2 for physical assistance Lower Body Dressing Details (indicate cue type and reason): Unable to cross LEs today or functionally reach to B feet. Donned around toes to assist in initiating task though pt unable to pull over heel               General ADL Comments: Noted declined from previous OT sessions, requiring +2 assist for standing and transfers. Noted orthostatic BP during session     Vision   Vision Assessment?: No apparent visual deficits   Perception     Praxis      Cognition Arousal/Alertness: Awake/alert Behavior During Therapy: WFL for tasks assessed/performed Overall Cognitive Status: No family/caregiver present to determine baseline cognitive functioning Area of Impairment: Awareness;Problem solving;Safety/judgement                         Safety/Judgement: Decreased awareness of safety;Decreased awareness of deficits Awareness: Emergent Problem Solving: Slow processing;Requires verbal cues;Difficulty sequencing General Comments: pt remains pleasant and participatory though notably more flat affect and functional decline noted. increased time/effort to follow directions and initiate tasks        Exercises     Shoulder Instructions       General Comments Endorses lightheadedness and dizziness that progressed after OOB activity    Pertinent Vitals/ Pain       Pain Assessment: No/denies pain  Home Living                                          Prior Functioning/Environment              Frequency  Min 2X/week        Progress Toward Goals  OT Goals(current goals can now be found in the care plan section)  Progress towards OT goals: Not progressing toward goals - comment (noted decline today, increased assist needed)  Acute Rehab OT Goals Patient Stated Goal: to improve strength and return to  independence OT Goal Formulation: With patient Time For Goal Achievement: 05/09/21 Potential to Achieve Goals: Good ADL Goals Pt Will Perform Grooming: standing;with min guard assist Pt Will Perform Lower Body Dressing: with min assist;with adaptive equipment;sit to/from stand Pt Will Transfer to Toilet: with min guard assist;regular height toilet;bedside commode;ambulating Pt Will Perform Toileting - Clothing Manipulation and hygiene: with min guard assist;sit to/from stand Pt/caregiver will Perform Home Exercise Program: Increased strength;Both right and left upper extremity;With theraband;Independently;With written HEP provided Additional ADL Goal #1: Pt to complete bed mobility with supervision as precursor to ADLs  Plan Discharge plan remains appropriate;Frequency remains appropriate    Co-evaluation                 AM-PAC OT "6 Clicks" Daily Activity     Outcome Measure   Help from another person eating meals?: A Little Help from another person taking care of personal grooming?: A Little Help from another person toileting, which includes using toliet, bedpan, or urinal?: Total Help from another person bathing (including washing, rinsing, drying)?: A Lot Help from another person to put on and taking off regular upper body clothing?: A Little Help from another person to  put on and taking off regular lower body clothing?: A Lot 6 Click Score: 14    End of Session Equipment Utilized During Treatment: Gait belt;Rolling walker  OT Visit Diagnosis: Other abnormalities of gait and mobility (R26.89);Muscle weakness (generalized) (M62.81)   Activity Tolerance Patient limited by fatigue   Patient Left in chair;with call bell/phone within reach;with chair alarm set   Nurse Communication Mobility status;Other (comment) (BP)        Time: 2583-4621 OT Time Calculation (min): 41 min  Charges: OT General Charges $OT Visit: 1 Visit OT Treatments $Self Care/Home Management :  23-37 mins $Therapeutic Activity: 8-22 mins  Malachy Chamber, OTR/L Acute Rehab Services Office: [phone]   Layla Maw 05/01/2021, 10:36 AM

## 2021-05-02 ENCOUNTER — Inpatient Hospital Stay (HOSPITAL_COMMUNITY): Payer: Medicare HMO

## 2021-05-02 LAB — GLUCOSE, CAPILLARY
Glucose-Capillary: 113 mg/dL — ABNORMAL HIGH (ref 70–99)
Glucose-Capillary: 118 mg/dL — ABNORMAL HIGH (ref 70–99)
Glucose-Capillary: 122 mg/dL — ABNORMAL HIGH (ref 70–99)
Glucose-Capillary: 122 mg/dL — ABNORMAL HIGH (ref 70–99)
Glucose-Capillary: 157 mg/dL — ABNORMAL HIGH (ref 70–99)
Glucose-Capillary: 158 mg/dL — ABNORMAL HIGH (ref 70–99)
Glucose-Capillary: 178 mg/dL — ABNORMAL HIGH (ref 70–99)
Glucose-Capillary: 188 mg/dL — ABNORMAL HIGH (ref 70–99)

## 2021-05-02 LAB — SARS CORONAVIRUS 2 (TAT 6-24 HRS): SARS Coronavirus 2: NEGATIVE

## 2021-05-02 LAB — H PYLORI, IGM, IGG, IGA AB
H Pylori IgG: 0.76 Index Value (ref 0.00–0.79)
H. Pylogi, Iga Abs: 12.5 units — ABNORMAL HIGH (ref 0.0–8.9)
H. Pylogi, Igm Abs: <9 units (ref 0.0–8.9)

## 2021-05-02 NOTE — Plan of Care (Signed)
  Problem: Education: Goal: Knowledge of General Education information will improve Description: Including pain rating scale, medication(s)/side effects and non-pharmacologic comfort measures 05/02/2021 1831 by Augusto Garbe, RN Outcome: Progressing 05/02/2021 1828 by Augusto Garbe, RN Outcome: Progressing   Problem: Health Behavior/Discharge Planning: Goal: Ability to manage health-related needs will improve 05/02/2021 1831 by Augusto Garbe, RN Outcome: Progressing 05/02/2021 1828 by Augusto Garbe, RN Outcome: Progressing   Problem: Clinical Measurements: Goal: Ability to maintain clinical measurements within normal limits will improve 05/02/2021 1831 by Augusto Garbe, RN Outcome: Progressing 05/02/2021 1828 by Augusto Garbe, RN Outcome: Progressing Goal: Will remain free from infection 05/02/2021 1831 by Augusto Garbe, RN Outcome: Progressing 05/02/2021 1828 by Augusto Garbe, RN Outcome: Progressing Goal: Diagnostic test results will improve 05/02/2021 1831 by Augusto Garbe, RN Outcome: Progressing 05/02/2021 1828 by Augusto Garbe, RN Outcome: Progressing Goal: Respiratory complications will improve 05/02/2021 1831 by Augusto Garbe, RN Outcome: Progressing 05/02/2021 1828 by Augusto Garbe, RN Outcome: Progressing Goal: Cardiovascular complication will be avoided 05/02/2021 1831 by Augusto Garbe, RN Outcome: Progressing 05/02/2021 1828 by Augusto Garbe, RN Outcome: Progressing   Problem: Activity: Goal: Risk for activity intolerance will decrease 05/02/2021 1831 by Augusto Garbe, RN Outcome: Progressing 05/02/2021 1828 by Augusto Garbe, RN Outcome: Progressing   Problem: Elimination: Goal: Will not experience complications related to bowel motility 05/02/2021 1831 by Augusto Garbe, RN Outcome: Progressing 05/02/2021 1828 by Augusto Garbe, RN Outcome: Progressing   Problem: Skin Integrity: Goal: Risk for impaired skin integrity will decrease 05/02/2021 1831 by Augusto Garbe, RN Outcome: Progressing 05/02/2021 1828 by Augusto Garbe, RN Outcome:  Progressing

## 2021-05-02 NOTE — Progress Notes (Signed)
Physical Therapy Treatment Patient Details Name: Thomas Duarte MRN: [id number] DOB: Mar 30, 1941 Today's Date: 05/02/2021   History of Present Illness Pt is an 80 y.o. male admitted 04/10/21 with c/o worsening BLE weakness, urinary incontinence; of note, pt recently started prostate CA treatment (completed 4 wks of radiation 2 wks prior). Workup for AKI on CKD 3. Lumbar MRI showed degenerative disease. 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    PT Comments    Pt drowsy on entry and states " I don't want to get up but I know I need to get up." When covers pulled back pt noted to have had large liquid BM of which he was unaware. NT and RN present to help with clean up. Pt with improved mobility today able to perform bed mobility with modA, transfers with min Ax2 where he is able to static stand for ~ 16min for pericare. Pt with more bowel incontinence and able to ambulate with min A from bed to [facility]. Again able to stand with min Ax2 and static stand ~3 min before walking to recliner. D/c plans remain appropriate at this time. PT will continue to follow acutely however pt hopeful for d/c over the weekend.     Recommendations for follow up therapy are one component of a multi-disciplinary discharge planning process, led by the attending physician.  Recommendations may be updated based on patient status, additional functional criteria and insurance authorization.  Follow Up Recommendations  SNF;Supervision for mobility/OOB     Equipment Recommendations  None recommended by PT       Precautions / Restrictions Precautions Precautions: Fall Precaution Comments: NG tube Restrictions Weight Bearing Restrictions: No     Mobility  Bed Mobility Overal bed mobility: Needs Assistance Bed Mobility: Supine to Sit   Sidelying to sit: Mod assist;HOB elevated        General bed mobility comments: modA for bringing trunk to upright    Transfers Overall transfer level: Needs assistance Equipment used: Rolling walker (2 wheeled) Transfers: Sit to/from [facility] Sit to Stand: +2 physical assistance;Min assist         General transfer comment: minAx2 for power up from elevated bed surface.  Ambulation/Gait Ambulation/Gait assistance: Min assist;+2 safety/equipment Gait Distance (Feet): 3 Feet (+3) Assistive device: Rolling walker (2 wheeled) Gait Pattern/deviations: Step-to pattern;Decreased step length - right;Decreased step length - left;Shuffle;Trunk flexed Gait velocity: Decreased Gait velocity interpretation: <1.31 ft/sec, indicative of household ambulator General Gait Details: minA for ambulation from bed to [facility] and BSC to recliner          Balance Overall balance assessment: Needs assistance Sitting-balance support: No upper extremity supported;Feet supported Sitting balance-Leahy Scale: Good     Standing balance support: Bilateral upper extremity supported;Single extremity supported;During functional activity Standing balance-Leahy Scale: Poor Standing balance comment: Reliant on UE support, and min A               High Level Balance Comments: able to static stand with UE support for 2 bouts of 3+ min for pericare and scaral foam pad placement            Cognition Arousal/Alertness: Awake/alert Behavior During Therapy: WFL for tasks assessed/performed Overall Cognitive Status: No family/caregiver present to determine baseline cognitive functioning Area of Impairment: Awareness;Problem solving;Safety/judgement  Following Commands: Follows one step commands consistently;Follows one step commands with increased time Safety/Judgement: Decreased awareness of safety;Decreased awareness of deficits Awareness: Emergent Problem Solving: Slow processing;Requires verbal  cues;Difficulty sequencing General Comments: unaware of incontinence of a large liquid BM         General Comments General comments (skin integrity, edema, etc.): VSS on RA      Pertinent Vitals/Pain Pain Assessment: No/denies pain Faces Pain Scale: No hurt     PT Goals (current goals can now be found in the care plan section) Acute Rehab PT Goals Patient Stated Goal: to improve strength and return to independence PT Goal Formulation: With patient Time For Goal Achievement: 04/26/21 Potential to Achieve Goals: Good    Frequency    Min 3X/week      PT Plan Current plan remains appropriate       AM-PAC PT "6 Clicks" Mobility   Outcome Measure  Help needed turning from your back to your side while in a flat bed without using bedrails?: None Help needed moving from lying on your back to sitting on the side of a flat bed without using bedrails?: A Little Help needed moving to and from a bed to a chair (including a wheelchair)?: A Little Help needed standing up from a chair using your arms (e.g., wheelchair or bedside chair)?: A Little Help needed to walk in hospital room?: A Little Help needed climbing 3-5 steps with a railing? : A Lot 6 Click Score: 18    End of Session Equipment Utilized During Treatment: Gait belt Activity Tolerance: Patient tolerated treatment well Patient left: with call bell/phone within reach;in chair;with chair alarm set Nurse Communication: Mobility status PT Visit Diagnosis: Other abnormalities of gait and mobility (R26.89);Muscle weakness (generalized) (M62.81)     Time: 1353-1430 PT Time Calculation (min) (ACUTE ONLY): 37 min  Charges:  $Therapeutic Exercise: 8-22 mins $Therapeutic Activity: 8-22 mins                     Jalexia Lalli B. Migdalia Dk PT, DPT Acute Rehabilitation Services Pager [phone] Office [phone]    [place] 05/02/2021, 2:52 PM

## 2021-05-02 NOTE — TOC Progression Note (Addendum)
Transition of Care [facility]) - Progression Note    Patient Details  Name: Thomas Duarte MRN: [id number] Date of Birth: 11-04-1940  Transition of [facility]) CM/SW Contact  Reece Agar, [place] Phone Number: 05/02/2021, 10:24 AM  Clinical Narrative:    CSW followed up with Ebony Hail with [employer] about pt auth, no answer CSW will follow up on pt DC after Allison's response.  Pt Thomas Duarte is back Ebony Hail at [facility] is aware pt will DC tomorrow. Ebony Hail can be contacted at [phone].   Expected Discharge Plan: [place] Barriers to Discharge: Continued Medical Work up  Expected Discharge Plan and Services Expected Discharge Plan: [place] In-house Referral: Clinical Social Work   Post Acute Care Choice: [place] Living arrangements for the past 2 months: Single Family Home Expected Discharge Date: 04/16/21                                     Social Determinants of Health (SDOH) Interventions    Readmission Risk Interventions No flowsheet data found.

## 2021-05-02 NOTE — Plan of Care (Signed)
  Problem: Education: Goal: Knowledge of General Education information will improve Description: Including pain rating scale, medication(s)/side effects and non-pharmacologic comfort measures Outcome: Progressing   Problem: Health Behavior/Discharge Planning: Goal: Ability to manage health-related needs will improve Outcome: Progressing   Problem: Clinical Measurements: Goal: Ability to maintain clinical measurements within normal limits will improve Outcome: Progressing Goal: Will remain free from infection Outcome: Progressing Goal: Diagnostic test results will improve Outcome: Progressing Goal: Respiratory complications will improve Outcome: Progressing Goal: Cardiovascular complication will be avoided Outcome: Progressing   Problem: Activity: Goal: Risk for activity intolerance will decrease Outcome: Progressing   Problem: Elimination: Goal: Will not experience complications related to bowel motility Outcome: Progressing   Problem: Skin Integrity: Goal: Risk for impaired skin integrity will decrease Outcome: Progressing

## 2021-05-02 NOTE — Evaluation (Signed)
Clinical/Bedside Swallow Evaluation Patient Details  Name: Thomas Duarte MRN: [id number] Date of Birth: 11/23/1940  Today's Date: 05/02/2021 Time: SLP Start Time (ACUTE ONLY): 1522 SLP Stop Time (ACUTE ONLY): 1555 SLP Time Calculation (min) (ACUTE ONLY): 33 min  Past Medical History:  Past Medical History:  Diagnosis Date   BPH (benign prostatic hyperplasia)    Diabetes mellitus without complication ([place])    Hypercholesteremia    Hypertension    Stroke ([place]) 2019   x2; numbness of right thumb and pointer and middle finger, no other deficits   Past Surgical History:  Past Surgical History:  Procedure Laterality Date   CATARACT EXTRACTION W/PHACO Left 10/21/2018   Procedure: CATARACT EXTRACTION PHACO AND INTRAOCULAR LENS PLACEMENT LEFT EYE;  Surgeon: Baruch Goldmann, MD;  Location: [facility];  Service: Ophthalmology;  Laterality: Left;  CDE: 5.18   CATARACT EXTRACTION W/PHACO Right 06/23/2019   Procedure: CATARACT EXTRACTION PHACO AND INTRAOCULAR LENS PLACEMENT (IOC);  Surgeon: Baruch Goldmann, MD;  Location: [facility];  Service: Ophthalmology;  Laterality: Right;  CDE: 15.0   ESOPHAGOGASTRODUODENOSCOPY (EGD) WITH PROPOFOL N/A 04/28/2021   Procedure: ESOPHAGOGASTRODUODENOSCOPY (EGD) WITH PROPOFOL;  Surgeon: Sharyn Creamer, MD;  Location: [facility];  Service: Gastroenterology;  Laterality: N/A;   EXPLORATORY LAPAROTOMY     LOA, 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 requests time 1:00   HERNIA REPAIR Left 1960   Inguinal   IR GENERIC HISTORICAL  04/28/2016   IR RADIOLOGIST EVAL & MGMT 04/28/2016 [facility]-INTERV RAD   SPACE OAR INSTILLATION N/A 12/16/2020   Procedure: SPACE OAR INSTILLATION;  Surgeon: Cleon Gustin, MD;  Location: [facility];  Service: Urology;  Laterality: N/A;   TONSILLECTOMY  1952    adenoidectomy   HPI:       Assessment / Plan / Recommendation  Clinical Impression  Pt stated symptoms started yesterday, coughing with po's and pills this morning. He denies difficulty swallowing or GERD prior to hospitalization. A recent EGD revealed gastric ulcers, esophagitis and NG tube trauma. Oral-motor unremarkable. He exhibits pharyngeal dysphagia that may be related to esophageal findings. Coughed once prior to po's and with thin, nectar and honey although increased frequency with  thin. Mastication unremarkable. Would benefit from [facility] however unable to obtain assessment prior to discharge tomorrow. Texture downgraded to Dys 2/nectar, f/u ST at SNF and return for outpatient MBS if cannot upgrade at bedside. SLP Visit Diagnosis: Dysphagia, unspecified (R13.10)    Aspiration Risk  Mild aspiration risk;Moderate aspiration risk    Diet Recommendation Dysphagia 2 (Fine chop);Nectar-thick liquid   Liquid Administration via: Cup Medication Administration: Crushed with puree Supervision: Patient able to self feed Compensations: Small sips/bites;Slow rate Postural Changes: Seated upright at 90 degrees    Other  Recommendations Oral Care Recommendations: Oral care BID    Recommendations for follow up therapy are one component of a multi-disciplinary discharge planning process, led by the attending physician.  Recommendations may be updated based on patient status, additional functional criteria and insurance authorization.  Follow up Recommendations Skilled Nursing facility      Frequency and Duration            Prognosis        Swallow Study   General Date of Onset: 05/02/21 Type of Study: Bedside Swallow Evaluation Previous Swallow Assessment:  (none) Diet Prior  to this Study: NPO Temperature Spikes Noted: No Respiratory Status: Room air History of Recent Intubation: No Behavior/Cognition: Alert;Cooperative;Pleasant mood;Requires cueing Oral Cavity Assessment:  Within Functional Limits Oral Care Completed by SLP: No Oral Cavity - Dentition: Dentures, top (missing some lower) Vision: Functional for self-feeding Self-Feeding Abilities: Able to feed self Patient Positioning: Upright in chair Baseline Vocal Quality: Normal Volitional Cough: Strong Volitional Swallow: Able to elicit    Oral/Motor/Sensory Function Overall Oral Motor/Sensory Function: Within functional limits   Ice Chips Ice chips: Not tested   Thin Liquid Thin Liquid: Impaired Presentation: Cup Pharyngeal  Phase Impairments: Throat Clearing - Immediate;Cough - Immediate;Throat Clearing - Delayed    Nectar Thick Nectar Thick Liquid: Impaired Pharyngeal Phase Impairments: Throat Clearing - Immediate;Cough - Delayed   Honey Thick Honey Thick Liquid: Impaired Pharyngeal Phase Impairments: Cough - Delayed   Puree Puree: Within functional limits   Solid     Solid: Within functional limits      [name] 05/02/2021,4:39 PM

## 2021-05-02 NOTE — Progress Notes (Signed)
PROGRESS NOTE  Thomas Duarte TML:[id number] DOB: 08-25-1940 DOA: 04/10/2021 PCP: Monico Blitz, MD  Late entry of the note. 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  X-ray of the abdomen showed some ileus-like features.  General surgery was consulted.  Patient underwent conservative treatment for partial small bowel obstruction.TPN was initiated.  Barium follow-through showed  contrast in the colon.   Patient was then advised full liquids today.  [place] hospitalization, patient underwent EGD due to bloody NG aspirate and was noted to have gastric ulcers, esophagitis and NG tube trauma.  Patient is currently awaiting for skilled nursing facility placement.  We will need to wean TPN and advance diet prior to discharge.  Assessment/Plan:  Principal Problem:   AKI (acute kidney injury) ([place]) Active Problems:   Prostate cancer ([place])   Hypertension   General weakness   Diabetes mellitus without complication ([facility])   Malnutrition of moderate degree   Partial small bowel obstruction likely ileus/esophagitis/nonbleeding gastric ulcers General surgery on board.  Improved with conservative treatment.  Patient initially had NG tube for gastric decompression.  Subsequently, patient started having bloody NG aspirate and GI was consulted.  EGD done showed nonbleeding gastric ulcers and esophagitis with NG tube trauma.  Patient was started on TPN due to prolonged ileus.  Now resolved.  Tolerating diet.  TPN is stopped.  Cleared by surgery to be discharged.  Patient remains on twice daily p.o. Protonix 40 mg.  Acute blood loss anemia secondary to upper  GI bleed likely from severe esophagitis and gastric ulcers with NG tube trauma.    received 3 units of packed RBCs so far.  Status post EGD 04/28/2021 with findings as mentioned above with GI recommends proton pump inhibitor twice daily for 8 weeks and sucralfate 1 g p.o. 4 times daily for 4 weeks.  Serum for H. pylori pending.  Treat for H. pylori if positive.  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  Monitor.  Generalized weakness with ambulatory dysfunction.  Physical therapy has recommended skilled nursing facility placement.  Patient will follow-up with neurosurgery as outpatient.    Urinary retention/ incontinence History of prostate cancer status post radiation.  Continue supportive care.  External urinary catheter in place.  No issues with voiding.  Elevated troponin likely secondary to  demand ischemia and acute kidney injury.  No acute issues.  Congestive heart failure:   Lasix on hold.  Patient is a still compensated.  Consider Lasix in 1 to 2 days once his p.o. intake picks up.  History of prostate cancer.  Status post radiation treatment.  Follows up with urology as outpatient.   Bradycardia.   Improved at this time.  EKG showing PVCs and bigeminy pattern.  We will continue to monitor.  Currently on Toprol-XL 25 mg daily blood pressure seems to be stable.   History of stroke.  On aspirin and Plavix at home.  Currently been advanced to full liquids today.  Aspirin and Plavix on hold due to recent bleed.  EGD completed on 04/28/2021.  We will hold antiplatelet until 05/05/2021 and likely resume slowly if hemoglobin remained stable  Moderate protein calorie malnutrition.  Dietitian on  board  Hypokalemia.  Resolved  Hypernatremia.  Resolved.  Code Status: Full  Family Communication:  None.  Patient alert and oriented   Disposition Plan:   Status is: Inpatient   Dispo: The patient is from: Home              Anticipated d/c is  to: Skilled nursing facility.              Anticipated d/c date is: Likely in 2-3 days, insurance authorization pending.              Patient currently medically stable for discharge  Consultants: General surgery GI  Procedures: NG tube placement PRBC transfusion EGD on 04/28/2021  Antimicrobials: None  DVT prophylaxis: SCD  Subjective  Patient seen and examined.  He has no complaints.  Objective: Vitals:   05/01/21 1216 05/01/21 1932 05/02/21 0028 05/02/21 0421  BP: 119/63 (!) 123/92  136/67  Pulse: 74 75  71  Resp: 20 20  18   Temp: 98 F (36.7 C) 97.9 F (36.6 C)  (!) 97.5 F (36.4 C)  TempSrc: Oral Oral  Oral  SpO2:  95%  95%  Weight:   95.8 kg   Height:        Intake/Output Summary (Last 24 hours) at 05/02/2021 1450 Last data filed at 05/02/2021 0029 Gross per 24 hour  Intake --  Output 200 ml  Net -200 ml    Filed Weights   04/30/21 0444 05/01/21 0233 05/02/21 0028  Weight: 94.3 kg 94.8 kg 95.8 kg   Body mass index is 27.86 kg/m.  Physical exam: General exam: Appears calm and comfortable  Respiratory system: Clear to auscultation. Respiratory effort normal. Cardiovascular system: S1 & S2 heard, RRR. No JVD, murmurs, rubs, gallops or clicks. No pedal edema. Gastrointestinal system: Abdomen is nondistended, soft and nontender. No organomegaly or masses felt. Normal bowel sounds heard. Central nervous system: Alert and oriented. No focal neurological deficits. Extremities: Symmetric 5 x 5 power. Skin: No rashes, lesions or ulcers.  Psychiatry: Judgement and insight appear normal. Mood & affect appropriate.     Data Reviewed:  CBC: Recent Labs  Lab 04/26/21 0335 04/27/21 0147 04/28/21 0239 04/28/21 1331 04/29/21 0345 04/30/21 0334  WBC 7.7 7.6 5.7  --  5.9 8.4  HGB 7.3* 6.3* 6.7* 9.5* 9.4* 9.1*  HCT 23.4* 20.5* 21.5* 28.9* 28.8* 27.9*  MCV 101.3* 101.5* 96.8  --  92.6 92.7  PLT 188 194 124*  --  112* 102*    Basic Metabolic  Panel: Recent Labs  Lab 04/26/21 0335 04/27/21 0147 04/28/21 0239 04/28/21 1331 04/29/21 0345 04/30/21 0334  NA 153* 154* 142 151* 146* 142  K 3.3* 3.2* 4.7 3.0* 3.2* 3.5  CL 115* 110 102 105 104 103  CO2 29 34* 36* 36* 34* 31  GLUCOSE 121* 187* 476* 124* 107* 110*  BUN 50* 52* 52* 48* 46* 42*  CREATININE 2.15* 2.38* 2.72* 2.68* 2.55* 2.35*  CALCIUM 8.9 8.6* 7.8* 7.8* 8.2* 8.2*  MG 2.5* 2.5* 2.3  --  2.4 2.2  PHOS  --   --  3.6  --  3.7  --     GFR: Estimated Creatinine Clearance: 27.9 mL/min (A) (by C-G formula based on SCr of 2.35 mg/dL (H)). Liver Function Tests: Recent Labs  Lab 04/27/21 0147 04/28/21 0239 04/29/21 0345  AST 15 13* 16  ALT 10 8 9   ALKPHOS 48 44 47  BILITOT 0.6 0.7 0.9  PROT 5.0* 4.3* 4.7*  ALBUMIN  2.2* 1.9* 2.1*    No results for input(s): LIPASE, AMYLASE in the last 168 hours. No results for input(s): AMMONIA in the last 168 hours. Coagulation Profile: No results for input(s): INR, PROTIME in the last 168 hours. Cardiac Enzymes: No results for input(s): CKTOTAL, CKMB, CKMBINDEX, TROPONINI in the last 168 hours. BNP (last 3 results) No results for input(s): PROBNP in the last 8760 hours. HbA1C: No results for input(s): HGBA1C in the last 72 hours. CBG: Recent Labs  Lab 05/02/21 0024 05/02/21 0417 05/02/21 0528 05/02/21 0738 05/02/21 1209  GLUCAP 113* 122* 118* 122* 178*    Lipid Profile: No results for input(s): CHOL, HDL, LDLCALC, TRIG, CHOLHDL, LDLDIRECT in the last 72 hours.  Thyroid Function Tests: No results for input(s): TSH, T4TOTAL, FREET4, T3FREE, THYROIDAB in the last 72 hours. Anemia Panel: No results for input(s): VITAMINB12, FOLATE, FERRITIN, TIBC, IRON, RETICCTPCT in the last 72 hours. Urine analysis:    Component Value Date/Time   COLORURINE YELLOW 04/11/2021 0814   APPEARANCEUR HAZY (A) 04/11/2021 0814   APPEARANCEUR Clear 01/01/2021 1448   LABSPEC 1.010 04/11/2021 0814   PHURINE 6.0 04/11/2021 0814    GLUCOSEU NEGATIVE 04/11/2021 0814   HGBUR NEGATIVE 04/11/2021 0814   BILIRUBINUR NEGATIVE 04/11/2021 0814   BILIRUBINUR Negative 01/01/2021 [place] 04/11/2021 0814   PROTEINUR NEGATIVE 04/11/2021 0814   NITRITE NEGATIVE 04/11/2021 0814   LEUKOCYTESUR NEGATIVE 04/11/2021 0814   Sepsis Labs: @LABRCNTIP (procalcitonin:4,lacticidven:4)  ) No results found for this or any previous visit (from the past 240 hour(s)).    Studies: No results found.  Scheduled Meds:  docusate sodium  100 mg Oral BID   feeding supplement  237 mL Oral BID BM   insulin aspart  0-15 Units Subcutaneous TID WC   metoprolol succinate  25 mg Oral Daily   multivitamin with minerals  1 tablet Oral Daily   pantoprazole  40 mg Oral BID   polyethylene glycol  17 g Oral Daily   sodium chloride flush  10-40 mL Intracatheter Q12H   sucralfate  1 g Oral TID WC & HS   tamsulosin  0.8 mg Oral QHS    Continuous Infusions:  promethazine (PHENERGAN) injection (IM or IVPB) Stopped (04/21/21 1930)     LOS: 22 days   Darliss Cheney, MD [facility] 05/02/2021, 2:50 PM

## 2021-05-02 NOTE — Progress Notes (Signed)
PROGRESS NOTE  WOODROW DRAB WNI:[id number] DOB: 1941-02-21 DOA: 04/10/2021 PCP: Monico Blitz, MD  Late entry of the note. 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  X-ray of the abdomen showed some ileus-like features.  General surgery was consulted.  Patient underwent conservative treatment for partial small bowel obstruction.TPN was initiated.  Barium follow-through showed  contrast in the colon.   Patient was then advised full liquids today.  [place] hospitalization, patient underwent EGD due to bloody NG aspirate and was noted to have gastric ulcers, esophagitis and NG tube trauma.  Patient is currently awaiting for skilled nursing facility placement.  We will need to wean TPN and advance diet prior to discharge.  Assessment/Plan:  Principal Problem:   AKI (acute kidney injury) ([place]) Active Problems:   Prostate cancer ([place])   Hypertension   General weakness   Diabetes mellitus without complication (HCC)   Malnutrition of moderate degree   Partial small bowel obstruction likely ileus/esophagitis/nonbleeding gastric ulcers General surgery on board.  Improved with conservative treatment.  Patient initially had NG tube for gastric decompression.  Subsequently, patient started having bloody NG aspirate and GI was consulted.  EGD done showed nonbleeding gastric ulcers and esophagitis with NG tube trauma.  Patient was started on TPN due to prolonged ileus.  Now resolved.  TPN is stopped.  Cleared by surgery to be discharged.  Patient remains on twice daily p.o. Protonix 40 mg.  Per primary RN, patient was noted to have some dysphagia at this  morning.  Patient was also having some difficulty coughing up as well.  He had some rhonchi and rattling on lung examination as well.  Patient was kept NPO.  SLP has been consulted.  Awaiting assessment by SLP.  Acute blood loss anemia secondary to upper GI bleed likely from severe esophagitis and gastric ulcers with NG tube trauma.    received 3 units of packed RBCs so far.  Status post EGD 04/28/2021 with findings as mentioned above with GI recommends proton pump inhibitor twice daily for 8 weeks and sucralfate 1 g p.o. 4 times daily for 4 weeks.  Serum for H. pylori pending.  Treat for H. pylori if positive.  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  Monitor.  Generalized weakness with ambulatory dysfunction.  Physical therapy has recommended skilled nursing facility placement.  Patient will follow-up with neurosurgery as outpatient.    Urinary retention/ incontinence History of prostate cancer status post radiation.  Continue supportive care.  External urinary catheter in place.  No issues with voiding.  Elevated troponin likely secondary to  demand ischemia and acute kidney injury.  No acute issues.  Congestive heart failure:   Lasix on hold.  Patient is a still compensated.  Consider Lasix in 1 to 2 days once his p.o. intake picks up.  History of prostate cancer.  Status post radiation treatment.  Follows up with urology as outpatient.   Bradycardia.   Improved at this time.  EKG showing PVCs and bigeminy pattern.  We will continue to monitor.  Currently on Toprol-XL 25 mg daily blood pressure seems to be stable.   History of stroke.  On aspirin and  Plavix at home.  Aspirin and Plavix on hold due to recent bleed.  EGD completed on 04/28/2021.  We will hold antiplatelet until 05/05/2021 and likely resume slowly if hemoglobin remained stable.   Moderate protein calorie malnutrition.  Dietitian on board  Hypokalemia.  Resolved  Hypernatremia.   Resolved.  Code Status: Full  Family Communication:  None.  Patient alert and oriented   Disposition Plan:   Status is: Inpatient   Dispo: The patient is from: Home              Anticipated d/c is to: Skilled nursing facility.              Anticipated d/c date is: Likely in 2-3 days, insurance authorization pending.              Patient currently medically stable for discharge  Consultants: General surgery GI  Procedures: NG tube placement PRBC transfusion EGD on 04/28/2021  Antimicrobials: None  DVT prophylaxis: SCD  Subjective  Patient seen and examined this morning.  While I was seeing him, he was coughing and was complaining of having difficulty to cough up stuff.  He was also complaining of some shortness of breath.  He was visibly having difficulty swallowing as well.  He looked comfortable though.  Objective: Vitals:   05/01/21 1216 05/01/21 1932 05/02/21 0028 05/02/21 0421  BP: 119/63 (!) 123/92  136/67  Pulse: 74 75  71  Resp: 20 20  18   Temp: 98 F (36.7 C) 97.9 F (36.6 C)  (!) 97.5 F (36.4 C)  TempSrc: Oral Oral  Oral  SpO2:  95%  95%  Weight:   95.8 kg   Height:        Intake/Output Summary (Last 24 hours) at 05/02/2021 1453 Last data filed at 05/02/2021 0029 Gross per 24 hour  Intake --  Output 200 ml  Net -200 ml    Filed Weights   04/30/21 0444 05/01/21 0233 05/02/21 0028  Weight: 94.3 kg 94.8 kg 95.8 kg   Body mass index is 27.86 kg/m.  Physical exam: General exam: Appears calm and comfortable  Respiratory system: Rattles bilaterally. Respiratory effort normal. Cardiovascular system: S1 & S2 heard, RRR. No JVD, murmurs, rubs, gallops or clicks. No pedal edema. Gastrointestinal system: Abdomen is nondistended, soft and nontender. No organomegaly or masses felt. Normal bowel sounds heard. Central nervous system: Alert and oriented. No focal neurological deficits. Extremities: Symmetric 5 x 5 power. Skin: No rashes, lesions or  ulcers.  Psychiatry: Judgement and insight appear normal. Mood & affect appropriate.   Data Reviewed:  CBC: Recent Labs  Lab 04/26/21 0335 04/27/21 0147 04/28/21 0239 04/28/21 1331 04/29/21 0345 04/30/21 0334  WBC 7.7 7.6 5.7  --  5.9 8.4  HGB 7.3* 6.3* 6.7* 9.5* 9.4* 9.1*  HCT 23.4* 20.5* 21.5* 28.9* 28.8* 27.9*  MCV 101.3* 101.5* 96.8  --  92.6 92.7  PLT 188 194 124*  --  112* 102*    Basic Metabolic Panel: Recent Labs  Lab 04/26/21 0335 04/27/21 0147 04/28/21 0239 04/28/21 1331 04/29/21 0345 04/30/21 0334  NA 153* 154* 142 151* 146* 142  K 3.3* 3.2* 4.7 3.0* 3.2* 3.5  CL 115* 110 102 105 104 103  CO2 29 34* 36* 36* 34* 31  GLUCOSE 121* 187* 476* 124* 107* 110*  BUN 50* 52* 52* 48* 46* 42*  CREATININE 2.15* 2.38* 2.72* 2.68* 2.55* 2.35*  CALCIUM 8.9 8.6* 7.8* 7.8* 8.2* 8.2*  MG 2.5* 2.5* 2.3  --  2.4 2.2  PHOS  --   --  3.6  --  3.7  --     GFR: Estimated Creatinine Clearance: 27.9 mL/min (A) (by C-G formula based on SCr of 2.35 mg/dL (H)). Liver Function Tests: Recent Labs  Lab 04/27/21 0147 04/28/21 0239 04/29/21 0345  AST 15 13* 16  ALT 10 8 9   ALKPHOS 48 44 47  BILITOT 0.6 0.7 0.9  PROT 5.0* 4.3* 4.7*  ALBUMIN 2.2* 1.9* 2.1*    No results for input(s): LIPASE, AMYLASE in the last 168 hours. No results for input(s): AMMONIA in the last 168 hours. Coagulation Profile: No results for input(s): INR, PROTIME in the last 168 hours. Cardiac Enzymes: No results for input(s): CKTOTAL, CKMB, CKMBINDEX, TROPONINI in the last 168 hours. BNP (last 3 results) No results for input(s): PROBNP in the last 8760 hours. HbA1C: No results for input(s): HGBA1C in the last 72 hours. CBG: Recent Labs  Lab 05/02/21 0024 05/02/21 0417 05/02/21 0528 05/02/21 0738 05/02/21 1209  GLUCAP 113* 122* 118* 122* 178*    Lipid Profile: No results for input(s): CHOL, HDL, LDLCALC, TRIG, CHOLHDL, LDLDIRECT in the last 72 hours.  Thyroid Function Tests: No results  for input(s): TSH, T4TOTAL, FREET4, T3FREE, THYROIDAB in the last 72 hours. Anemia Panel: No results for input(s): VITAMINB12, FOLATE, FERRITIN, TIBC, IRON, RETICCTPCT in the last 72 hours. Urine analysis:    Component Value Date/Time   COLORURINE YELLOW 04/11/2021 0814   APPEARANCEUR HAZY (A) 04/11/2021 0814   APPEARANCEUR Clear 01/01/2021 1448   LABSPEC 1.010 04/11/2021 0814   PHURINE 6.0 04/11/2021 0814   GLUCOSEU NEGATIVE 04/11/2021 0814   HGBUR NEGATIVE 04/11/2021 0814   BILIRUBINUR NEGATIVE 04/11/2021 0814   BILIRUBINUR Negative 01/01/2021 [place] 04/11/2021 0814   PROTEINUR NEGATIVE 04/11/2021 0814   NITRITE NEGATIVE 04/11/2021 0814   LEUKOCYTESUR NEGATIVE 04/11/2021 0814   Sepsis Labs: @LABRCNTIP (procalcitonin:4,lacticidven:4)  ) No results found for this or any previous visit (from the past 240 hour(s)).    Studies: No results found.  Scheduled Meds:  docusate sodium  100 mg Oral BID   feeding supplement  237 mL Oral BID BM   insulin aspart  0-15 Units Subcutaneous TID WC   metoprolol succinate  25 mg Oral Daily   multivitamin with minerals  1 tablet Oral Daily   pantoprazole  40 mg Oral BID   polyethylene glycol  17 g Oral Daily   sodium chloride flush  10-40 mL Intracatheter Q12H   sucralfate  1 g Oral TID WC & HS   tamsulosin  0.8 mg Oral QHS    Continuous Infusions:  promethazine (PHENERGAN) injection (IM or IVPB) Stopped (04/21/21 1930)     LOS: 22 days   Darliss Cheney, MD [facility] 05/02/2021, 2:53 PM

## 2021-05-03 DIAGNOSIS — K259 Gastric ulcer, unspecified as acute or chronic, without hemorrhage or perforation: Secondary | ICD-10-CM

## 2021-05-03 DIAGNOSIS — K567 Ileus, unspecified: Secondary | ICD-10-CM

## 2021-05-03 DIAGNOSIS — R338 Other retention of urine: Secondary | ICD-10-CM

## 2021-05-03 DIAGNOSIS — R1310 Dysphagia, unspecified: Secondary | ICD-10-CM

## 2021-05-03 DIAGNOSIS — K209 Esophagitis, unspecified without bleeding: Secondary | ICD-10-CM

## 2021-05-03 DIAGNOSIS — J690 Pneumonitis due to inhalation of food and vomit: Secondary | ICD-10-CM

## 2021-05-03 DIAGNOSIS — N184 Chronic kidney disease, stage 4 (severe): Secondary | ICD-10-CM

## 2021-05-03 DIAGNOSIS — K56609 Unspecified intestinal obstruction, unspecified as to partial versus complete obstruction: Secondary | ICD-10-CM

## 2021-05-03 LAB — CBC WITH DIFFERENTIAL/PLATELET
Abs Immature Granulocytes: 0.13 10*3/uL — ABNORMAL HIGH (ref 0.00–0.07)
Basophils Absolute: 0 10*3/uL (ref 0.0–0.1)
Basophils Relative: 0 %
Eosinophils Absolute: 0.2 10*3/uL (ref 0.0–0.5)
Eosinophils Relative: 2 %
HCT: 26.3 % — ABNORMAL LOW (ref 39.0–52.0)
Hemoglobin: 8.7 g/dL — ABNORMAL LOW (ref 13.0–17.0)
Immature Granulocytes: 1 %
Lymphocytes Relative: 5 %
Lymphs Abs: 0.5 10*3/uL — ABNORMAL LOW (ref 0.7–4.0)
MCH: 30.4 pg (ref 26.0–34.0)
MCHC: 33.1 g/dL (ref 30.0–36.0)
MCV: 92 fL (ref 80.0–100.0)
Monocytes Absolute: 1 10*3/uL (ref 0.1–1.0)
Monocytes Relative: 10 %
Neutro Abs: 8.8 10*3/uL — ABNORMAL HIGH (ref 1.7–7.7)
Neutrophils Relative %: 82 %
Platelets: 120 10*3/uL — ABNORMAL LOW (ref 150–400)
RBC: 2.86 MIL/uL — ABNORMAL LOW (ref 4.22–5.81)
RDW: 14.9 % (ref 11.5–15.5)
WBC: 10.6 10*3/uL — ABNORMAL HIGH (ref 4.0–10.5)
nRBC: 0 % (ref 0.0–0.2)

## 2021-05-03 LAB — GLUCOSE, CAPILLARY
Glucose-Capillary: 119 mg/dL — ABNORMAL HIGH (ref 70–99)
Glucose-Capillary: 132 mg/dL — ABNORMAL HIGH (ref 70–99)
Glucose-Capillary: 141 mg/dL — ABNORMAL HIGH (ref 70–99)
Glucose-Capillary: 152 mg/dL — ABNORMAL HIGH (ref 70–99)
Glucose-Capillary: 158 mg/dL — ABNORMAL HIGH (ref 70–99)
Glucose-Capillary: 252 mg/dL — ABNORMAL HIGH (ref 70–99)
Glucose-Capillary: 85 mg/dL (ref 70–99)

## 2021-05-03 LAB — BASIC METABOLIC PANEL
Anion gap: 9 (ref 5–15)
BUN: 45 mg/dL — ABNORMAL HIGH (ref 8–23)
CO2: 27 mmol/L (ref 22–32)
Calcium: 8.2 mg/dL — ABNORMAL LOW (ref 8.9–10.3)
Chloride: 100 mmol/L (ref 98–111)
Creatinine, Ser: 2.27 mg/dL — ABNORMAL HIGH (ref 0.61–1.24)
GFR, Estimated: 28 mL/min — ABNORMAL LOW (ref >60)
Glucose, Bld: 148 mg/dL — ABNORMAL HIGH (ref 70–99)
Potassium: 3.6 mmol/L (ref 3.5–5.1)
Sodium: 136 mmol/L (ref 135–145)

## 2021-05-03 LAB — MAGNESIUM: Magnesium: 2.1 mg/dL (ref 1.7–2.4)

## 2021-05-03 MED ORDER — PANTOPRAZOLE SODIUM 40 MG PO TBEC: 40.0000 mg | DELAYED_RELEASE_TABLET | Freq: Two times a day (BID) | ORAL | 0 refills | Status: AC

## 2021-05-03 MED ORDER — AMOXICILLIN-POT CLAVULANATE 875-125 MG PO TABS: 1.0000 | ORAL_TABLET | Freq: Two times a day (BID) | ORAL | 0 refills | Status: DC

## 2021-05-03 MED ORDER — TETRACYCLINE HCL 500 MG PO CAPS: 500.0000 mg | ORAL_CAPSULE | Freq: Four times a day (QID) | ORAL | 0 refills | Status: AC

## 2021-05-03 MED ORDER — SUCRALFATE 1 G PO TABS: 1.0000 g | ORAL_TABLET | Freq: Three times a day (TID) | ORAL | 0 refills | Status: AC

## 2021-05-03 MED ORDER — METRONIDAZOLE 500 MG PO TABS: 500.0000 mg | ORAL_TABLET | Freq: Four times a day (QID) | ORAL | 0 refills | Status: AC

## 2021-05-03 MED ORDER — PEPTO-BISMOL 524 MG/30ML PO SUSP: 30.0000 mL | Freq: Four times a day (QID) | ORAL | 0 refills | Status: AC | PRN

## 2021-05-03 MED ORDER — ASPIRIN EC 81 MG PO TBEC: 81.0000 mg | DELAYED_RELEASE_TABLET | Freq: Every day | ORAL | 0 refills | Status: DC

## 2021-05-03 NOTE — Progress Notes (Signed)
Speech Language Pathology Treatment: Dysphagia  Thomas Duarte Details Name: Thomas Duarte MRN: [id number] DOB: 1941-03-09 Today's Date: 05/03/2021 Time: 0930-0950 SLP Time Calculation (min) (ACUTE ONLY): 20 min  Assessment / Plan / Recommendation Clinical Impression  Thomas Duarte was seen upright in bed with his breakfast meal of eggs, ground sausage, ensure, and both nectar and thin liquids. He continues with intermittent coughing and congested breathing. Coughing does not appear to increase with PO intake. CXR remains WNL and respiratory status is stable. Suspect cough is related to significant CHF as well as esophageal deficits (see EGD report). Pt had no change in status (coughing, breathing, or otherwise) with meal and thin liquid water. He also trialed graham cracker, which had mildly slowed mastication; otherwise normal. Completion of MBSS would be ideal; however, pt is to discharge shortly to SNF with transport on the way. Should concerns arise for aspiration, please send pt for outpatient swallow study (MBSS or FEES).  Upgrade pt back to thin liquids and dys 3 solids; meds whole or crushed in puree. Oral care TID.    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 Recent EGD revealed gastric ulcers, esophagitis and NG tube trauma  X-ray of the abdomen showed some ileus-like features; general surgery consulted and pt treated. Now on oral diet, TPN weaned.      SLP Plan  Continue with current plan of care      Recommendations for follow up therapy are one component of a multi-disciplinary discharge planning process, led by the attending physician.   Recommendations may be updated based on Thomas Duarte status, additional functional criteria and insurance authorization.    Recommendations  Diet recommendations: Dysphagia 3 (mechanical soft) Liquids provided via: Straw;Cup Medication Administration: Crushed with puree Supervision: Thomas Duarte able to self feed Compensations: Small sips/bites;Slow rate Postural Changes and/or Swallow Maneuvers: Seated upright 90 degrees                Oral Care Recommendations: Oral care BID Follow up Recommendations: Skilled Nursing facility SLP Visit Diagnosis: Dysphagia, unspecified (R13.10) Plan: Continue with current plan of care                   Satsuki Zillmer P. Sarabelle Genson, M.S., [place] Pathologist Acute Rehabilitation Services Pager: [place]  05/03/2021, 10:26 AM

## 2021-05-03 NOTE — TOC Transition Note (Addendum)
Transition of Care [facility]) - CM/SW Discharge Note   Patient Details  Name: ADYAN PALAU MRN: [id number] Date of Birth: [birth year]  [facility] CM/SW Contact:  Bary Castilla, LCSW Phone Number: [phone] 05/03/2021, 1:18 PM   Clinical Narrative:     Patient will DC to:?[facility] Anticipated DC date:?05/03/2021 discharged 05/04/2021 Family notified:?Linda\ Transport by: [facility]   Pt discharged was delayed due to [facility] running behind yesterday. Facility requested updated DC date. MD notified.Per MD patient ready for DC to [facility]. RN, patient, patient's family, and facility notified of DC. Discharge Summary sent to facility. RN given number for report  [phone] room 108 DC packet on chart. Ambulance transport requested for patient.   CSW signing off.   [place],  [phone]   Final next level of care: Skilled Nursing Facility Barriers to Discharge: Barriers Resolved   Patient Goals and CMS Choice Patient states their goals for this hospitalization and ongoing recovery are:: Rehab CMS Medicare.gov Compare Post Acute Care list provided to:: Patient Choice offered to / list presented to : Patient  Discharge Placement              Patient [facility] bed at: [facility] Patient to be transferred to facility by: [place] Name of family member notified: Vaughan Basta Patient and family notified of of transfer: 05/03/21  Discharge Plan and Services In-house Referral: Clinical Social Work   Post Acute Care Choice: [place]                               Social Determinants of Health (SDOH) Interventions     Readmission Risk Interventions No flowsheet data found.

## 2021-05-03 NOTE — Plan of Care (Signed)
  Problem: Clinical Measurements: Goal: Ability to maintain clinical measurements within normal limits will improve Outcome: Progressing Goal: Will remain free from infection Outcome: Progressing Goal: Diagnostic test results will improve Outcome: Progressing Goal: Respiratory complications will improve Outcome: Progressing Goal: Cardiovascular complication will be avoided Outcome: Progressing   Problem: Activity: Goal: Risk for activity intolerance will decrease Outcome: Progressing   Problem: Elimination: Goal: Will not experience complications related to bowel motility Outcome: Progressing   Problem: Skin Integrity: Goal: Risk for impaired skin integrity will decrease Outcome: Progressing

## 2021-05-03 NOTE — Discharge Summary (Addendum)
Physician Discharge Summary  Thomas Duarte QIO:[id number] DOB: [birth year] DOA: 04/10/2021  PCP: Monico Blitz, MD  Admit date: 04/10/2021 Discharge date: 05/04/2021 30 Day Unplanned Readmission Risk Score    Flowsheet Row ED to Hosp-Admission (Current) from 04/10/2021 in [facility] [place] [facility]  30 Day Unplanned Readmission Risk Score (%) 19.35 Filed at 05/03/2021 0800       This score is the patient's risk of an unplanned readmission within 30 days of being discharged (0 -100%). The score is based on dignosis, age, lab data, medications, orders, and past utilization.   Low:  0-14.9   Medium: 15-21.9   High: 22-29.9   Extreme: 30 and above          Admitted From: Home Disposition: SNF  Recommendations for Outpatient Follow-up:  Follow up with PCP in 1-2 weeks Please obtain BMP/CBC in one week Follow-up with cardiology in 2 to 4 weeks Follow-up with GI in 4 to 8 weeks Please follow up with your PCP on the following pending results: Unresulted Labs (From admission, onward)     Start     Ordered   Unscheduled  Occult blood card to lab, stool  As needed,   R      04/27/21 0307              Home Health: None Equipment/Devices: None  Discharge Condition: Stable CODE STATUS: Full code Diet recommendation: Dysphagia 2 diet  Subjective: Seen and examined.  Slightly sleepy.  Otherwise looks stable.  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  X-ray of the abdomen showed some ileus-like features.  General surgery was consulted.  Further hospitalization course as below.   Partial small bowel obstruction  likely ileus/esophagitis/nonbleeding gastric ulcers General surgery was on board.  Patient improved with conservative treatment.  Patient initially had NG tube for gastric decompression.  Subsequently, patient started having bloody NG aspirate and GI was consulted.  EGD done showed nonbleeding gastric ulcers and esophagitis with NG tube trauma.  Patient was started on TPN due to prolonged ileus.  Now resolved.  TPN is stopped.  Cleared by surgery to be discharged.  Patient remains on twice daily p.o. Protonix 40 mg.  patient was noted to have some dysphagia yesterday morning.  Patient was also having some difficulty coughing up as well.  He had some rhonchi and rattling on lung examination as well.  Patient was kept NPO.  SLP was reconsulted.  They recommended modified barium swallow however unfortunately, this could not be done due to weekend/low staff.  However, SLP did not recommend holding his discharge.  They recommended that SNF staff/SLP should arrange outpatient MBS for him and in the meantime they have recommended dysphagia 2 diet for him.  Due to suspicion of aspiration pneumonia, chest x-ray was done which did not show any infiltrates.  However it showed questionable small amount of free air under the right hemidiaphragm.  To confirm/rule out bowel obstruction/perforation, abdominal x-ray was done and it ruled out any perforation.    Possible aspiration pneumonia: Although patient's x-ray did not show any infiltrates however it would be too early for chest x-ray to show any findings.  His white cells are slightly  up today.  No fever.  He is not hypoxic.  His lung exam is much improved with very faint rhonchi at the bases bilaterally.  I am concerned that he may be developing early aspiration pneumonia.  I recommend close monitoring of that.  Perhaps repeating chest x-ray in few days and monitoring for any fever and checking CBC in few days at [facility] as well.  He is going to be discharged on tetracycline  and Flagyl which should cover some of the aspiration pneumonia.   Acute blood loss anemia secondary to upper GI bleed likely from severe esophagitis and gastric ulcers with NG tube trauma.    received 3 units of packed RBCs so far.  Status post EGD 04/28/2021 with findings as mentioned above with GI recommends proton pump inhibitor twice daily for 8 weeks and sucralfate 1 g p.o. 4 times daily for 4 weeks.   Gastric biopsy was not obtained due to bleeding concerns.  Per GI recommendations, serum antibody test was sent to check H. pylori and that came back positive.  I personally discussed with Dr. Lorenso Courier of GI today who recommended bismuth quadruple treatment for the patient and follow-up with her in 2 months.   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  Monitor.  Generalized weakness with ambulatory dysfunction.  Physical therapy has recommended skilled nursing facility placement.  Patient will follow-up with neurosurgery as outpatient.    Urinary retention/ incontinence History of prostate cancer status post radiation.  Continue supportive care.  External urinary catheter in place.  No issues with voiding.  Elevated troponin likely secondary to  demand ischemia and acute kidney injury.  No acute issues.  Congestive heart failure:   Lasix on hold.  Patient is a still compensated.  No indication for Lasix at this point in time.  Monitor closely as outpatient.  History of prostate cancer.  Status post radiation treatment.  Follows up with urology as outpatient.   Bradycardia.   Improved at this time.  EKG showing PVCs and bigeminy pattern.  We will continue to monitor.  Currently on Toprol-XL 25 mg daily blood pressure seems to be stable.    History of stroke.  On aspirin and Plavix at home.  Aspirin and Plavix was on hold due to recent bleed.  EGD completed on 04/28/2021.  We will resume his aspirin and Plavix starting tomorrow however he was on aspirin 325 mg before and I have reduced it to 81 mg.   Moderate  protein calorie malnutrition.  Dietitian on board while in the hospital.   Hypokalemia.  Resolved   Hypernatremia.  Resolved.   PS: Patient was discharged on 05/03/2021 however [facility] could not arrive to pick him up until 11 PM.  SNF declined excepting him that lately since they do not accept new admissions after 9 PM.  Patient ended up staying in the hospital overnight.  Patient seen and examined this morning.  He is alert and oriented and has no complaints.  There is no change in his medical status and he remains a stable as he was yesterday.  Plan is to discharge him to SNF today.  Discharge Diagnoses:  Principal Problem:   AKI (acute kidney injury) ([place]) Active Problems:   Prostate cancer ([place])   Hypertension   General weakness   Diabetes mellitus without complication (HCC)   Malnutrition of moderate degree   Gastric ulcer   Esophagitis   Acute urinary retention   Dysphagia   CKD (chronic kidney disease), stage IV (HCC)  SBO (small bowel obstruction) (HCC)   Ileus ([facility])   Aspiration pneumonia [facility])    Discharge Instructions  Discharge Instructions     Ambulatory referral to Nephrology   Complete by: As directed    Diet - low sodium heart healthy   Complete by: As directed    Discharge instructions   Complete by: As directed    1)Please take prescribed medications as instructed 2)Do a BMP test in a week   Increase activity slowly   Complete by: As directed       Allergies as of 05/04/2021       Reactions   Cucumber Extract         Medication List     STOP taking these medications    aspirin 325 MG tablet Replaced by: aspirin EC 81 MG tablet   ibuprofen 200 MG tablet Commonly known as: ADVIL   lisinopril 40 MG tablet Commonly known as: ZESTRIL       TAKE these medications    amLODipine 10 MG tablet Commonly known as: NORVASC Take 1 tablet (10 mg total) by mouth daily.   aspirin EC 81 MG tablet Take 1 tablet (81 mg total) by mouth daily.  Swallow whole. Replaces: aspirin 325 MG tablet   clopidogrel 75 MG tablet Commonly known as: PLAVIX Take 75 mg by mouth at bedtime.   furosemide 40 MG tablet Commonly known as: LASIX Take 1 tablet (40 mg total) by mouth daily. What changed: when to take this   Glycerin-Hypromellose-PEG 400 0.2-0.2-1 % Soln Place 1 drop into both eyes 3 (three) times daily as needed (dry eyes).   Klor-Con M10 10 MEQ tablet Generic drug: potassium chloride Take 10 mEq by mouth 2 (two) times daily.   metFORMIN 500 MG tablet Commonly known as: GLUCOPHAGE Take 500 mg by mouth daily with breakfast.   metoprolol succinate 25 MG 24 hr tablet Commonly known as: TOPROL-XL Take 25 mg by mouth daily.   metroNIDAZOLE 500 MG tablet Commonly known as: Flagyl Take 1 tablet (500 mg total) by mouth 4 (four) times daily for 14 days.   OneTouch Delica Plus [id number] Misc SMARTSIG:1 Strip(s) Topical Daily   OneTouch Verio test strip Generic drug: glucose blood 1 each daily.   OVER THE COUNTER MEDICATION Apply 1 application topically 2 (two) times daily as needed (pain). Real Time Pain Relief   pantoprazole 40 MG tablet Commonly known as: PROTONIX Take 1 tablet (40 mg total) by mouth 2 (two) times daily.   Pepto-Bismol 262 MG/15ML suspension Generic drug: bismuth subsalicylate Take 30 mLs by mouth every 6 (six) hours as needed for up to 14 days.   sodium bicarbonate 650 MG tablet Take 1 tablet (650 mg total) by mouth 3 (three) times daily.   sucralfate 1 g tablet Commonly known as: CARAFATE Take 1 tablet (1 g total) by mouth 4 (four) times daily -  with meals and at bedtime for 25 days.   tamsulosin 0.4 MG Caps capsule Commonly known as: FLOMAX Take 0.8 mg by mouth at bedtime.   tetracycline 500 MG capsule Commonly known as: SUMYCIN Take 1 capsule (500 mg total) by mouth 4 (four) times daily for 14 days.   tobramycin 0.3 % ophthalmic solution Commonly known as: TOBREX Place 1 drop into  both eyes every 4 (four) hours as needed (after eye injections until healed).        Follow-up Information     Monico Blitz, MD. Schedule an appointment as soon as possible for  a visit in 2 week(s).   Specialty: Internal Medicine Contact information: [place] [address] [phone]         Sharyn Creamer, MD Follow up in 2 month(s).   Specialty: Internal Medicine Contact information: [address] [place] [address] [phone]                Allergies  Allergen Reactions   Cucumber Extract     Consultations: GI, general surgery   Procedures/Studies: CT ABDOMEN PELVIS WO CONTRAST  Result Date: 04/22/2021 CLINICAL DATA:  Nausea and vomiting.  History of prostate cancer. EXAM: CT ABDOMEN AND PELVIS WITHOUT CONTRAST TECHNIQUE: Multidetector CT imaging of the abdomen and pelvis was performed following the standard protocol without IV contrast. COMPARISON:  CT abdomen and pelvis 10/02/2020. FINDINGS: Lower chest: There are small bilateral pleural effusions. Hepatobiliary: No focal liver abnormality is seen. No gallstones, gallbladder wall thickening, or biliary dilatation. Pancreas: Unremarkable. No pancreatic ductal dilatation or surrounding inflammatory changes. Spleen: Normal in size without focal abnormality. Adrenals/Urinary Tract: Adrenal glands are unremarkable. There is no hydronephrosis or renal calculus. There is a stable rounded hypodensity in the medial left kidney measuring 14 mm, likely a cyst. Bladder is unremarkable. Stomach/Bowel: There are dilated small bowel loops in the mid abdomen with air-fluid levels measuring up to 4.7 cm. Oral contrast is seen proximal to this level. A transition point is likely present seen on coronal image 6/22 in the mid small bowel. Distal small bowel is decompressed. The colon and stomach are not dilated. The appendix is not visualized. No focal inflammatory changes are seen. Nasogastric tube tip is in the  mid stomach. There is no free intraperitoneal air or pneumatosis identified. Vascular/Lymphatic: Aortic atherosclerosis. No enlarged abdominal or pelvic lymph nodes. Reproductive: Prostate radiotherapy seeds are present, new from the prior examination. Other: No abdominal wall hernia or abnormality. No abdominopelvic ascites. There is some scarring in the anterior abdominal wall. Musculoskeletal: Degenerative changes affect the spine. No suspicious osseous lesions are identified. IMPRESSION: 1. Findings compatible with mid small-bowel obstruction. Transition point is seen in the mid abdomen. No free air or pneumatosis. 2. Nasogastric tube tip in the mid stomach. 3. Prostate radiotherapy seeds. 4. Small bilateral pleural effusions. Electronically Signed   By: Ronney Asters M.D.   On: 04/22/2021 20:08   DG Chest 2 View  Result Date: 04/10/2021 CLINICAL DATA:  Shortness of breath and weakness over the last week. EXAM: CHEST - 2 VIEW COMPARISON:  None. FINDINGS: Heart size upper limits of normal. Aortic atherosclerosis and tortuosity. The lungs are clear. The vascularity is normal. No evidence of heart failure. No effusion. No significant bone finding. IMPRESSION: No active disease. Heart size upper limits of normal. Aortic atherosclerosis. Electronically Signed   By: Nelson Chimes M.D.   On: 04/10/2021 15:12   DG Abd 1 View  Result Date: 05/02/2021 CLINICAL DATA:  Weakness and fatigue EXAM: ABDOMEN - 1 VIEW COMPARISON:  04/27/2021 FINDINGS: Gaseous distention of the stomach. Scattered gas in the colon. There is some upper normal caliber loops of small bowel in the right abdomen but I do not see overtly dilated small bowel. Atherosclerosis noted especially of the splenic artery. Lumbar spondylosis. No compelling evidence of free intraperitoneal gas. IMPRESSION: 1. Gaseous prominence of the stomach with scattered gas in the large bowel. There is some upper normal sized loops of small bowel in the right abdomen  which are nonspecific and not overtly abnormal in caliber. 2.  Atherosclerosis. Electronically Signed   By: Van Clines M.D.   On: 05/02/2021 18:38   DG Abd 1 View  Result Date: 04/22/2021 CLINICAL DATA:  NG tube placement in a 80 year old male with suspected ileus or partial small bowel obstruction. EXAM: ABDOMEN - 1 VIEW COMPARISON:  April 22, 2021. FINDINGS: Nasogastric tube in the distal stomach, tip in the antro pyloric region. Side port below the GE junction. Persistent small bowel distension. Scattered colonic loops with gas. Pelvic bowel loops not imaged. IMPRESSION: Nasogastric tube in the distal stomach, tip in the antro pyloric region decreased gastric distension since previous imaging. Persistent small bowel distension. Ileus versus partial small bowel obstruction, could consider CT for further evaluation as warranted. Electronically Signed   By: Zetta Bills M.D.   On: 04/22/2021 09:57   DG Abd 1 View  Result Date: 04/22/2021 CLINICAL DATA:  Ileus. EXAM: ABDOMEN - 1 VIEW COMPARISON:  April 21, 2021. FINDINGS: Stable small bowel dilatation is noted most consistent with postoperative ileus. No colonic dilatation is noted. Distal tip of nasogastric tube is seen in expected position of distal stomach. IMPRESSION: Stable small bowel dilatation is noted most consistent postoperative ileus. Electronically Signed   By: Marijo Conception M.D.   On: 04/22/2021 08:21   DG Abd 1 View  Result Date: 04/21/2021 CLINICAL DATA:  Encounter for NG tube placement EXAM: ABDOMEN - 1 VIEW COMPARISON:  One view abdomen 04/21/2021 at 1:48 p.m. FINDINGS: NG tube was advanced and the side port is now in the stomach. Gaseous distension of small bowel noted. No free air. Heart size is normal. Lung bases are clear. IMPRESSION: 1. NG tube advanced and the side port is now in the stomach. 2. Gaseous distension of small bowel. Electronically Signed   By: San Morelle M.D.   On: 04/21/2021 14:52   MR  LUMBAR SPINE WO CONTRAST  Result Date: 04/11/2021 CLINICAL DATA:  Lumbar radiculopathy, no red flags. Bilateral lower extremity weakness. Recent radiation therapy for prostate cancer. Urinary incontinence. EXAM: MRI LUMBAR SPINE WITHOUT CONTRAST TECHNIQUE: Multiplanar, multisequence MR imaging of the lumbar spine was performed. No intravenous contrast was administered. COMPARISON:  CT abdomen and pelvis 10/02/2020 FINDINGS: Segmentation:  Standard. Alignment: Unchanged facet mediated anterolisthesis of L4 on L5 measuring 5 mm. Vertebrae: No fracture or suspicious marrow lesion. Mild bilateral facet edema at L4-5. Conus medullaris and cauda equina: Conus extends to the L1 level. Conus and cauda equina appear normal. Paraspinal and other soft tissues: Partial imaging of small T2 hyperintense lesions in both kidneys measuring up to 1.4 cm, likely cysts. Disc levels: Disc desiccation throughout the lumbar spine. Preserved disc space heights. L1-2: Negative. L2-3: Disc bulging and mild facet and ligamentum flavum hypertrophy result in mild right greater than left lateral recess stenosis without significant spinal or neural foraminal stenosis. L3-4: Disc bulging and mild facet and ligamentum flavum hypertrophy result in mild-to-moderate bilateral lateral recess stenosis and mild right neural foraminal stenosis without significant spinal stenosis. 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 Potential left L4 nerve root impingement in the neural foramen. L5-S1: Mild disc bulging, severe facet hypertrophy, and a 4 mm synovial cyst projecting anterior from the right facet joint into the superior aspect of the right neural foramen result in mild  bilateral neural foraminal stenosis without spinal stenosis. IMPRESSION: 1. 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 2. Mild-to-moderate lateral recess stenosis and mild right neural foraminal stenosis at L3-4. Electronically Signed   By: Logan Bores M.D.   On: 04/11/2021 17:18   US RENAL  Result Date: 04/10/2021 CLINICAL DATA:  Acute renal insufficiency EXAM: RENAL / URINARY TRACT ULTRASOUND COMPLETE COMPARISON:  None. FINDINGS: Right Kidney: Renal measurements: 10.9 x 5.3 x 4.5 cm = volume: 135.1 mL. Echogenicity within normal limits. No mass or hydronephrosis visualized. Left Kidney: Renal measurements: 10.8 x 5.9 x 4.1 cm = volume: 137.9 mL. Echogenicity within normal limits. No mass or hydronephrosis visualized. Bladder: Appears normal for degree of bladder distention. Other: None. IMPRESSION: 1. Unremarkable renal ultrasound. Electronically Signed   By: Randa Ngo M.D.   On: 04/10/2021 18:18   DG CHEST PORT 1 VIEW  Result Date: 05/02/2021 CLINICAL DATA:  Weakness and fatigue. EXAM: PORTABLE CHEST 1 VIEW COMPARISON:  Chest x-ray 04/10/2021. FINDINGS: There the aorta is tortuous with atherosclerotic calcifications. Heart is mildly enlarged, unchanged. There is minimal atelectatic change in the left lung base. There is no lung consolidation, pleural effusion or pneumothorax. There is a questionable small amount of free air under the right hemidiaphragm. No acute fractures are seen. IMPRESSION: 1. Questionable small amount of free air under the right hemidiaphragm. Correlate clinically. 2. No acute cardiopulmonary process. 3. Stable cardiomegaly. These results were called by telephone at the time of interpretation on 05/02/2021 at 5:10 pm to provider [facility] , who verbally acknowledged these results.  Electronically Signed   By: Ronney Asters M.D.   On: 05/02/2021 17:10   DG Abd Portable 1V  Result Date: 04/27/2021 CLINICAL DATA:  Enteric catheter placement EXAM: PORTABLE ABDOMEN - 1 VIEW COMPARISON:  04/27/2021 FINDINGS: Frontal view of the lower chest and upper abdomen demonstrates slight advancement of the enteric catheter, tip projecting over the gastric fundus and side port projecting at the gastroesophageal junction. Continued nonspecific gaseous distention of the bowel. Lung bases are clear. IMPRESSION: 1. Enteric catheter as above, side port projecting at the gastroesophageal junction. Electronically Signed   By: Randa Ngo M.D.   On: 04/27/2021 22:28   DG Abd Portable 1V  Result Date: 04/27/2021 CLINICAL DATA:  NG tube placement. EXAM: PORTABLE ABDOMEN - 1 VIEW COMPARISON:  April 26, 2021 FINDINGS: Limited view of the upper abdomen demonstrates enteric catheter with tip in the expected location of the gastric cardia. The side hole is proximal to the expected location of the GE junction. Nonspecific bowel gas pattern. IMPRESSION: 1. Enteric catheter with tip in the expected location of the gastric cardia, with side hole proximal to the GE junction. Consider advancement. Electronically Signed   By: Fidela Salisbury M.D.   On: 04/27/2021 18:11   DG Abd Portable 1V  Result Date: 04/26/2021 CLINICAL DATA:  Weakness and fatigue. Evaluate for small bowel obstruction. EXAM: PORTABLE ABDOMEN - 1 VIEW COMPARISON:  Yesterday FINDINGS: Nasogastric tube terminates in the stomach with side port just above the gastroesophageal junction, similar. On both supine views, no gross free intraperitoneal air is seen. Contrast within normal caliber colon. Gas-filled small bowel loops are upper normal caliber, without focal transition identified. IMPRESSION: Upper normal gas-filled small bowel loops, favoring adynamic ileus. Further passage of contrast into the distal colon.  Electronically Signed   By:  Abigail Miyamoto M.D.   On: 04/26/2021 10:50   DG Abd Portable 1V-Small Bowel Obstruction Protocol-initial, 8 hr delay  Result Date: 04/25/2021 CLINICAL DATA:  Small bowel obstruction.  8 hour delay. EXAM: PORTABLE ABDOMEN - 1 VIEW COMPARISON:  Radiograph yesterday at 3 p.m. CT 04/22/2021 FINDINGS: Again seen enteric contrast in the ascending, descending, and sigmoid colon, this is likely from prior CT. Enteric tube remains in place with tip below the diaphragm, side-port in the region of the distal esophagus. Increased air throughout small bowel throughout the abdomen which is prominent but not abnormally dilated. IMPRESSION: 1. Enteric contrast throughout the colon is likely from prior CT. No new or additional contrast is seen. 2. Increased air throughout small bowel loops in the central abdomen, prominent but not abnormally distended. Overall findings suggest ileus or small-bowel obstruction that is incomplete. Electronically Signed   By: Keith Rake M.D.   On: 04/25/2021 01:52   DG Abd Portable 1V  Result Date: 04/24/2021 CLINICAL DATA:  Nasogastric tube placement. EXAM: PORTABLE ABDOMEN - 1 VIEW COMPARISON:  None. FINDINGS: Distal tip of nasogastric tube is seen in proximal stomach. Residual contrast is noted in the colon. The bowel gas pattern is normal. No radio-opaque calculi or other significant radiographic abnormality are seen. IMPRESSION: Distal tip of nasogastric tube is seen in proximal stomach. No abnormal bowel dilatation is noted. Electronically Signed   By: Marijo Conception M.D.   On: 04/24/2021 15:29   DG Abd Portable 1V  Result Date: 04/23/2021 CLINICAL DATA:  NG tube placement. EXAM: PORTABLE ABDOMEN - 1 VIEW COMPARISON:  Radiograph and CT yesterday. FINDINGS: Tip of the enteric tube below the diaphragm in the stomach. The side-port is in the region of the gastroesophageal junction. There is enteric contrast within the transverse colon from yesterday's CT. IMPRESSION: Tip of the enteric  tube below the diaphragm in the stomach, side-port in the region of the gastroesophageal junction. Recommend advancement of 3 5 cm for optimal placement. Electronically Signed   By: Keith Rake M.D.   On: 04/23/2021 18:30   DG Abd Portable 1V  Result Date: 04/21/2021 CLINICAL DATA:  Nasogastric tube placement, vomiting EXAM: PORTABLE ABDOMEN - 1 VIEW COMPARISON:  Portable exam 1348 hours compared to 0723 hours FINDINGS: Tip of nasogastric tube projects over distal esophagus; recommend advancing tube 12 cm to place proximal side-port within the stomach. Dilated bowel loops in upper abdomen. Gaseous distention of stomach. Atherosclerotic calcification aorta. IMPRESSION: Recommend advancing nasogastric tube 12 cm to place proximal side-port within stomach. Aortic Atherosclerosis (ICD10-I70.0). Electronically Signed   By: Lavonia Dana M.D.   On: 04/21/2021 14:08   DG Abd Portable 1V  Result Date: 04/21/2021 CLINICAL DATA:  Vomiting, abdominal pressure, inpatient EXAM: PORTABLE ABDOMEN - 1 VIEW COMPARISON:  None. FINDINGS: Moderately dilated small bowel loops throughout the central abdomen up to the 5.0 cm diameter. Mild colonic stool and gas. No evidence of pneumatosis or pneumoperitoneum. Mild gastric distension. Clear lung bases. No radiopaque nephrolithiasis. Moderate lumbar spondylosis. IMPRESSION: Moderately dilated small bowel loops throughout the central abdomen and mild gastric distension, suspicious for distal small bowel obstruction. No evidence of pneumatosis or pneumoperitoneum. CT abdomen/pelvis with oral and IV contrast may be obtained for further evaluation as clinically warranted. Electronically Signed   By: Ilona Sorrel M.D.   On: 04/21/2021 07:36   [place] EKG SITE RITE  Result Date: 04/27/2021 If Site Rite image not attached, placement could not be  confirmed due to current cardiac rhythm.    Discharge Exam: Vitals:   05/03/21 2006 05/04/21 0344  BP: (!) 149/59 (!) 145/66  Pulse: 74 85   Resp: 18 18  Temp: 99.6 F (37.6 C) 97.7 F (36.5 C)  SpO2: 93% 96%   Vitals:   05/03/21 1642 05/03/21 2006 05/04/21 0259 05/04/21 0344  BP: 138/61 (!) 149/59  (!) 145/66  Pulse: 68 74  85  Resp: 18 18  18   Temp: 98.1 F (36.7 C) 99.6 F (37.6 C)  97.7 F (36.5 C)  TempSrc: Oral Oral  Oral  SpO2: 98% 93%  96%  Weight:   96.1 kg   Height:        General: Pt is alert, awake, not in acute distress Cardiovascular: RRR, S1/S2 +, no rubs, no gallops Respiratory: Very faint rhonchi at the bases bilaterally. Abdominal: Soft, NT, ND, bowel sounds + Extremities: no edema, no cyanosis    The results of significant diagnostics from this hospitalization (including imaging, microbiology, ancillary and laboratory) are listed below for reference.     Microbiology: Recent Results (from the past 240 hour(s))  SARS CORONAVIRUS 2 (TAT 6-24 HRS) Nasopharyngeal Nasopharyngeal Swab     Status: None   Collection Time: 05/02/21  2:51 PM   Specimen: Nasopharyngeal Swab  Result Value Ref Range Status   SARS Coronavirus 2 NEGATIVE NEGATIVE Final    Comment: (NOTE) SARS-CoV-2 target nucleic acids are NOT DETECTED.  The SARS-CoV-2 RNA is generally detectable in upper and lower respiratory specimens during the acute phase of infection. Negative results do not preclude SARS-CoV-2 infection, do not rule out co-infections with other pathogens, and should not be used as the sole basis for treatment or other patient management decisions. Negative results must be combined with clinical observations, patient history, and epidemiological information. The expected result is Negative.  Fact Sheet for Patients: SugarRoll.be  Fact Sheet for Healthcare Providers: https://www.woods-mathews.com/  This test is not yet approved or cleared by the [place] FDA and  has been authorized for detection and/or diagnosis of SARS-CoV-2 by FDA under an Emergency Use  Authorization (EUA). This EUA will remain  in effect (meaning this test can be used) for the duration of the COVID-19 declaration under Se ction 564(b)(1) of the Act, 21 U.S.C. section 360bbb-3(b)(1), unless the authorization is terminated or revoked sooner.  Performed at [facility], [place] [address], [place], [place] [address]      Labs: BNP (last 3 results) Recent Labs    04/10/21 1535  BNP 734.1*   Basic Metabolic Panel: Recent Labs  Lab 04/28/21 0239 04/28/21 1331 04/29/21 0345 04/30/21 0334 05/03/21 0259  NA 142 151* 146* 142 136  K 4.7 3.0* 3.2* 3.5 3.6  CL 102 105 104 103 100  CO2 36* 36* 34* 31 27  GLUCOSE 476* 124* 107* 110* 148*  BUN 52* 48* 46* 42* 45*  CREATININE 2.72* 2.68* 2.55* 2.35* 2.27*  CALCIUM 7.8* 7.8* 8.2* 8.2* 8.2*  MG 2.3  --  2.4 2.2 2.1  PHOS 3.6  --  3.7  --   --    Liver Function Tests: Recent Labs  Lab 04/28/21 0239 04/29/21 0345  AST 13* 16  ALT 8 9  ALKPHOS 44 47  BILITOT 0.7 0.9  PROT 4.3* 4.7*  ALBUMIN 1.9* 2.1*   No results for input(s): LIPASE, AMYLASE in the last 168 hours. No results for input(s): AMMONIA in the last 168 hours. CBC: Recent Labs  Lab 04/28/21  2683 04/28/21 1331 04/29/21 0345 04/30/21 0334 05/03/21 0259  WBC 5.7  --  5.9 8.4 10.6*  NEUTROABS  --   --   --   --  8.8*  HGB 6.7* 9.5* 9.4* 9.1* 8.7*  HCT 21.5* 28.9* 28.8* 27.9* 26.3*  MCV 96.8  --  92.6 92.7 92.0  PLT 124*  --  112* 102* 120*   Cardiac Enzymes: No results for input(s): CKTOTAL, CKMB, CKMBINDEX, TROPONINI in the last 168 hours. BNP: Invalid input(s): POCBNP CBG: Recent Labs  Lab 05/03/21 1109 05/03/21 1613 05/03/21 1950 05/04/21 0508 05/04/21 0805  GLUCAP 252* 152* 85 123* 121*   D-Dimer No results for input(s): DDIMER in the last 72 hours. Hgb A1c No results for input(s): HGBA1C in the last 72 hours. Lipid Profile No results for input(s): CHOL, HDL, LDLCALC, TRIG, CHOLHDL, LDLDIRECT in the last 72 hours. Thyroid  function studies No results for input(s): TSH, T4TOTAL, T3FREE, THYROIDAB in the last 72 hours.  Invalid input(s): FREET3 Anemia work up No results for input(s): VITAMINB12, FOLATE, FERRITIN, TIBC, IRON, RETICCTPCT in the last 72 hours. Urinalysis    Component Value Date/Time   COLORURINE YELLOW 04/11/2021 0814   APPEARANCEUR HAZY (A) 04/11/2021 0814   APPEARANCEUR Clear 01/01/2021 1448   LABSPEC 1.010 04/11/2021 0814   PHURINE 6.0 04/11/2021 [place] 04/11/2021 0814   HGBUR NEGATIVE 04/11/2021 0814   BILIRUBINUR NEGATIVE 04/11/2021 0814   BILIRUBINUR Negative 01/01/2021 [facility] 04/11/2021 0814   PROTEINUR NEGATIVE 04/11/2021 0814   NITRITE NEGATIVE 04/11/2021 0814   LEUKOCYTESUR NEGATIVE 04/11/2021 0814   Sepsis Labs Invalid input(s): PROCALCITONIN,  WBC,  LACTICIDVEN Microbiology Recent Results (from the past 240 hour(s))  SARS CORONAVIRUS 2 (TAT 6-24 HRS) Nasopharyngeal Nasopharyngeal Swab     Status: None   Collection Time: 05/02/21  2:51 PM   Specimen: Nasopharyngeal Swab  Result Value Ref Range Status   SARS Coronavirus 2 NEGATIVE NEGATIVE Final    Comment: (NOTE) SARS-CoV-2 target nucleic acids are NOT DETECTED.  The SARS-CoV-2 RNA is generally detectable in upper and lower respiratory specimens during the acute phase of infection. Negative results do not preclude SARS-CoV-2 infection, do not rule out co-infections with other pathogens, and should not be used as the sole basis for treatment or other patient management decisions. Negative results must be combined with clinical observations, patient history, and epidemiological information. The expected result is Negative.  Fact Sheet for Patients: SugarRoll.be  Fact Sheet for Healthcare Providers: https://www.woods-mathews.com/  This test is not yet approved or cleared by the [place] FDA and  has been authorized for detection  and/or diagnosis of SARS-CoV-2 by FDA under an Emergency Use Authorization (EUA). This EUA will remain  in effect (meaning this test can be used) for the duration of the COVID-19 declaration under Se ction 564(b)(1) of the Act, 21 U.S.C. section 360bbb-3(b)(1), unless the authorization is terminated or revoked sooner.  Performed at [facility], [place] [address], [place], [place] [address]      Time coordinating discharge: Over 30 minutes  SIGNED:   Darliss Cheney, MD  [facility] 05/04/2021, 8:14 AM  If 7PM-7AM, please contact night-coverage [email or web address]

## 2021-05-04 DIAGNOSIS — C61 Malignant neoplasm of prostate: Secondary | ICD-10-CM | POA: Diagnosis not present

## 2021-05-04 DIAGNOSIS — B9681 Helicobacter pylori [H. pylori] as the cause of diseases classified elsewhere: Secondary | ICD-10-CM | POA: Diagnosis not present

## 2021-05-04 DIAGNOSIS — R262 Difficulty in walking, not elsewhere classified: Secondary | ICD-10-CM | POA: Diagnosis not present

## 2021-05-04 DIAGNOSIS — R1312 Dysphagia, oropharyngeal phase: Secondary | ICD-10-CM | POA: Diagnosis not present

## 2021-05-04 DIAGNOSIS — R41841 Cognitive communication deficit: Secondary | ICD-10-CM | POA: Diagnosis not present

## 2021-05-04 DIAGNOSIS — N183 Chronic kidney disease, stage 3 unspecified: Secondary | ICD-10-CM | POA: Diagnosis not present

## 2021-05-04 DIAGNOSIS — R4182 Altered mental status, unspecified: Secondary | ICD-10-CM | POA: Diagnosis not present

## 2021-05-04 DIAGNOSIS — Z7401 Bed confinement status: Secondary | ICD-10-CM | POA: Diagnosis not present

## 2021-05-04 DIAGNOSIS — I10 Essential (primary) hypertension: Secondary | ICD-10-CM | POA: Diagnosis not present

## 2021-05-04 DIAGNOSIS — E119 Type 2 diabetes mellitus without complications: Secondary | ICD-10-CM | POA: Diagnosis not present

## 2021-05-04 DIAGNOSIS — K259 Gastric ulcer, unspecified as acute or chronic, without hemorrhage or perforation: Secondary | ICD-10-CM | POA: Diagnosis not present

## 2021-05-04 DIAGNOSIS — R531 Weakness: Secondary | ICD-10-CM | POA: Diagnosis not present

## 2021-05-04 DIAGNOSIS — E785 Hyperlipidemia, unspecified: Secondary | ICD-10-CM | POA: Diagnosis not present

## 2021-05-04 DIAGNOSIS — R404 Transient alteration of awareness: Secondary | ICD-10-CM | POA: Diagnosis not present

## 2021-05-04 DIAGNOSIS — J690 Pneumonitis due to inhalation of food and vomit: Secondary | ICD-10-CM | POA: Diagnosis not present

## 2021-05-04 DIAGNOSIS — K567 Ileus, unspecified: Secondary | ICD-10-CM | POA: Diagnosis not present

## 2021-05-04 DIAGNOSIS — I5030 Unspecified diastolic (congestive) heart failure: Secondary | ICD-10-CM | POA: Diagnosis not present

## 2021-05-04 DIAGNOSIS — M6281 Muscle weakness (generalized): Secondary | ICD-10-CM | POA: Diagnosis not present

## 2021-05-04 LAB — GLUCOSE, CAPILLARY
Glucose-Capillary: 123 mg/dL — ABNORMAL HIGH (ref 70–99)
Glucose-Capillary: 121 mg/dL — ABNORMAL HIGH (ref 70–99)

## 2021-05-05 DIAGNOSIS — E785 Hyperlipidemia, unspecified: Secondary | ICD-10-CM | POA: Diagnosis not present

## 2021-05-05 DIAGNOSIS — K567 Ileus, unspecified: Secondary | ICD-10-CM | POA: Diagnosis not present

## 2021-05-05 DIAGNOSIS — E119 Type 2 diabetes mellitus without complications: Secondary | ICD-10-CM | POA: Diagnosis not present

## 2021-05-05 DIAGNOSIS — B9681 Helicobacter pylori [H. pylori] as the cause of diseases classified elsewhere: Secondary | ICD-10-CM | POA: Diagnosis not present

## 2021-05-05 DIAGNOSIS — R531 Weakness: Secondary | ICD-10-CM | POA: Diagnosis not present

## 2021-05-05 DIAGNOSIS — I10 Essential (primary) hypertension: Secondary | ICD-10-CM | POA: Diagnosis not present

## 2021-05-05 DIAGNOSIS — I5030 Unspecified diastolic (congestive) heart failure: Secondary | ICD-10-CM | POA: Diagnosis not present

## 2021-05-05 DIAGNOSIS — K259 Gastric ulcer, unspecified as acute or chronic, without hemorrhage or perforation: Secondary | ICD-10-CM | POA: Diagnosis not present

## 2021-05-06 DIAGNOSIS — J690 Pneumonitis due to inhalation of food and vomit: Secondary | ICD-10-CM | POA: Diagnosis not present

## 2021-05-07 ENCOUNTER — Ambulatory Visit: Payer: Medicare HMO | Admitting: Urology

## 2021-05-19 ENCOUNTER — Other Ambulatory Visit (HOSPITAL_COMMUNITY): Payer: Self-pay | Admitting: Specialist

## 2021-05-19 DIAGNOSIS — R1319 Other dysphagia: Secondary | ICD-10-CM

## 2021-05-19 DIAGNOSIS — R6884 Jaw pain: Secondary | ICD-10-CM

## 2021-05-19 DIAGNOSIS — R1310 Dysphagia, unspecified: Secondary | ICD-10-CM

## 2021-05-20 ENCOUNTER — Encounter (HOSPITAL_COMMUNITY): Payer: Self-pay | Admitting: Speech Pathology

## 2021-05-20 ENCOUNTER — Ambulatory Visit (HOSPITAL_COMMUNITY)
Admission: RE | Admit: 2021-05-20 | Discharge: 2021-05-20 | Disposition: A | Discharge status: Home / Self Care | Payer: No Typology Code available for payment source | Source: Ambulatory Visit | Attending: Family Medicine | Admitting: Family Medicine

## 2021-05-20 ENCOUNTER — Other Ambulatory Visit: Payer: Self-pay

## 2021-05-20 ENCOUNTER — Ambulatory Visit (HOSPITAL_COMMUNITY): Payer: Medicare Other | Attending: Family Medicine | Admitting: Speech Pathology

## 2021-05-20 DIAGNOSIS — R1319 Other dysphagia: Secondary | ICD-10-CM | POA: Insufficient documentation

## 2021-05-20 DIAGNOSIS — R1310 Dysphagia, unspecified: Secondary | ICD-10-CM | POA: Insufficient documentation

## 2021-05-20 DIAGNOSIS — R6884 Jaw pain: Secondary | ICD-10-CM | POA: Insufficient documentation

## 2021-05-20 DIAGNOSIS — R1312 Dysphagia, oropharyngeal phase: Secondary | ICD-10-CM | POA: Insufficient documentation

## 2021-05-20 NOTE — Therapy (Signed)
[place], [place], [address] Phone: [phone]   Fax:  [phone]  Modified Barium Swallow  Patient Details  Name: Thomas Duarte MRN: [id number] Date of Birth: [birth year] No data recorded  Encounter Date: 05/20/2021   End of Session - 05/20/21 1510     Visit Number 1    Number of Visits 1    Authorization Type [employer]    SLP Start Time 1330    SLP Stop Time  1400    SLP Time Calculation (min) 30 min             Past Medical History:  Diagnosis Date   BPH (benign prostatic hyperplasia)    Diabetes mellitus without complication ([place])    Hypercholesteremia    Hypertension    Stroke ([place]) 2019   x2; numbness of right thumb and pointer and middle finger, no other deficits    Past Surgical History:  Procedure Laterality Date   CATARACT EXTRACTION W/PHACO Left 10/21/2018   Procedure: CATARACT EXTRACTION PHACO AND INTRAOCULAR LENS PLACEMENT LEFT EYE;  Surgeon: Baruch Goldmann, MD;  Location: [facility];  Service: Ophthalmology;  Laterality: Left;  CDE: 5.18   CATARACT EXTRACTION W/PHACO Right 06/23/2019   Procedure: CATARACT EXTRACTION PHACO AND INTRAOCULAR LENS PLACEMENT (IOC);  Surgeon: Baruch Goldmann, MD;  Location: [facility];  Service: Ophthalmology;  Laterality: Right;  CDE: 15.0   ESOPHAGOGASTRODUODENOSCOPY (EGD) WITH PROPOFOL N/A 04/28/2021   Procedure: ESOPHAGOGASTRODUODENOSCOPY (EGD) WITH PROPOFOL;  Surgeon: Sharyn Creamer, MD;  Location: [facility];  Service: Gastroenterology;  Laterality: N/A;   EXPLORATORY LAPAROTOMY     LOA, 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 requests time 1:00   HERNIA REPAIR Left 1960   Inguinal   IR GENERIC HISTORICAL  04/28/2016   IR RADIOLOGIST EVAL & MGMT 04/28/2016  [facility]   SPACE OAR INSTILLATION N/A 12/16/2020   Procedure: SPACE OAR INSTILLATION;  Surgeon: Cleon Gustin, MD;  Location: [facility];  Service: Urology;  Laterality: N/A;   TONSILLECTOMY  1952   adenoidectomy    There were no vitals filed for this visit.   Subjective Assessment - 05/20/21 1457     Subjective "Sometimes I cough when I am eating and drinking."    Special Tests MBSS    Currently in Pain? No/denies                 General - 05/20/21 1459       General Information   Date of Onset 05/02/21    HPI Patient is a 80 years old male referred by Dr. 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 Recent EGD revealed gastric ulcers, esophagitis and NG tube trauma  X-ray of the abdomen showed some ileus-like features; general surgery consulted and pt treated. He was [facility] from [facility] to [facility] on D2 and NTL with recommendation for MBSS.    Type of Study MBS-Modified Barium Swallow Study    Previous Swallow Assessment BSE a few weeks ago    Diet Prior to this Study  Dysphagia 1 (puree);Thin liquids    Temperature Spikes Noted No    Respiratory Status Room air    History of Recent Intubation No    Behavior/Cognition Alert;Cooperative;Pleasant mood    Oral Cavity Assessment Within Functional Limits    Oral Care Completed by SLP No    Oral Cavity - Dentition Dentures, top;Missing dentition    Vision Functional for self feeding    Self-Feeding Abilities Able to feed self    Patient Positioning Upright in chair    Baseline Vocal Quality Breathy;Low vocal intensity    Volitional Cough Weak    Volitional Swallow Able to elicit    Anatomy  Within functional limits    Pharyngeal Secretions Not observed secondary MBS                Oral Preparation/Oral Phase - 05/20/21 1502       Oral Preparation/Oral Phase   Oral Phase Impaired      Oral - Thin   Oral - Thin Teaspoon Weak ligual manipulation;Decreased bolus cohesion;Oral residue;Piecemeal swallowing    Oral - Thin Cup Weak ligual manipulation;Decreased bolus cohesion;Piecemeal swallowing;Oral residue    Oral - Thin Straw Oral residue      Oral - Solids   Oral - Puree Decreased bolus cohesion;Piecemeal swallowing;Oral residue    Oral - Pill Decreased bolus cohesion;Piecemeal swallowing      Electrical stimulation - Oral Phase   Was Electrical Stimulation Used No              Pharyngeal Phase - 05/20/21 1503       Pharyngeal Phase   Pharyngeal Phase Impaired      Pharyngeal - Thin   Pharyngeal- Thin Teaspoon Swallow initiation at vallecula;Reduced tongue base retraction;Pharyngeal residue - valleculae;Pharyngeal residue - pyriform;Lateral channel residue    Pharyngeal- Thin Cup Swallow initiation at vallecula;Reduced tongue base retraction;Pharyngeal residue - valleculae;Pharyngeal residue - pyriform;Lateral channel residue;Swallow initiation at pyriform sinus    Pharyngeal- Thin Straw Swallow initiation at pyriform sinus;Reduced tongue base retraction;Pharyngeal residue - valleculae;Lateral channel residue;Pharyngeal residue - pyriform      Pharyngeal - Solids   Pharyngeal- Puree Swallow initiation at vallecula;Pharyngeal residue - valleculae;Reduced tongue base retraction    Pharyngeal- Regular Delayed swallow initiation-vallecula;Reduced tongue base retraction;Pharyngeal residue - valleculae    Pharyngeal- Pill Penetration/Aspiration during swallow   Pt penetrated the thins when taking the pill   Pharyngeal Material enters airway, remains ABOVE vocal cords and not ejected out      Electrical Stimulation - Pharyngeal Phase   Was Electrical  Stimulation Used No              Cricopharyngeal Phase - 05/20/21 1509       Cervical Esophageal Phase   Cervical Esophageal Phase Within functional limits               Plan - 05/20/21 1511     Clinical Impression Statement Pt presents with moderate oropharyngeal phase dysphagia characterized by impaired lingual movement resulting in reduced bolus cohesiveness, piecemeal deglutition, premature spillage, lingual residue; reduced tongue base retraction resulting in min vallecular residue, however oral residuals then spill to the valleculae and are inconsistently sensed and spill to lateral channels and pyriforms. Pt is at risk for aspiration of pooled residuals and needed cues to "swallow again" to clear his pharynx. He took one sip of liquid, but swallowed three times to clear oral cavity and then needed cue to swallow again for residuals in the pharynx. Pt with penetration  of thins during sequential cup sips of thin to swallow the barium tablet. This appeared to just reach the cords and he spontaneously cleared his throat which did not completely expel from laryngeal vestibule. He was cued to cough strongly, which then cleared the laryngeal vestibule. No significant pharyngeal residue noted with purees and solids. Recommend D3/mech soft with treating SLP to advance to regular textures as clinically appropriate and thin liquids with cues to take small sips and complete "dry swallows" to clear pharynx. Pt does wear upper dentures and he indicates they fit him well, however SLP questions whether this impacts his feedback/proprioception with liquids in his oral cavity. SLP will fax report to [facility] [place].   Treatment/Interventions Patient/family education             Patient will benefit from skilled therapeutic intervention in order to improve the following deficits and impairments:   Dysphagia, oropharyngeal phase     Recommendations/Treatment - 05/20/21 1509        Swallow Evaluation Recommendations   SLP Diet Recommendations Dysphagia 3 (mechanical soft);Age appropriate regular;Thin    Liquid Administration via Cup    Medication Administration Whole meds with puree    Supervision Patient able to self feed    Compensations Small sips/bites;Multiple dry swallows after each bite/sip;Effortful swallow    Postural Changes Seated upright at 90 degrees;Remain upright for at least 30 minutes after feeds/meals              Prognosis - 05/20/21 1510       Prognosis   Prognosis for Safe Diet Advancement Fair    Barriers to Reach Goals Severity of deficits      Individuals Consulted   Consulted and Agree with Results and Recommendations Patient    Report Sent to  Primary SLP;Facility (Comment)             Problem List Patient Active Problem List   Diagnosis Date Noted   Gastric ulcer 05/03/2021   Esophagitis 05/03/2021   Acute urinary retention 05/03/2021   Dysphagia 05/03/2021   CKD (chronic kidney disease), stage IV ([place]) 05/03/2021   SBO (small bowel obstruction) ([place]) 05/03/2021   Ileus ([place]) 05/03/2021   Aspiration pneumonia ([place]) 05/03/2021   Malnutrition of moderate degree 04/29/2021   Hypertension    Diabetes mellitus without complication ([place])    AKI (acute kidney injury) ([place]) 04/10/2021   Prostate cancer ([place]) 09/09/2020   General weakness 2019   Thank you,  Genene Churn, [place]  Batool Majid 05/20/2021, 3:12 PM  [place], [place], [address] Phone: [phone]   Fax:  [phone]  Name: Thomas Duarte MRN: [id number] Date of Birth: 1940/11/03

## 2021-05-21 ENCOUNTER — Other Ambulatory Visit: Payer: Self-pay

## 2021-05-21 ENCOUNTER — Encounter (HOSPITAL_COMMUNITY): Payer: Self-pay | Admitting: Emergency Medicine

## 2021-05-21 ENCOUNTER — Emergency Department (HOSPITAL_COMMUNITY)
Admission: EM | Admit: 2021-05-21 | Discharge: 2021-05-22 | Disposition: A | Discharge status: Home / Self Care | Payer: Medicare Other | Attending: Emergency Medicine | Admitting: Emergency Medicine

## 2021-05-21 DIAGNOSIS — Z7984 Long term (current) use of oral hypoglycemic drugs: Secondary | ICD-10-CM | POA: Diagnosis not present

## 2021-05-21 DIAGNOSIS — E119 Type 2 diabetes mellitus without complications: Secondary | ICD-10-CM | POA: Insufficient documentation

## 2021-05-21 DIAGNOSIS — I129 Hypertensive chronic kidney disease with stage 1 through stage 4 chronic kidney disease, or unspecified chronic kidney disease: Secondary | ICD-10-CM | POA: Diagnosis not present

## 2021-05-21 DIAGNOSIS — N184 Chronic kidney disease, stage 4 (severe): Secondary | ICD-10-CM | POA: Diagnosis not present

## 2021-05-21 DIAGNOSIS — Z7901 Long term (current) use of anticoagulants: Secondary | ICD-10-CM | POA: Insufficient documentation

## 2021-05-21 DIAGNOSIS — I82412 Acute embolism and thrombosis of left femoral vein: Secondary | ICD-10-CM | POA: Insufficient documentation

## 2021-05-21 DIAGNOSIS — Z79899 Other long term (current) drug therapy: Secondary | ICD-10-CM | POA: Insufficient documentation

## 2021-05-21 DIAGNOSIS — Z8546 Personal history of malignant neoplasm of prostate: Secondary | ICD-10-CM | POA: Diagnosis not present

## 2021-05-21 DIAGNOSIS — M79662 Pain in left lower leg: Secondary | ICD-10-CM | POA: Diagnosis present

## 2021-05-21 LAB — CBC WITH DIFFERENTIAL/PLATELET
Abs Immature Granulocytes: 0.03 10*3/uL (ref 0.00–0.07)
Basophils Absolute: 0 10*3/uL (ref 0.0–0.1)
Basophils Relative: 1 %
Eosinophils Absolute: 0.3 10*3/uL (ref 0.0–0.5)
Eosinophils Relative: 7 %
HCT: 25.8 % — ABNORMAL LOW (ref 39.0–52.0)
Hemoglobin: 8.1 g/dL — ABNORMAL LOW (ref 13.0–17.0)
Immature Granulocytes: 1 %
Lymphocytes Relative: 14 %
Lymphs Abs: 0.7 10*3/uL (ref 0.7–4.0)
MCH: 30.2 pg (ref 26.0–34.0)
MCHC: 31.4 g/dL (ref 30.0–36.0)
MCV: 96.3 fL (ref 80.0–100.0)
Monocytes Absolute: 0.7 10*3/uL (ref 0.1–1.0)
Monocytes Relative: 15 %
Neutro Abs: 3 10*3/uL (ref 1.7–7.7)
Neutrophils Relative %: 62 %
Platelets: 171 10*3/uL (ref 150–400)
RBC: 2.68 MIL/uL — ABNORMAL LOW (ref 4.22–5.81)
RDW: 15.6 % — ABNORMAL HIGH (ref 11.5–15.5)
WBC: 4.7 10*3/uL (ref 4.0–10.5)
nRBC: 0 % (ref 0.0–0.2)

## 2021-05-21 LAB — PROTIME-INR
INR: 1.1 (ref 0.8–1.2)
Prothrombin Time: 14.4 seconds (ref 11.4–15.2)

## 2021-05-21 LAB — BASIC METABOLIC PANEL
Anion gap: 6 (ref 5–15)
BUN: 35 mg/dL — ABNORMAL HIGH (ref 8–23)
CO2: 25 mmol/L (ref 22–32)
Calcium: 8.9 mg/dL (ref 8.9–10.3)
Chloride: 105 mmol/L (ref 98–111)
Creatinine, Ser: 1.93 mg/dL — ABNORMAL HIGH (ref 0.61–1.24)
GFR, Estimated: 34 mL/min — ABNORMAL LOW (ref >60)
Glucose, Bld: 118 mg/dL — ABNORMAL HIGH (ref 70–99)
Potassium: 4.4 mmol/L (ref 3.5–5.1)
Sodium: 136 mmol/L (ref 135–145)

## 2021-05-21 LAB — POC OCCULT BLOOD, ED: Fecal Occult Bld: NEGATIVE

## 2021-05-21 MED ORDER — APIXABAN 2.5 MG PO TABS
2.5000 mg | ORAL_TABLET | Freq: Once | ORAL | Status: AC
  Administered 2021-05-21: 2.5 mg via ORAL
  Filled 2021-05-21: qty 1

## 2021-05-21 MED ORDER — APIXABAN 2.5 MG PO TABS: ORAL_TABLET | ORAL | 0 refills | Status: AC

## 2021-05-21 NOTE — ED Triage Notes (Signed)
Pt arrived VIA [facility] from [facility]. PT has had left leg pain x2weeks. Pt had a DVT study today showing a Acute occlusive left distal femoral to popliteal DVT. Pt was sent in for tx of the DVT per EMS. Scan studies at bedside.

## 2021-05-21 NOTE — ED Provider Notes (Signed)
[facility] EMERGENCY DEPARTMENT Provider Note   CSN: [id number] Arrival date & time: 05/21/21  1351     History Chief Complaint  Patient presents with   Leg Pain    Thomas Duarte is a 80 y.o. male.  HPI  Patient with symptom medical history of chronic diastolic heart failure, PVCs, hypertension, type 2 diabetes, CKD, stroke, BPH presents with chief complaint of positive DVT study, a photo of study was placed in patient's chart.  Patient states that he has been having pain on his left lower calf, going on for last 3 weeks, states the pain was originally intermittent, but over the last week the pain is got more persistent.  States the pain remains along his left calf, does not radiate, denies any paresthesia or weakness of the lower extremities.  Denies any recent trauma to area, history of DVTs or PEs, never had this in the past.  Patient denies any chest pain, shortness of breath, orthopnea, dyspnea on exertion.   After reviewing patient's charts patient was recent discharged from the hospital on 09/18 for acute blood loss likely from upper GI bleed from severe esophagitis.  He had EGD done which showed esophagitis without bleeding, nonbleeding gastric ulcers, gastritis.  He received a total of 3 units of blood and was later discharged home.  Appears that patient saw his cardiologist Ass[place] on 10/03 he noted that he had worsening swelling in his left leg and ordered a DVT study for further evaluation.  Past Medical History:  Diagnosis Date   BPH (benign prostatic hyperplasia)    Diabetes mellitus without complication ([place])    Hypercholesteremia    Hypertension    Stroke ([place]) 2019   x2; numbness of right thumb and pointer and middle finger, no other deficits    Patient Active Problem List   Diagnosis Date Noted   Gastric ulcer 05/03/2021   Esophagitis 05/03/2021   Acute urinary retention 05/03/2021   Dysphagia 05/03/2021   CKD (chronic kidney disease), stage IV ([place])  05/03/2021   SBO (small bowel obstruction) ([place]) 05/03/2021   Ileus ([place]) 05/03/2021   Aspiration pneumonia ([place]) 05/03/2021   Malnutrition of moderate degree 04/29/2021   Hypertension    Diabetes mellitus without complication ([place])    AKI (acute kidney injury) ([place]) 04/10/2021   Prostate cancer ([place]) 09/09/2020   General weakness 2019    Past Surgical History:  Procedure Laterality Date   CATARACT EXTRACTION W/PHACO Left 10/21/2018   Procedure: CATARACT EXTRACTION PHACO AND INTRAOCULAR LENS PLACEMENT LEFT EYE;  Surgeon: Baruch Goldmann, MD;  Location: [facility];  Service: Ophthalmology;  Laterality: Left;  CDE: 5.18   CATARACT EXTRACTION W/PHACO Right 06/23/2019   Procedure: CATARACT EXTRACTION PHACO AND INTRAOCULAR LENS PLACEMENT (IOC);  Surgeon: Baruch Goldmann, MD;  Location: [facility];  Service: Ophthalmology;  Laterality: Right;  CDE: 15.0   ESOPHAGOGASTRODUODENOSCOPY (EGD) WITH PROPOFOL N/A 04/28/2021   Procedure: ESOPHAGOGASTRODUODENOSCOPY (EGD) WITH PROPOFOL;  Surgeon: Sharyn Creamer, MD;  Location: [place];  Service: Gastroenterology;  Laterality: N/A;   EXPLORATORY LAPAROTOMY     LOA, 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 requests time 1:00   HERNIA REPAIR Left 1960   Inguinal   IR GENERIC HISTORICAL  04/28/2016   IR RADIOLOGIST EVAL & MGMT 04/28/2016 [facility]-INTERV RAD   SPACE OAR INSTILLATION N/A  12/16/2020   Procedure: SPACE OAR INSTILLATION;  Surgeon: Cleon Gustin, MD;  Location: [facility];  Service: Urology;  Laterality: N/A;   TONSILLECTOMY  1952   adenoidectomy       History reviewed. No pertinent family history.  Social History   Tobacco Use   Smoking status: Never   Smokeless tobacco: Never  Vaping Use   Vaping Use: Never used  Substance Use Topics   Alcohol use:  Never   Drug use: Never    Home Medications Prior to Admission medications   Medication Sig Start Date End Date Taking? Authorizing Provider  acetaminophen (TYLENOL) 325 MG tablet Take 650 mg by mouth every 4 (four) hours as needed for mild pain or fever.   Yes [provider]  amLODipine (NORVASC) 10 MG tablet Take 1 tablet (10 mg total) by mouth daily. 04/16/21  Yes Shelly Coss, MD  apixaban (ELIQUIS) 2.5 MG TABS tablet Take 2 tablets (5mg ) twice daily for 6 days, then 1 tablet (2.5mg ) twice daily 05/21/21  Yes Marcello Fennel, PA-C  furosemide (LASIX) 40 MG tablet Take 1 tablet (40 mg total) by mouth daily. 04/16/21  Yes Adhikari, Tamsen Meek, MD  KLOR-CON M10 10 MEQ tablet Take 10 mEq by mouth 2 (two) times daily. 05/03/20  Yes [provider]  metFORMIN (GLUCOPHAGE) 500 MG tablet Take 500 mg by mouth daily with breakfast.   Yes [provider]  metoprolol succinate (TOPROL-XL) 25 MG 24 hr tablet Take 25 mg by mouth daily. 05/09/20  Yes [provider]  Multiple Vitamin (MULTIVITAMIN ADULT PO) Take 1 tablet by mouth daily.   Yes [provider]  Nutritional Supplements (PROMOD) LIQD Take 30 mLs by mouth in the morning and at bedtime.   Yes [provider]  pantoprazole (PROTONIX) 40 MG tablet Take 1 tablet (40 mg total) by mouth 2 (two) times daily. 05/03/21 06/27/21 Yes Pahwani, Einar Grad, MD  polyvinyl alcohol (LIQUIFILM TEARS) 1.4 % ophthalmic solution Place 1 drop into both eyes every 3 (three) hours as needed for dry eyes.   Yes [provider]  sodium bicarbonate 650 MG tablet Take 1 tablet (650 mg total) by mouth 3 (three) times daily. 04/16/21  Yes Shelly Coss, MD  sucralfate (CARAFATE) 1 g tablet Take 1 tablet (1 g total) by mouth 4 (four) times daily -  with meals and at bedtime for 25 days. 05/03/21 05/28/21 Yes Pahwani, Einar Grad, MD  tamsulosin (FLOMAX) 0.4 MG CAPS capsule Take 0.8 mg by mouth at bedtime.    Yes [provider]  tobramycin (TOBREX) 0.3 % ophthalmic solution Place 1 drop into both eyes every 4 (four) hours as needed (after eye injections until healed).   Yes [provider]  vitamin C (ASCORBIC ACID) 500 MG tablet Take 500 mg by mouth 2 (two) times daily.   Yes [provider]  Lancets (ONETOUCH DELICA PLUS [id number]) [place] SMARTSIG:1 Strip(s) Topical Daily 08/29/20   [provider]  [facility] VERIO test strip 1 each daily. 10/03/20   [provider]    Allergies    Cucumber extract  Review of Systems   Review of Systems  Constitutional:  Negative for chills and fever.  HENT:  Negative for congestion.   Respiratory:  Negative for shortness of breath.   Cardiovascular:  Negative for chest pain.  Gastrointestinal:  Negative for abdominal pain.  Genitourinary:  Negative for enuresis.  Musculoskeletal:  Negative for back pain.       Left-sided calf  pain.  Skin:  Negative for rash.  Neurological:  Negative for dizziness.  Hematological:  Does not bruise/bleed easily.   Physical Exam Updated Vital Signs BP 123/77   Pulse 79   Temp 98.3 F (36.8 C) (Oral)   Resp (!) 21   Ht 6\' 1"  (1.854 m)   Wt 86.2 kg   SpO2 99%   BMI 25.07 kg/m   Physical Exam Vitals and nursing note reviewed.  Constitutional:      General: He is not in acute distress.    Appearance: He is not ill-appearing.  HENT:     Head: Normocephalic and atraumatic.     Nose: No congestion.  Eyes:     Conjunctiva/sclera: Conjunctivae normal.  Cardiovascular:     Rate and Rhythm: Normal rate and regular rhythm.     Pulses: Normal pulses.     Heart sounds: No murmur heard.   No friction rub. No gallop.  Pulmonary:     Effort: No respiratory distress.     Breath sounds: No wheezing, rhonchi or rales.  Abdominal:     Palpations: Abdomen is soft.     Tenderness: There is no abdominal tenderness. There is no right CVA tenderness or left CVA tenderness.  Musculoskeletal:      Right lower leg: No edema.     Left lower leg: Edema present.     Comments: Patient has noted left leg edema, larger than the right side, no pitting edema present, no lymphedema skin changes noted, he has full range of motion, 5 of 5 strength, neurovascular tact in lower extremities.  Patient was tender in his left calf, no palpable cords noted.  Skin:    General: Skin is warm and dry.  Neurological:     Mental Status: He is alert.  Psychiatric:        Mood and Affect: Mood normal.    ED Results / Procedures / Treatments   Labs (all labs ordered are listed, but only abnormal results are displayed) Labs Reviewed  CBC WITH DIFFERENTIAL/PLATELET - Abnormal; Notable for the following components:      Result Value   RBC 2.68 (*)    Hemoglobin 8.1 (*)    HCT 25.8 (*)    RDW 15.6 (*)    All other components within normal limits  BASIC METABOLIC PANEL - Abnormal; Notable for the following components:   Glucose, Bld 118 (*)    BUN 35 (*)    Creatinine, Ser 1.93 (*)    GFR, Estimated 34 (*)    All other components within normal limits  PROTIME-INR  POC OCCULT BLOOD, ED    EKG None  Radiology DG OP Swallowing Func-Medicare/Speech Path  Result Date: 05/20/2021 Table formatting from the original result was not included. Images from the original result were not included. [place], [place], [address] Phone: [phone]   Fax:  [phone]   Modified Barium Swallow   Patient Details Name: Thomas Duarte MRN: [id number] Date of Birth: 08-28-1940 No data recorded   Encounter Date: 05/20/2021     End of Session - 05/20/21 1510      Visit Number 1    Number of Visits 1    Authorization Type [employer]    SLP Start Time 1330    SLP Stop Time  1400    SLP Time Calculation (min) 30 min  Past Medical History: Diagnosis Date  BPH (benign prostatic hyperplasia)    Diabetes mellitus without complication ([place])     Hypercholesteremia    Hypertension    Stroke ([place]) 2019   x2; numbness of right thumb and pointer and middle finger, no other deficits         Past Surgical History: Procedure Laterality Date  CATARACT EXTRACTION W/PHACO Left 10/21/2018   Procedure: CATARACT EXTRACTION PHACO AND INTRAOCULAR LENS PLACEMENT LEFT EYE;  Surgeon: Baruch Goldmann, MD;  Location: [facility];  Service: Ophthalmology;  Laterality: Left;  CDE: 5.18  CATARACT EXTRACTION W/PHACO Right 06/23/2019   Procedure: CATARACT EXTRACTION PHACO AND INTRAOCULAR LENS PLACEMENT (IOC);  Surgeon: Baruch Goldmann, MD;  [facility];  Service: Ophthalmology;  Laterality: Right;  CDE: 15.0  ESOPHAGOGASTRODUODENOSCOPY (EGD) WITH PROPOFOL N/A 04/28/2021   Procedure: ESOPHAGOGASTRODUODENOSCOPY (EGD) WITH PROPOFOL;  Surgeon: Sharyn Creamer, MD;  Location: [place];  Service: Gastroenterology;  Laterality: N/A;  EXPLORATORY LAPAROTOMY       LOA, 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 requests time 1:00  HERNIA REPAIR Left 1960   Inguinal  IR GENERIC HISTORICAL   04/28/2016   IR RADIOLOGIST EVAL & MGMT 04/28/2016 [facility]-INTERV RAD  SPACE OAR INSTILLATION N/A 12/16/2020   Procedure: SPACE OAR INSTILLATION;  Surgeon: Cleon Gustin, MD;  Location: [facility];  Service: Urology;  Laterality: N/A;  TONSILLECTOMY   1952   adenoidectomy     There were no vitals filed for this visit.     Subjective Assessment - 05/20/21 1457      Subjective "Sometimes I cough when I am eating and drinking."    Special Tests MBSS    Currently in Pain? No/denies               ?  ?      General - 05/20/21 1459             General Information   Date of Onset 05/02/21    HPI Patient is a 80 years old male referred by Dr. Monico Blitz, with past medical history of chronic diastolic heart failure, frequent PVCs  on beta-blocker, hypertension, type 2 diabetes, hyperlipidemia, chronic kidney disease stage IIIb, stroke, BPH, presented to hospital with bilateral lower extremity weakness and difficulty ambulating On 04/21/2021, patient started having multiple episodes of nausea, vomiting. Recent EGD revealed gastric ulcers, esophagitis and NG tube trauma  X-ray of the abdomen showed some ileus-like features; general surgery consulted and pt treated. He was discharged from [facility] to [facility] on D2 and NTL with recommendation for MBSS.    Type of Study MBS-Modified Barium Swallow Study    Previous Swallow Assessment BSE a few weeks ago    Diet Prior to this Study Dysphagia 1 (puree);Thin liquids    Temperature Spikes Noted No    Respiratory Status Room air    History of Recent Intubation No    Behavior/Cognition Alert;Cooperative;Pleasant mood    Oral Cavity Assessment Within Functional Limits    Oral Care Completed by SLP No    Oral Cavity - Dentition Dentures, top;Missing dentition    Vision Functional for self feeding    Self-Feeding Abilities Able to feed self    Patient Positioning Upright in chair    Baseline Vocal Quality Breathy;Low vocal intensity  Volitional Cough Weak    Volitional Swallow Able to elicit    Anatomy Within functional limits    Pharyngeal Secretions Not observed secondary MBS               ?      Oral Preparation/Oral Phase - 05/20/21 1502             Oral Preparation/Oral Phase   Oral Phase Impaired        Oral - Thin   Oral - Thin Teaspoon Weak ligual manipulation;Decreased bolus cohesion;Oral residue;Piecemeal swallowing    Oral - Thin Cup Weak ligual manipulation;Decreased bolus cohesion;Piecemeal swallowing;Oral residue    Oral - Thin Straw Oral residue        Oral - Solids   Oral - Puree Decreased bolus cohesion;Piecemeal swallowing;Oral residue    Oral - Pill Decreased bolus cohesion;Piecemeal swallowing        Electrical stimulation - Oral Phase   Was Electrical Stimulation Used No                  Pharyngeal Phase - 05/20/21 1503             Pharyngeal Phase   Pharyngeal Phase Impaired        Pharyngeal - Thin   Pharyngeal- Thin Teaspoon Swallow initiation at vallecula;Reduced tongue base retraction;Pharyngeal residue - valleculae;Pharyngeal residue - pyriform;Lateral channel residue    Pharyngeal- Thin Cup Swallow initiation at vallecula;Reduced tongue base retraction;Pharyngeal residue - valleculae;Pharyngeal residue - pyriform;Lateral channel residue;Swallow initiation at pyriform sinus    Pharyngeal- Thin Straw Swallow initiation at pyriform sinus;Reduced tongue base retraction;Pharyngeal residue - valleculae;Lateral channel residue;Pharyngeal residue - pyriform        Pharyngeal - Solids   Pharyngeal- Puree Swallow initiation at vallecula;Pharyngeal residue - valleculae;Reduced tongue base retraction    Pharyngeal- Regular Delayed swallow initiation-vallecula;Reduced tongue base retraction;Pharyngeal residue - valleculae    Pharyngeal- Pill Penetration/Aspiration during swallow   Pt penetrated the thins when taking the pill   Pharyngeal Material enters airway, remains ABOVE vocal cords and not ejected out        Electrical Stimulation - Pharyngeal Phase   Was Electrical Stimulation Used No                 Cricopharyngeal Phase - 05/20/21 1509             Cervical Esophageal Phase   Cervical Esophageal Phase Within functional limits             ?      Plan - 05/20/21 1511      Clinical Impression Statement Pt presents with moderate oropharyngeal phase dysphagia characterized by impaired lingual movement resulting in reduced bolus cohesiveness, piecemeal deglutition, premature spillage, lingual residue; reduced tongue base retraction resulting in min vallecular residue, however oral residuals then spill to the valleculae and are inconsistently sensed and spill to lateral channels and pyriforms. Pt is at risk for aspiration of pooled residuals and needed cues to "swallow again" to clear his pharynx.  He took one sip of liquid, but swallowed three times to clear oral cavity and then needed cue to swallow again for residuals in the pharynx. Pt with penetration of thins during sequential cup sips of thin to swallow the barium tablet. This appeared to just reach the cords and he spontaneously cleared his throat which did not completely expel from laryngeal vestibule. He was cued to cough strongly, which then cleared the laryngeal vestibule. No significant pharyngeal residue noted  with purees and solids. Recommend D3/mech soft with treating SLP to advance to regular textures as clinically appropriate and thin liquids with cues to take small sips and complete "dry swallows" to clear pharynx. Pt does wear upper dentures and he indicates they fit him well, however SLP questions whether this impacts his feedback/proprioception with liquids in his oral cavity. SLP will fax report to [facility] [place].   Treatment/Interventions Patient/family education               Patient will benefit from skilled therapeutic intervention in order to improve the following deficits and impairments:  Dysphagia, oropharyngeal phase   ?      Recommendations/Treatment - 05/20/21 1509             Swallow Evaluation Recommendations   SLP Diet Recommendations Dysphagia 3 (mechanical soft);Age appropriate regular;Thin    Liquid Administration via Cup    Medication Administration Whole meds with puree    Supervision Patient able to self feed    Compensations Small sips/bites;Multiple dry swallows after each bite/sip;Effortful swallow    Postural Changes Seated upright at 90 degrees;Remain upright for at least 30 minutes after feeds/meals                 Prognosis - 05/20/21 1510             Prognosis   Prognosis for Safe Diet Advancement Fair    Barriers to Reach Goals Severity of deficits        Individuals Consulted   Consulted and Agree with Results and Recommendations Patient    Report Sent to  Primary SLP;Facility (Comment)                Problem List    Patient Active Problem List   Diagnosis Date Noted  Gastric ulcer 05/03/2021  Esophagitis 05/03/2021  Acute urinary retention 05/03/2021  Dysphagia 05/03/2021  CKD (chronic kidney disease), stage IV ([place]) 05/03/2021  SBO (small bowel obstruction) ([place]) 05/03/2021  Ileus ([place]) 05/03/2021  Aspiration pneumonia ([place]) 05/03/2021  Malnutrition of moderate degree 04/29/2021  Hypertension    Diabetes mellitus without complication ([place])    AKI (acute kidney injury) ([place]) 04/10/2021  Prostate cancer ([place]) 09/09/2020  General weakness 2019   Thank you,   Genene Churn, [place]   PORTER,DABNEY 05/20/2021, 3:12 PM   [place], [place], [address] Phone: [phone]   Fax:  [phone]   Name: Thomas Duarte MRN: [id number] Date of Birth: 19-Sep-1940      Electronically signed by Ephraim Hamburger, CCC-SLP at 05/20/2021  5:24 PM CLINICAL DATA:  Dysphagia EXAM: MODIFIED BARIUM SWALLOW TECHNIQUE: Different consistencies of barium were administered orally to the patient by the Speech Pathologist. Imaging of the pharynx was performed in the lateral projection. The radiologist was present in the fluoroscopy room for this study, providing personal supervision. FLUOROSCOPY TIME:  Fluoroscopy Time:  5 minutes 36 seconds Radiation Exposure Index (if provided by the fluoroscopic device): 24.8 mGy Number of Acquired Spot Images: multiple fluoroscopic screen captures COMPARISON:  None FINDINGS: With thin barium, vallecular and piriform sinus residuals are identified. No laryngeal penetration or aspiration. Vallecular and piriform sinus residuals were noted. Premature spillover of contrast to the vallecula was also seen. Applesauce consistency showed vallecular residuals without laryngeal penetration or aspiration. No abnormalities with cracker consistency. Patient swallowed a 12.5 mm diameter barium tablet which passed to the stomach without  obstruction. Patient experienced laryngeal penetration and  silent aspiration of a small amount of the thin barium utilized to swallow the barium tablet. IMPRESSION: Swallowing dysfunction as above. Please refer to the Speech Pathologists report for complete details and recommendations. Electronically Signed   By: Lavonia Dana M.D.   On: 05/20/2021 14:05    Procedures Procedures   Medications Ordered in ED Medications  apixaban (ELIQUIS) tablet 2.5 mg (has no administration in time range)  apixaban (ELIQUIS) tablet 2.5 mg (2.5 mg Oral Given 05/21/21 1809)    ED Course  I have reviewed the triage vital signs and the nursing notes.  Pertinent labs & imaging results that were available during my care of the patient were reviewed by me and considered in my medical decision making (see chart for details).    MDM Rules/Calculators/A&P                          Initial impression-patient presents with a positive DVT study.  He is alert, does not appear in acute stress, vital signs reassuring.  Will obtain basic lab work-up, ensure his hemoglobin is stabilized, has normal platelet count and reassess.  Work-up-CBC shows normocytic anemia with hemoglobin 8.1, BMP shows hyperglycemia of 118, BUN of 35, creatinine 1.93, prothrombin time unremarkable, INR unremarkable.  Hemoccult is negative  Reassessment-due to increase risk of bleed i.e. history of upper GI bleeds, CKD as well as over 79 will consult with pharmacy for further recommendations.  Spoke with patient about risks and benefits of anticoagulation, patient would like to be on an anticoagulant, we will have him follow-up with his PCP in 1 week's time for reevaluation.  Consult-spoke with Kelvin Cellar of pharmacy reviewed guidelines its states that patient could receive normal dose of Eliquis but it is a judgment call as patient does have increased risk factors could start patient on a half dose of Eliquis.  She does recommend speaking with GI for  further recommendations.  Spoke with Dr. Melony Overly of GI he feels since patient is hemodynamically stable currently on PPIs he was at safe for patient be on anticoagulant therapy.  Recommends close follow-up.  recommend performing a Hemoccult.  Rule out-I have low suspicion for PE as patient denies pleuritic chest pain, shortness of breath, he is nonhypoxic, nontachypneic, nontachycardic.  I have low suspicion for GI bleed as patient denies hematemesis, coffee-ground emesis, hematochezia or melena, he is hemodynamically stable, hemoglobin has remained stable, abdomen soft nontender to palpation.  low suspicion for limb ischemia as there is no skin changes no on my exam, neurovascular intact in lower extremities.  Plan-  DVT- will start patient on anticoagulants, will place him on the half dose of 2.5 as he is at an increased risk for bleeding.  I.e. history of upper GI bleeds, over 79, ckd.  will have him follow-up with his primary care doctor in 1 week's time for reevaluation of hemoglobin given strict return precautions.  We will also have him discontinue his clopidogrel as well as his aspirin for the duration of his anticoagulant.  Vital signs have remained stable, no indication for hospital admission.  Patient discussed with attending and they agreed with assessment and plan.  Patient given at home care as well strict return precautions.  Patient verbalized that they understood agreed to said plan.  Final Clinical Impression(s) / ED Diagnoses Final diagnoses:  DVT femoral (deep venous thrombosis) with thrombophlebitis, left ([place])    Rx / DC Orders ED Discharge Orders  Ordered    apixaban (ELIQUIS) 2.5 MG TABS tablet        05/21/21 2019             Aron Baba 05/21/21 2022    Lajean Saver, MD 05/22/21 1359

## 2021-05-21 NOTE — Discharge Instructions (Addendum)
You have a blood clot in your left leg I have started you on a blood thinner please take as prescribed.  This medication can increase your chances of bleeding if you have any minor injuries you will need Follow-up in the emergency department.  I have also discontinued your aspirin as well as your Plavix please not take these while taking your Eliquis.  Please follow-up with your primary care doctor in 1 week's time to recheck your hemoglobin.  If you see blood in your stool we have dark tarry stools please come to the emergency department for further evaluation.  Come back to the emergency department if you develop chest pain, shortness of breath, severe abdominal pain, uncontrolled nausea, vomiting, diarrhea.   ================================================================================================================= Information on my medicine - ELIQUIS (apixaban)  This medication education was reviewed with me or my healthcare representative as part of my discharge preparation.    Why was Eliquis prescribed for you? Eliquis was prescribed to treat blood clots that may have been found in the veins of your legs (deep vein thrombosis) or in your lungs (pulmonary embolism) and to reduce the risk of them occurring again.  What do You need to know about Eliquis ? Your starting dose is 5 mg (two 2.5 mg tablets) taken TWICE daily for the FIRST SEVEN (7) DAYS, then on (enter date)  05/28/21  the dose is reduced to ONE 2.5 mg tablet taken TWICE daily.  Eliquis may be taken with or without food.   Try to take the dose about the same time in the morning and in the evening. If you have difficulty swallowing the tablet whole please discuss with your pharmacist how to take the medication safely.  Take Eliquis exactly as prescribed and DO NOT stop taking Eliquis without talking to the doctor who prescribed the medication.  Stopping may increase your risk of developing a new blood clot.  Refill  your prescription before you run out.  After discharge, you should have regular check-up appointments with your healthcare provider that is prescribing your Eliquis.    What do you do if you miss a dose? If a dose of ELIQUIS is not taken at the scheduled time, take it as soon as possible on the same day and twice-daily administration should be resumed. The dose should not be doubled to make up for a missed dose.  Important Safety Information A possible side effect of Eliquis is bleeding. You should call your healthcare provider right away if you experience any of the following: Bleeding from an injury or your nose that does not stop. Unusual colored urine (red or dark brown) or unusual colored stools (red or black). Unusual bruising for unknown reasons. A serious fall or if you hit your head (even if there is no bleeding).  Some medicines may interact with Eliquis and might increase your risk of bleeding or clotting while on Eliquis. To help avoid this, consult your healthcare provider or pharmacist prior to using any new prescription or non-prescription medications, including herbals, vitamins, non-steroidal anti-inflammatory drugs (NSAIDs) and supplements.  This website has more information on Eliquis (apixaban): http://www.eliquis.com/eliquis/home

## 2021-09-12 IMAGING — DX DG ABD PORTABLE 1V
1 series · 2 of 2 positions shown · non-contrast
Comparison: None.

CLINICAL DATA: Vomiting, abdominal pressure, inpatient

EXAM:
PORTABLE ABDOMEN - 1 VIEW

[Series 1: abdomen · 0.14mm/px · 2 of 2 slices shown]
[im 1/2]
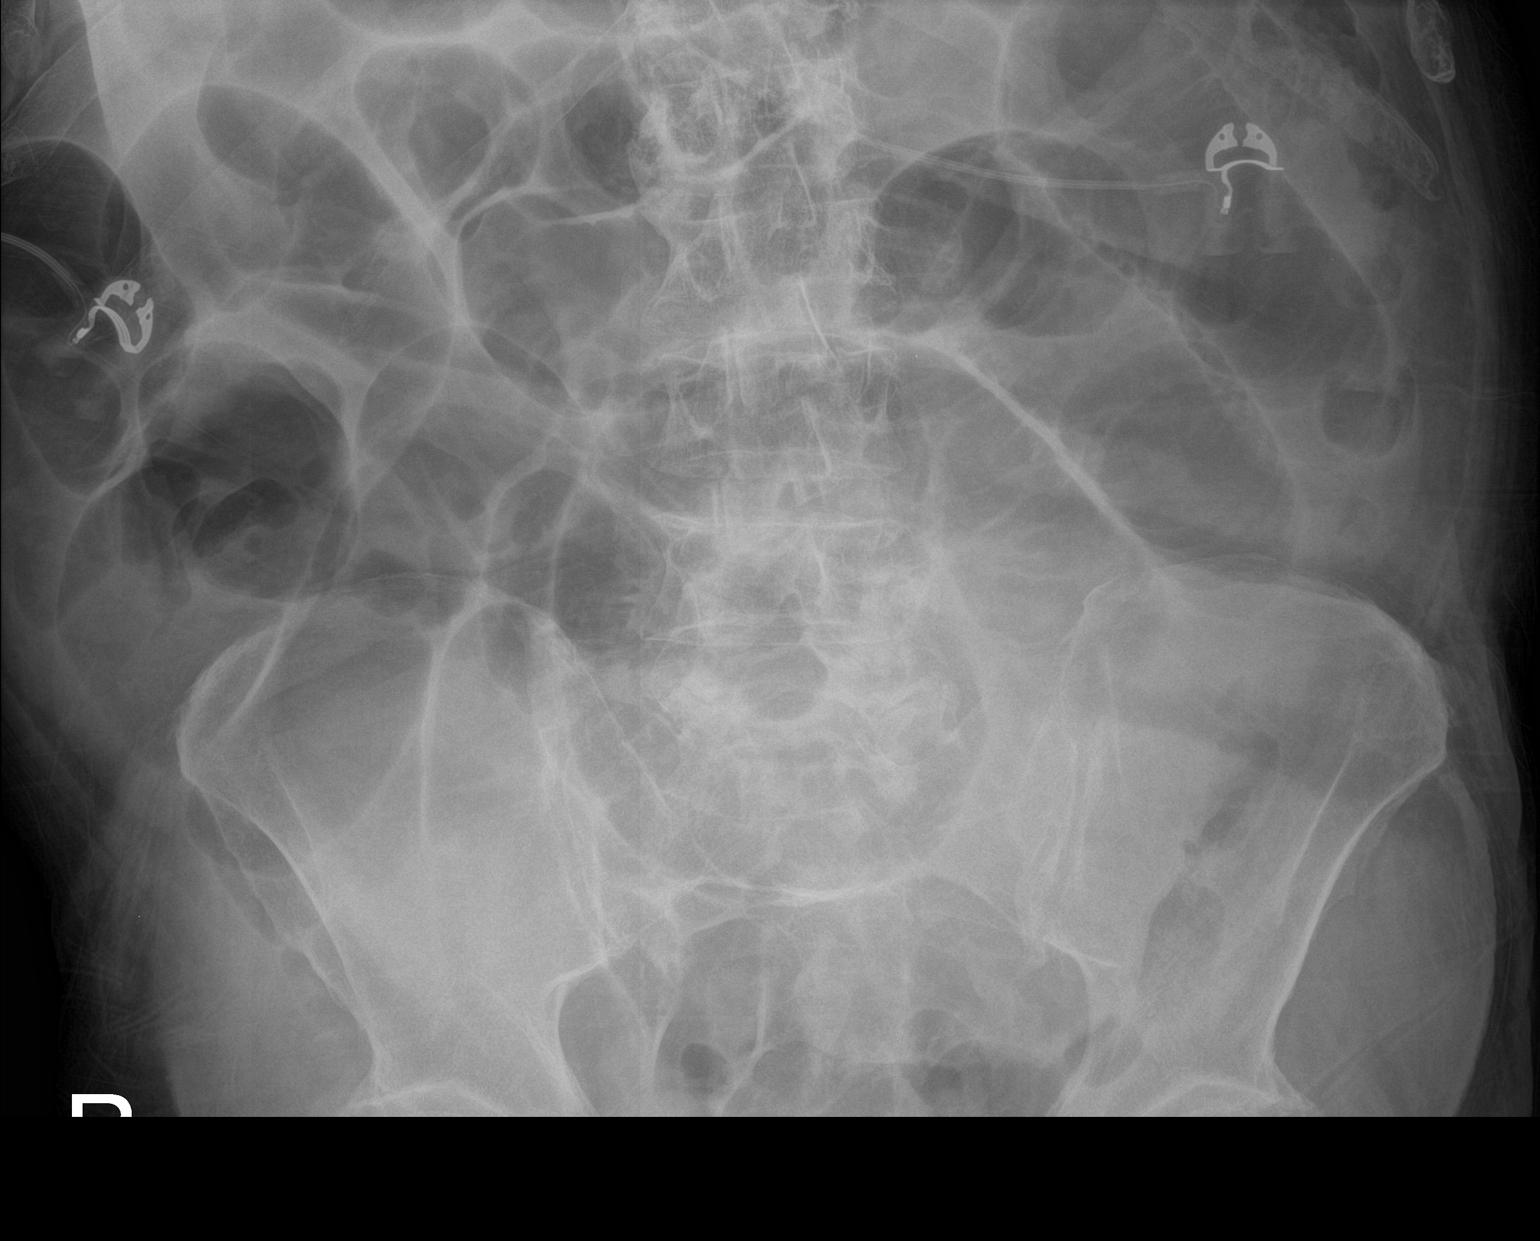
[im 2/2]
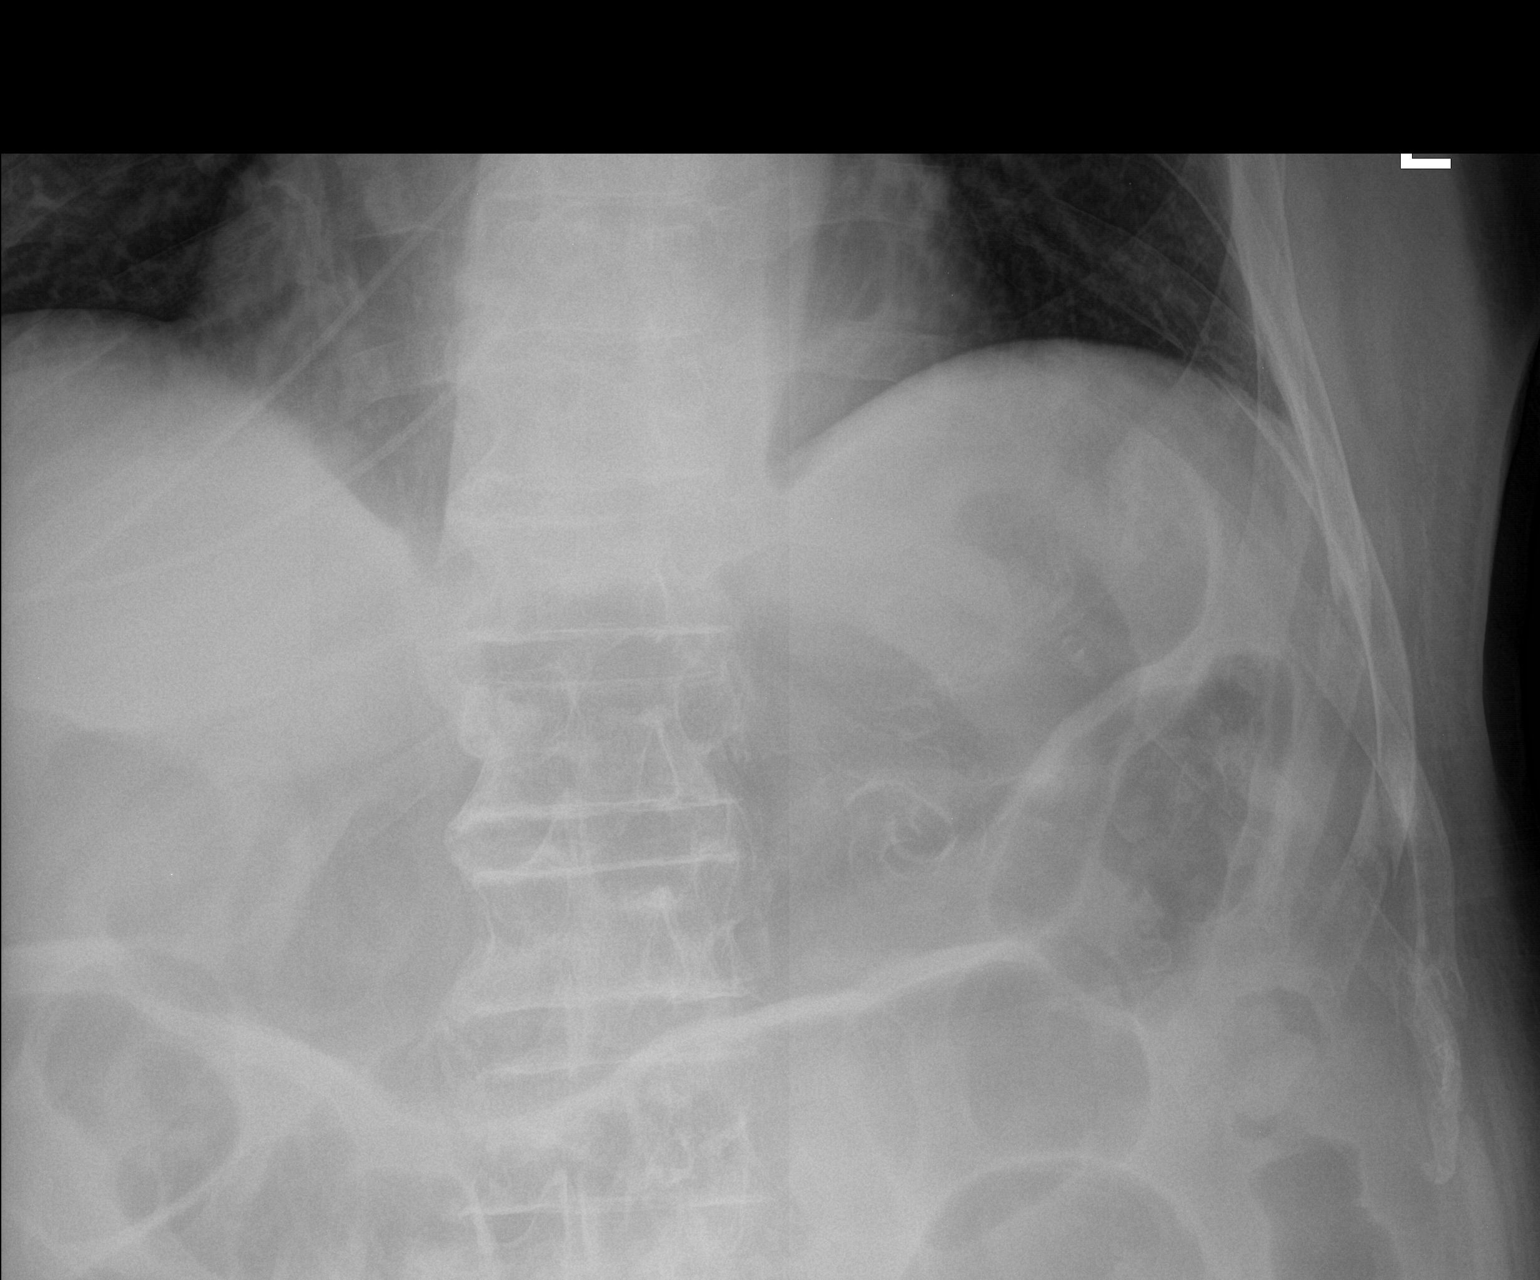

[2 of 2 positions shown; findings below may reference images not displayed]

FINDINGS: Moderately dilated small bowel loops throughout the central abdomen
up to the 5.0 cm diameter. Mild colonic stool and gas. No evidence
of pneumatosis or pneumoperitoneum. Mild gastric distension. Clear
lung bases. No radiopaque nephrolithiasis. Moderate lumbar
spondylosis.
IMPRESSION: Moderately dilated small bowel loops throughout the central abdomen
and mild gastric distension, suspicious for distal small bowel
obstruction. No evidence of pneumatosis or pneumoperitoneum. CT
abdomen/pelvis with oral and IV contrast may be obtained for further
evaluation as clinically warranted.

## 2021-09-17 IMAGING — DX DG ABD PORTABLE 1V
2 series · 2 of 2 positions shown · non-contrast
Comparison: Yesterday

CLINICAL DATA: Weakness and fatigue. Evaluate for small bowel
obstruction.

EXAM:
PORTABLE ABDOMEN - 1 VIEW

[abdomen supine (1 of 2)]
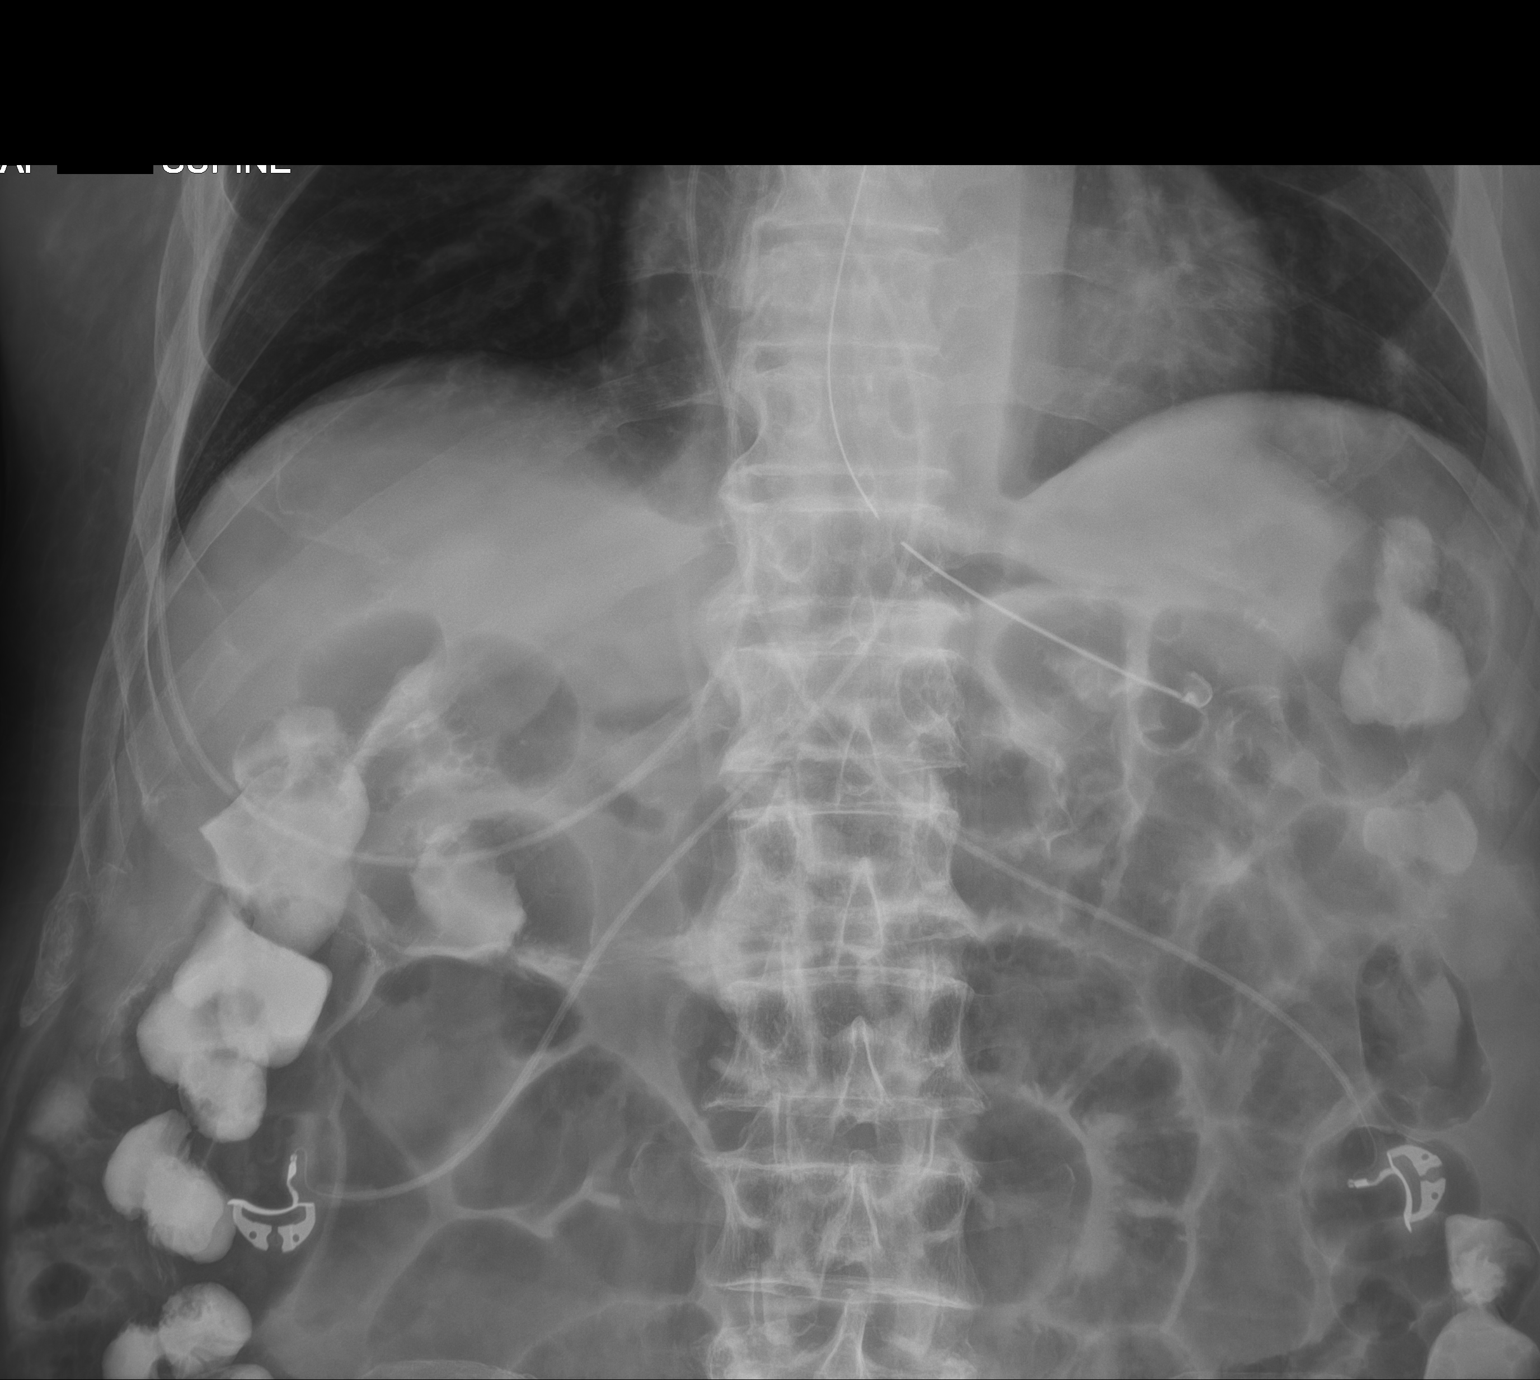

[abdomen supine (2 of 2)]
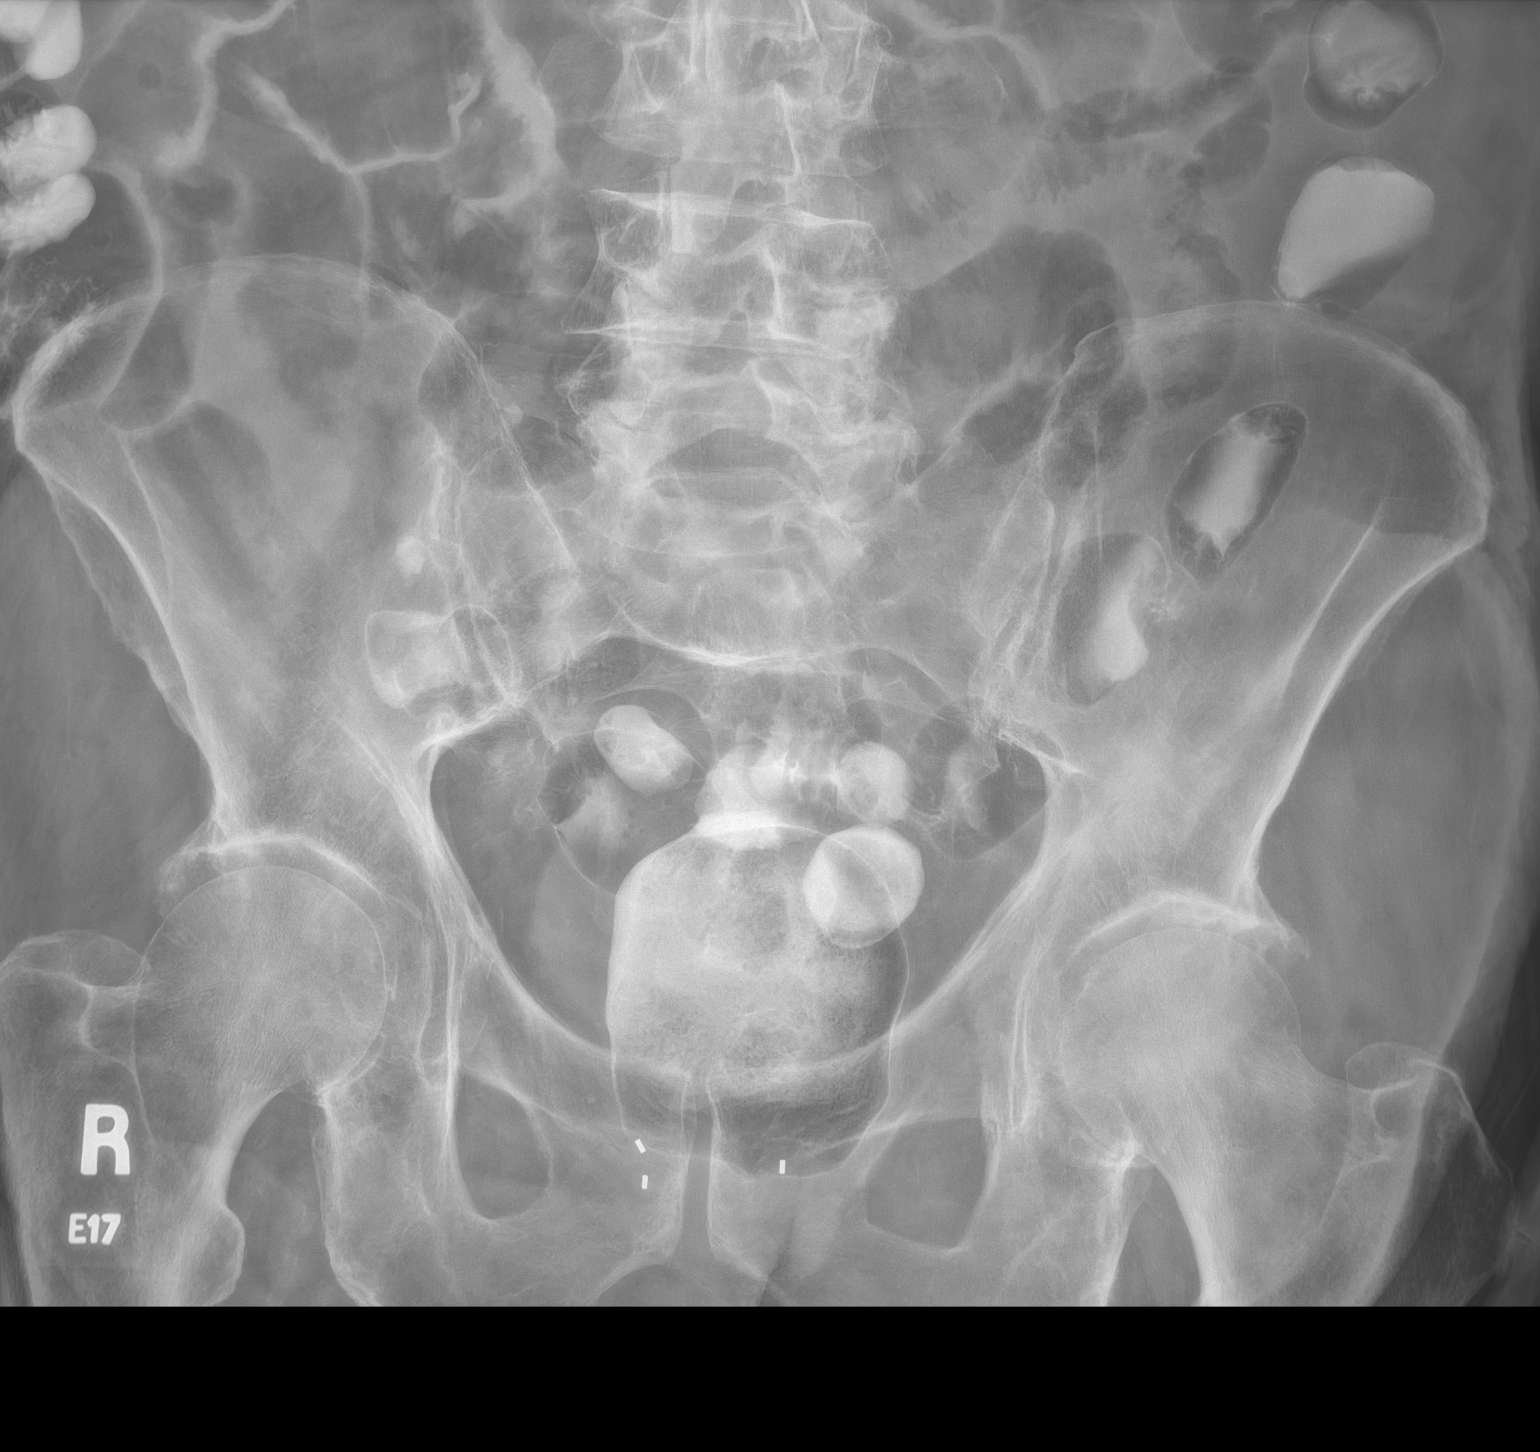

[2 of 2 positions shown; findings below may reference images not displayed]

FINDINGS: Nasogastric tube terminates in the stomach with side port just above
the gastroesophageal junction, similar.

On both supine views, no gross free intraperitoneal air is seen.
Contrast within normal caliber colon. Gas-filled small bowel loops
are upper normal caliber, without focal transition identified.
IMPRESSION: Upper normal gas-filled small bowel loops, favoring adynamic ileus.
Further passage of contrast into the distal colon.

## 2021-09-18 IMAGING — DX DG ABD PORTABLE 1V
1 series · 1 of 1 positions shown · non-contrast
Comparison: 04/27/2021

CLINICAL DATA: Enteric catheter placement

EXAM:
PORTABLE ABDOMEN - 1 VIEW

[abdomen]
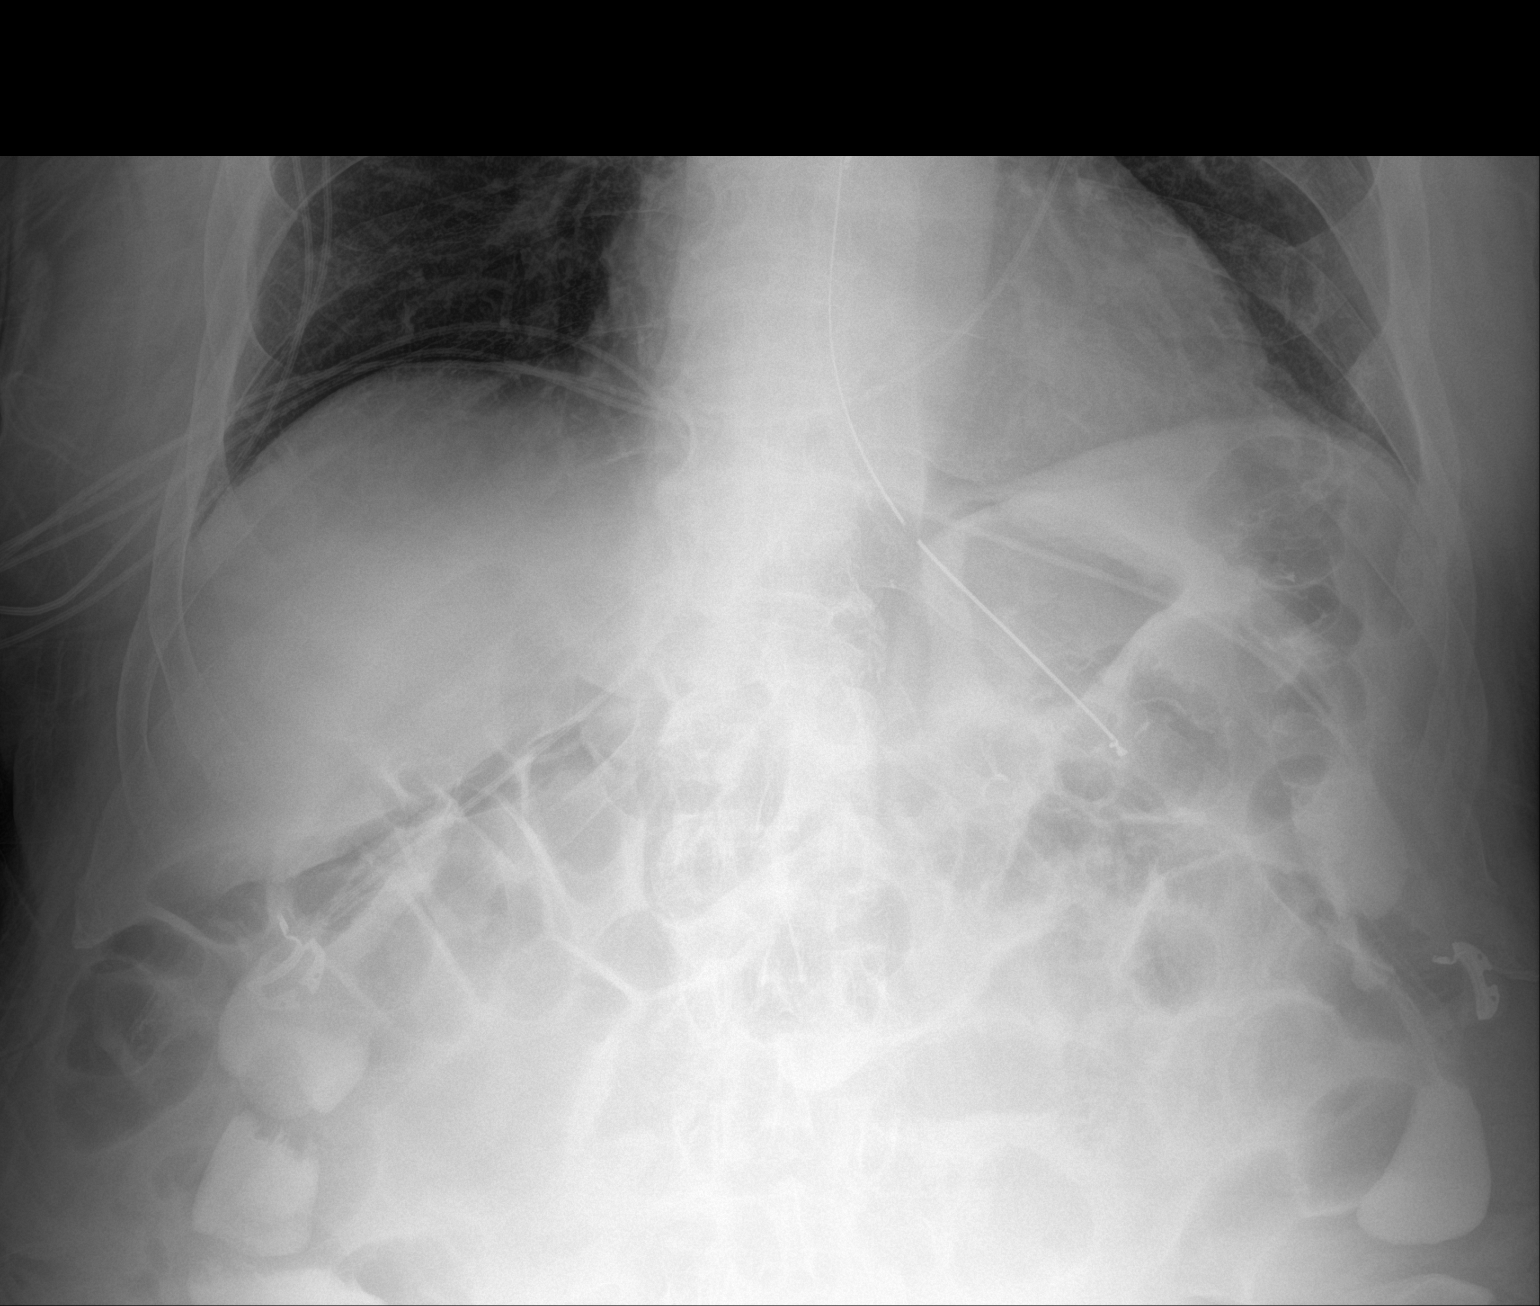

[1 of 1 positions shown; findings below may reference images not displayed]

FINDINGS: Frontal view of the lower chest and upper abdomen demonstrates
slight advancement of the enteric catheter, tip projecting over the
gastric fundus and side port projecting at the gastroesophageal
junction. Continued nonspecific gaseous distention of the bowel.
Lung bases are clear.
IMPRESSION: 1. Enteric catheter as above, side port projecting at the
gastroesophageal junction.

## 2021-09-18 IMAGING — DX DG ABD PORTABLE 1V
1 series · 1 of 1 positions shown · non-contrast
Comparison: April 26, 2021

CLINICAL DATA: NG tube placement.

EXAM:
PORTABLE ABDOMEN - 1 VIEW

[abdomen kub]
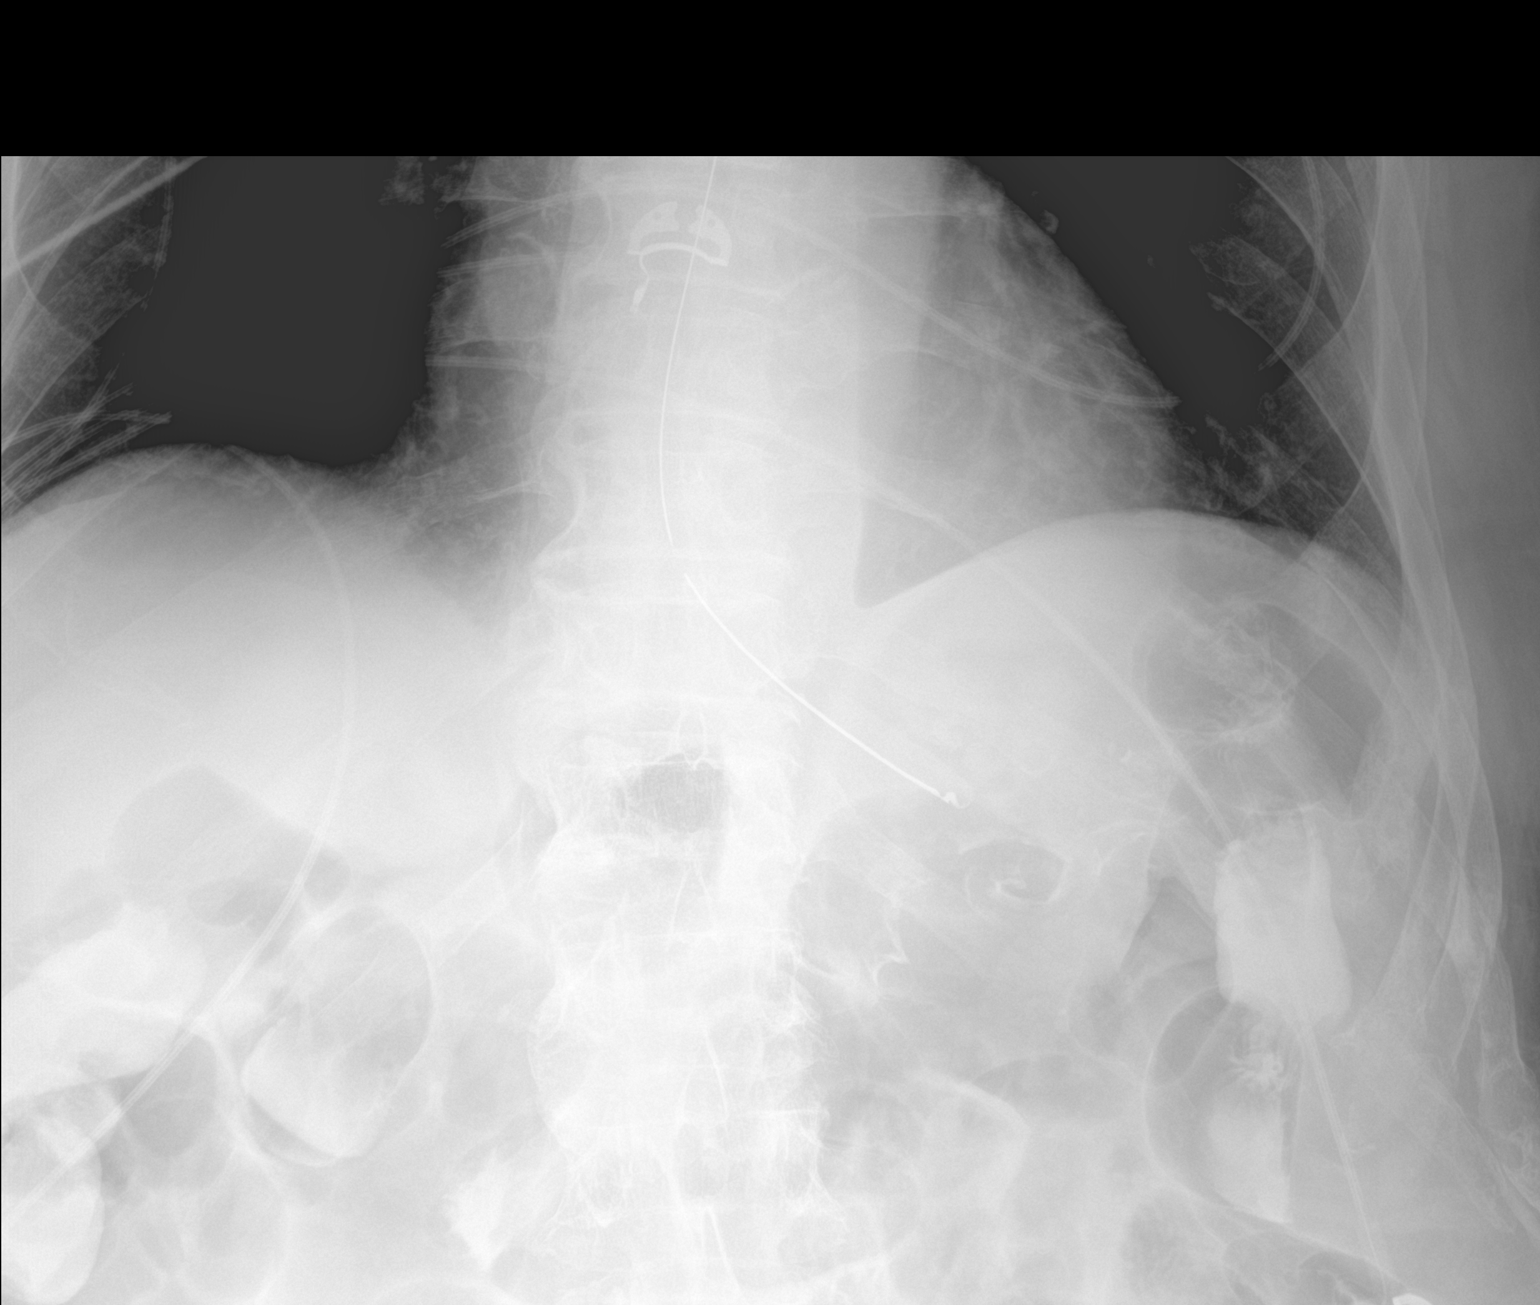

[1 of 1 positions shown; findings below may reference images not displayed]

FINDINGS: Limited view of the upper abdomen demonstrates enteric catheter with
tip in the expected location of the gastric cardia. The side hole is
proximal to the expected location of the GE junction.

Nonspecific bowel gas pattern.
IMPRESSION: 1. Enteric catheter with tip in the expected location of the gastric
cardia, with side hole proximal to the GE junction. Consider
advancement.

## 2022-01-15 DEATH — deceased
